# Patient Record
Sex: Female | Born: 1980 | Race: White | Hispanic: No | Marital: Married | Smoking: Former smoker
Health system: Southern US, Community
[De-identification: ages and names within clinical notes are randomized; demographics above are authoritative.]

## PROBLEM LIST (undated history)

## (undated) ENCOUNTER — Inpatient Hospital Stay (HOSPITAL_COMMUNITY): Payer: Self-pay

## (undated) DIAGNOSIS — K829 Disease of gallbladder, unspecified: Secondary | ICD-10-CM

## (undated) DIAGNOSIS — D649 Anemia, unspecified: Secondary | ICD-10-CM

## (undated) DIAGNOSIS — R7303 Prediabetes: Secondary | ICD-10-CM

## (undated) DIAGNOSIS — Z3490 Encounter for supervision of normal pregnancy, unspecified, unspecified trimester: Secondary | ICD-10-CM

## (undated) DIAGNOSIS — N949 Unspecified condition associated with female genital organs and menstrual cycle: Secondary | ICD-10-CM

## (undated) DIAGNOSIS — IMO0002 Reserved for concepts with insufficient information to code with codable children: Secondary | ICD-10-CM

## (undated) DIAGNOSIS — Z8719 Personal history of other diseases of the digestive system: Secondary | ICD-10-CM

## (undated) DIAGNOSIS — E079 Disorder of thyroid, unspecified: Secondary | ICD-10-CM

## (undated) DIAGNOSIS — N926 Irregular menstruation, unspecified: Secondary | ICD-10-CM

## (undated) DIAGNOSIS — R87629 Unspecified abnormal cytological findings in specimens from vagina: Secondary | ICD-10-CM

## (undated) DIAGNOSIS — R87619 Unspecified abnormal cytological findings in specimens from cervix uteri: Secondary | ICD-10-CM

## (undated) DIAGNOSIS — F419 Anxiety disorder, unspecified: Secondary | ICD-10-CM

## (undated) DIAGNOSIS — N898 Other specified noninflammatory disorders of vagina: Secondary | ICD-10-CM

## (undated) DIAGNOSIS — Z319 Encounter for procreative management, unspecified: Secondary | ICD-10-CM

## (undated) DIAGNOSIS — K219 Gastro-esophageal reflux disease without esophagitis: Secondary | ICD-10-CM

## (undated) HISTORY — DX: Unspecified condition associated with female genital organs and menstrual cycle: N94.9

## (undated) HISTORY — PX: WISDOM TOOTH EXTRACTION: SHX21

## (undated) HISTORY — DX: Disorder of thyroid, unspecified: E07.9

## (undated) HISTORY — DX: Disease of gallbladder, unspecified: K82.9

## (undated) HISTORY — DX: Unspecified abnormal cytological findings in specimens from cervix uteri: R87.619

## (undated) HISTORY — DX: Prediabetes: R73.03

## (undated) HISTORY — DX: Anemia, unspecified: D64.9

## (undated) HISTORY — DX: Encounter for procreative management, unspecified: Z31.9

## (undated) HISTORY — PX: UPPER GASTROINTESTINAL ENDOSCOPY: SHX188

## (undated) HISTORY — DX: Irregular menstruation, unspecified: N92.6

## (undated) HISTORY — DX: Other specified noninflammatory disorders of vagina: N89.8

## (undated) HISTORY — DX: Gastro-esophageal reflux disease without esophagitis: K21.9

## (undated) HISTORY — DX: Encounter for supervision of normal pregnancy, unspecified, unspecified trimester: Z34.90

## (undated) HISTORY — DX: Unspecified abnormal cytological findings in specimens from vagina: R87.629

## (undated) HISTORY — DX: Reserved for concepts with insufficient information to code with codable children: IMO0002

---

## 2007-03-07 ENCOUNTER — Other Ambulatory Visit: Admission: RE | Admit: 2007-03-07 | Discharge: 2007-03-07 | Payer: Self-pay | Admitting: Obstetrics and Gynecology

## 2007-12-03 ENCOUNTER — Emergency Department (HOSPITAL_COMMUNITY): Admission: EM | Admit: 2007-12-03 | Discharge: 2007-12-04 | Payer: Self-pay | Admitting: Emergency Medicine

## 2008-04-02 ENCOUNTER — Other Ambulatory Visit: Admission: RE | Admit: 2008-04-02 | Discharge: 2008-04-02 | Payer: Self-pay | Admitting: Obstetrics and Gynecology

## 2008-10-22 ENCOUNTER — Emergency Department (HOSPITAL_COMMUNITY): Admission: EM | Admit: 2008-10-22 | Discharge: 2008-10-22 | Payer: Self-pay | Admitting: Emergency Medicine

## 2009-06-18 ENCOUNTER — Other Ambulatory Visit: Admission: RE | Admit: 2009-06-18 | Discharge: 2009-06-18 | Payer: Self-pay | Admitting: Obstetrics and Gynecology

## 2010-10-08 LAB — URINALYSIS, ROUTINE W REFLEX MICROSCOPIC
Bilirubin Urine: NEGATIVE
Hgb urine dipstick: NEGATIVE
Nitrite: NEGATIVE
Specific Gravity, Urine: <1.005 — ABNORMAL LOW (ref 1.005–1.030)
Urobilinogen, UA: 0.2 mg/dL (ref 0.0–1.0)
pH: 6.5 (ref 5.0–8.0)

## 2010-11-12 ENCOUNTER — Other Ambulatory Visit (HOSPITAL_COMMUNITY)
Admission: RE | Admit: 2010-11-12 | Discharge: 2010-11-12 | Disposition: A | Discharge status: Home / Self Care | Payer: BC Managed Care – PPO | Source: Ambulatory Visit | Attending: Obstetrics and Gynecology | Admitting: Obstetrics and Gynecology

## 2010-11-12 DIAGNOSIS — Z01419 Encounter for gynecological examination (general) (routine) without abnormal findings: Secondary | ICD-10-CM | POA: Insufficient documentation

## 2011-03-26 LAB — DIFFERENTIAL
Eosinophils Relative: 3
Lymphocytes Relative: 31
Monocytes Absolute: 0.6
Monocytes Relative: 7
Neutro Abs: 5

## 2011-03-26 LAB — BASIC METABOLIC PANEL
CO2: 25
Calcium: 9.4
GFR calc Af Amer: >60
GFR calc non Af Amer: >60
Glucose, Bld: 109 — ABNORMAL HIGH
Potassium: 3.7
Sodium: 139

## 2011-03-26 LAB — CBC
HCT: 35.8 — ABNORMAL LOW
Hemoglobin: 12.8
RBC: 4.16

## 2011-12-30 ENCOUNTER — Other Ambulatory Visit (HOSPITAL_COMMUNITY)
Admission: RE | Admit: 2011-12-30 | Discharge: 2011-12-30 | Disposition: A | Discharge status: Home / Self Care | Payer: BC Managed Care – PPO | Source: Ambulatory Visit | Attending: Obstetrics and Gynecology | Admitting: Obstetrics and Gynecology

## 2011-12-30 DIAGNOSIS — Z1159 Encounter for screening for other viral diseases: Secondary | ICD-10-CM | POA: Insufficient documentation

## 2011-12-30 DIAGNOSIS — Z01419 Encounter for gynecological examination (general) (routine) without abnormal findings: Secondary | ICD-10-CM | POA: Insufficient documentation

## 2012-06-20 NOTE — H&P (Signed)
  NTS SOAP Note  Vital Signs:  Vitals as of: 06/20/2012: Systolic 136: Diastolic 88: Heart Rate 83: Temp 97.17F: Height 71ft 2in: Weight 114Lbs 0 Ounces: Pain Level 4: BMI 21  BMI : 20.85 kg/m2  Subjective: This 31 Years 65 Months old Female presents for of    ABDOMINAL PAIN : ,Has been having right upper quadrant abdominal pain radiating to the right flank for some time now.  Made worse with fatty food.  No fever, chills, jaundice.  U/S of gallbladder negative.  HIDA scan shows low gallbladder ejection fraction.  Review of Symptoms:  Constitutional:unremarkable   Head:unremarkable    Eyes:unremarkable   sinus problems Cardiovascular:  unremarkable   Respiratory:unremarkable   Gastrointestin    abdominal pain,nausea,vomiting,heartburn Genitourinary:unremarkable     Musculoskeletal:unremarkable   Skin:unremarkable Hematolgic/Lymphatic:unremarkable     Allergic/Immunologic:unremarkable     Past Medical History:    Reviewed   Past Medical History  Surgical History: none Medical Problems: reflux Allergies: pcn Medications: flonase, birth control, zantac, ativan   Social History:Reviewed  Social History  Age: 31 Years 6 Months Alcohol: socially Recreational drug(s):  No   Smoking Status: Never smoker reviewed on 06/16/2012 Functional Status reviewed on mm/dd/yyyy ------------------------------------------------ Bathing: Normal Cooking: Normal Dressing: Normal Driving: Normal Eating: Normal Managing Meds: Normal Oral Care: Normal Shopping: Normal Toileting: Normal Transferring: Normal Walking: Normal Cognitive Status reviewed on mm/dd/yyyy ------------------------------------------------ Attention: Normal Decision Making: Normal Language: Normal Memory: Normal Motor: Normal Perception: Normal Problem Solving: Normal Visual and Spatial: Normal   Family History:  Reviewed   Family History  Is there a family  history of:DM    Objective Information: General:  Well appearing, well nourished in no distress.   no scleral icterus Heart:  RRR, no murmur Lungs:    CTA bilaterally, no wheezes, rhonchi, rales.  Breathing unlabored. Abdomen:Soft, NT/ND, no HSM, no masses.  Assessment:Chronic cholecystitis  Diagnosis &amp; Procedure:    Plan:  Scheduled for laparoscopic cholecystectomy on 07/20/12.   Patient Education:Alternative treatments to surgery were discussed with patient (and family).  Risks and benefits  of procedure including bleeding, infection, hepatobiliary injury, and the possibility of an open procedure were fully explained to the patient (and family) who gave informed consent. Patient/family questions were addressed.  Follow-up:Pending Surgery

## 2012-07-05 ENCOUNTER — Encounter (HOSPITAL_COMMUNITY): Payer: Self-pay | Admitting: Pharmacy Technician

## 2012-07-14 ENCOUNTER — Encounter (HOSPITAL_COMMUNITY): Payer: Self-pay

## 2012-07-14 ENCOUNTER — Encounter (HOSPITAL_COMMUNITY)
Admission: RE | Admit: 2012-07-14 | Discharge: 2012-07-14 | Disposition: A | Discharge status: Home / Self Care | Payer: BC Managed Care – PPO | Source: Ambulatory Visit | Attending: General Surgery | Admitting: General Surgery

## 2012-07-14 DIAGNOSIS — Z01812 Encounter for preprocedural laboratory examination: Secondary | ICD-10-CM | POA: Insufficient documentation

## 2012-07-14 DIAGNOSIS — K811 Chronic cholecystitis: Secondary | ICD-10-CM | POA: Insufficient documentation

## 2012-07-14 DIAGNOSIS — Z5320 Procedure and treatment not carried out because of patient's decision for unspecified reasons: Secondary | ICD-10-CM | POA: Insufficient documentation

## 2012-07-14 HISTORY — DX: Anxiety disorder, unspecified: F41.9

## 2012-07-14 HISTORY — DX: Gastro-esophageal reflux disease without esophagitis: K21.9

## 2012-07-14 HISTORY — DX: Personal history of other diseases of the digestive system: Z87.19

## 2012-07-14 LAB — BASIC METABOLIC PANEL
BUN: 11 mg/dL (ref 6–23)
CO2: 26 mEq/L (ref 19–32)
Chloride: 100 mEq/L (ref 96–112)
Creatinine, Ser: 0.94 mg/dL (ref 0.50–1.10)
Glucose, Bld: 103 mg/dL — ABNORMAL HIGH (ref 70–99)

## 2012-07-14 LAB — CBC WITH DIFFERENTIAL/PLATELET
Basophils Absolute: 0 10*3/uL (ref 0.0–0.1)
Eosinophils Absolute: 0.4 10*3/uL (ref 0.0–0.7)
Eosinophils Relative: 5 % (ref 0–5)
HCT: 39 % (ref 36.0–46.0)
Lymphocytes Relative: 27 % (ref 12–46)
MCH: 29.4 pg (ref 26.0–34.0)
MCV: 86.3 fL (ref 78.0–100.0)
Monocytes Absolute: 0.7 10*3/uL (ref 0.1–1.0)
RDW: 12.8 % (ref 11.5–15.5)
WBC: 8.9 10*3/uL (ref 4.0–10.5)

## 2012-07-14 LAB — HEPATIC FUNCTION PANEL
ALT: 9 U/L (ref 0–35)
AST: 14 U/L (ref 0–37)
Albumin: 3.9 g/dL (ref 3.5–5.2)
Alkaline Phosphatase: 66 U/L (ref 39–117)
Total Protein: 7 g/dL (ref 6.0–8.3)

## 2012-07-14 LAB — HCG, SERUM, QUALITATIVE: Preg, Serum: NEGATIVE

## 2012-07-14 NOTE — Patient Instructions (Addendum)
GAYTHA RAYBOURN  07/14/2012   Your procedure is scheduled on:  07/20/2012  Report to [facility] at  700  AM.  Call this number if you have problems the morning of surgery: [phone]   Remember:   Do not eat food or drink liquids after midnight.   Take these medicines the morning of surgery with A SIP OF WATER: ativan,prilosec,zantac   Do not wear jewelry, make-up or nail polish.  Do not wear lotions, powders, or perfumes.   Do not shave 48 hours prior to surgery. Men may shave face and neck.  Do not bring valuables to the hospital.  Contacts, dentures or bridgework may not be worn into surgery.  Leave suitcase in the car. After surgery it may be brought to your room.  For patients admitted to the hospital, checkout time is 11:00 AM the day of discharge.   Patients discharged the day of surgery will not be allowed to drive  home.  Name and phone number of your driver: family  Special Instructions: Shower using CHG 2 nights before surgery and the night before surgery.  If you shower the day of surgery use CHG.  Use special wash - you have one bottle of CHG for all showers.  You should use approximately 1/3 of the bottle for each shower.   Please read over the following fact sheets that you were given: Pain Booklet, Coughing and Deep Breathing, MRSA Information, Surgical Site Infection Prevention, Anesthesia Post-op Instructions and Care and Recovery After Surgery Laparoscopic Cholecystectomy Laparoscopic cholecystectomy is surgery to remove the gallbladder. The gallbladder is located slightly to the right of center in the abdomen, behind the liver. It is a concentrating and storage sac for the bile produced in the liver. Bile aids in the digestion and absorption of fats. Gallbladder disease (cholecystitis) is an inflammation of your gallbladder. This condition is usually caused by a buildup of gallstones (cholelithiasis) in your gallbladder. Gallstones can block the flow of bile,  resulting in inflammation and pain. In severe cases, emergency surgery may be required. When emergency surgery is not required, you will have time to prepare for the procedure. Laparoscopic surgery is an alternative to open surgery. Laparoscopic surgery usually has a shorter recovery time. Your common bile duct may also need to be examined and explored. Your caregiver will discuss this with you if he or she feels this should be done. If stones are found in the common bile duct, they may be removed. LET YOUR CAREGIVER KNOW ABOUT:  Allergies to food or medicine.  Medicines taken, including vitamins, herbs, eyedrops, over-the-counter medicines, and creams.  Use of steroids (by mouth or creams).  Previous problems with anesthetics or numbing medicines.  History of bleeding problems or blood clots.  Previous surgery.  Other health problems, including diabetes and kidney problems.  Possibility of pregnancy, if this applies. RISKS AND COMPLICATIONS All surgery is associated with risks. Some problems that may occur following this procedure include:  Infection.  Damage to the common bile duct, nerves, arteries, veins, or other internal organs such as the stomach or intestines.  Bleeding.  A stone may remain in the common bile duct. BEFORE THE PROCEDURE  Do not take aspirin for 3 days prior to surgery or blood thinners for 1 week prior to surgery.  Do not eat or drink anything after midnight the night before surgery.  Let your caregiver know if you develop a cold or other infectious problem prior to surgery.  You should be present 60 minutes before the procedure or as directed. PROCEDURE  You will be given medicine that makes you sleep (general anesthetic). When you are asleep, your surgeon will make several small cuts (incisions) in your abdomen. One of these incisions is used to insert a small, lighted scope (laparoscope) into the abdomen. The laparoscope helps the surgeon see into  your abdomen. Carbon dioxide gas will be pumped into your abdomen. The gas allows more room for the surgeon to perform your surgery. Other operating instruments are inserted through the other incisions. Laparoscopic procedures may not be appropriate when:  There is major scarring from previous surgery.  The gallbladder is extremely inflamed.  There are bleeding disorders or unexpected cirrhosis of the liver.  A pregnancy is near term.  Other conditions make the laparoscopic procedure impossible. If your surgeon feels it is not safe to continue with a laparoscopic procedure, he or she will perform an open abdominal procedure. In this case, the surgeon will make an incision to open the abdomen. This gives the surgeon a larger view and field to work within. This may allow the surgeon to perform procedures that sometimes cannot be performed with a laparoscope alone. Open surgery has a longer recovery time. AFTER THE PROCEDURE  You will be taken to the recovery area where a nurse will watch and check your progress.  You may be allowed to go home the same day.  Do not resume physical activities until directed by your caregiver.  You may resume a normal diet and activities as directed. Document Released: 06/15/2005 Document Revised: 09/07/2011 Document Reviewed: 11/28/2010 [facility] Patient Information 2013 [place], [place]. PATIENT INSTRUCTIONS POST-ANESTHESIA  IMMEDIATELY FOLLOWING SURGERY:  Do not drive or operate machinery for the first twenty four hours after surgery.  Do not make any important decisions for twenty four hours after surgery or while taking narcotic pain medications or sedatives.  If you develop intractable nausea and vomiting or a severe headache please notify your doctor immediately.  FOLLOW-UP:  Please make an appointment with your surgeon as instructed. You do not need to follow up with anesthesia unless specifically instructed to do so.  WOUND CARE INSTRUCTIONS (if  applicable):  Keep a dry clean dressing on the anesthesia/puncture wound site if there is drainage.  Once the wound has quit draining you may leave it open to air.  Generally you should leave the bandage intact for twenty four hours unless there is drainage.  If the epidural site drains for more than 36-48 hours please call the anesthesia department.  QUESTIONS?:  Please feel free to call your physician or the hospital operator if you have any questions, and they will be happy to assist you.

## 2012-07-19 NOTE — OR Nursing (Signed)
Pt. Came for PAT workup. Then decided that she wants to wait until Summer to have procedure done.

## 2012-07-20 ENCOUNTER — Ambulatory Visit (HOSPITAL_COMMUNITY): Admission: RE | Admit: 2012-07-20 | Payer: BC Managed Care – PPO | Source: Ambulatory Visit | Admitting: General Surgery

## 2012-07-20 SURGERY — LAPAROSCOPIC CHOLECYSTECTOMY
Anesthesia: General

## 2013-01-02 ENCOUNTER — Other Ambulatory Visit: Payer: Self-pay | Admitting: Adult Health

## 2013-02-02 ENCOUNTER — Other Ambulatory Visit: Payer: Self-pay | Admitting: Adult Health

## 2013-03-16 ENCOUNTER — Other Ambulatory Visit: Payer: Self-pay | Admitting: Adult Health

## 2013-03-28 ENCOUNTER — Ambulatory Visit (INDEPENDENT_AMBULATORY_CARE_PROVIDER_SITE_OTHER): Payer: BC Managed Care – PPO | Admitting: Adult Health

## 2013-03-28 ENCOUNTER — Encounter: Payer: Self-pay | Admitting: Adult Health

## 2013-03-28 VITALS — BP 120/80 | HR 70 | Ht 62.0 in | Wt 114.0 lb

## 2013-03-28 DIAGNOSIS — Z01419 Encounter for gynecological examination (general) (routine) without abnormal findings: Secondary | ICD-10-CM

## 2013-03-28 NOTE — Patient Instructions (Addendum)
Physical in 1 year Take folic acid 800 mcg

## 2013-03-28 NOTE — Progress Notes (Signed)
Patient ID: Mindy Bright, female   DOB: 1980/09/16, 32 y.o.   MRN: [id number] History of Present Illness: Mindy Bright is a 32 year old white female marred in for physical. Had normal pap with negative HPV on 12/29/12.  Current Medications, Allergies, Past Medical History, Past Surgical History, Family History and Social History were reviewed in [employer] record.     Review of Systems: Patient denies any headaches, blurred vision, shortness of breath, chest pain, abdominal pain, problems with bowel movements, urination, or intercourse. No joint pain or mood changes,has been off OCs since June,ok if gets pregnant.    Physical Exam:BP 120/80  Pulse 70  Ht 5\' 2"  (1.575 m)  Wt 114 lb (51.71 kg)  BMI 20.85 kg/m2  LMP 03/14/2013 General:  Well developed, well nourished, no acute distress Skin:  Warm and dry Neck:  Midline trachea, normal thyroid Lungs; Clear to auscultation bilaterally Breast:  No dominant palpable mass, retraction, or nipple discharge Cardiovascular: Regular rate and rhythm Abdomen:  Soft, non tender, no hepatosplenomegaly Pelvic:  External genitalia is normal in appearance.  The vagina is normal in appearance. The cervix is nulliparous.  Uterus is felt to be normal size, shape, and contour.  No adnexal masses or tenderness noted. Extremities:  No swelling or varicosities noted Psych:  No mood changes, alert and cooperative seems happy   Impression: Yearly exam no pap     Plan: Physical in 1 year  Labs in near future fasting Take 800 mcg folic acid

## 2013-12-11 ENCOUNTER — Ambulatory Visit (INDEPENDENT_AMBULATORY_CARE_PROVIDER_SITE_OTHER): Payer: BC Managed Care – PPO | Admitting: Adult Health

## 2013-12-11 ENCOUNTER — Encounter: Payer: Self-pay | Admitting: Adult Health

## 2013-12-11 VITALS — BP 104/56 | Ht 62.0 in | Wt 115.0 lb

## 2013-12-11 DIAGNOSIS — N949 Unspecified condition associated with female genital organs and menstrual cycle: Secondary | ICD-10-CM

## 2013-12-11 DIAGNOSIS — Z319 Encounter for procreative management, unspecified: Secondary | ICD-10-CM

## 2013-12-11 DIAGNOSIS — N898 Other specified noninflammatory disorders of vagina: Secondary | ICD-10-CM

## 2013-12-11 HISTORY — DX: Encounter for procreative management, unspecified: Z31.9

## 2013-12-11 HISTORY — DX: Unspecified condition associated with female genital organs and menstrual cycle: N94.9

## 2013-12-11 HISTORY — DX: Other specified noninflammatory disorders of vagina: N89.8

## 2013-12-11 LAB — POCT WET PREP (WET MOUNT): WBC WET PREP: POSITIVE

## 2013-12-11 NOTE — Progress Notes (Signed)
Subjective:     Patient ID: Mindy Bright, female   DOB: Aug 03, 1980, 33 y.o.   MRN: [id number]  HPI Mindy KaufmannMelissa is a 33 year old white female, married in complaining of pressure/pain right ovary with orgasm,x 3-4 months,pain in ovary area with periods.Has discharge, no itching or odor,she wants to get pregnant in August.  Review of Systems See HPi Reviewed past medical,surgical, social and family history. Reviewed medications and allergies.     Objective:   Physical Exam BP 104/56  Ht 5\' 2"  (1.575 m)  Wt 115 lb (52.164 kg)  BMI 21.03 kg/m2  LMP 11/20/2013   Skin warm and dry.Pelvic: external genitalia is normal in appearance, vagina: white discharge without odor, cervix:smooth, uterus: normal size, shape and contour, non tender, no masses felt, adnexa: no masses or tenderness noted. Wet prep: + WBC Discussed that with orgasm can have spasms.   Assessment:     Pelvic pain  Vaginal discharge Desires pregnancy    Plan:     Return in 1 week for US and see me Take 800 mcg folic acid Discussed timing of sex and that could take 6-18 months of trying to get pregnant

## 2013-12-11 NOTE — Patient Instructions (Signed)
Pelvic Pain, Female Female pelvic pain can be caused by many different things and start from a variety of places. Pelvic pain refers to pain that is located in the lower half of the abdomen and between your hips. The pain may occur over a short period of time (acute) or may be reoccurring (chronic). The cause of pelvic pain may be related to disorders affecting the female reproductive organs (gynecologic), but it may also be related to the bladder, kidney stones, an intestinal complication, or muscle or skeletal problems. Getting help right away for pelvic pain is important, especially if there has been severe, sharp, or a sudden onset of unusual pain. It is also important to get help right away because some types of pelvic pain can be life threatening.  CAUSES  Below are only some of the causes of pelvic pain. The causes of pelvic pain can be in one of several categories.   Gynecologic.  Pelvic inflammatory disease.  Sexually transmitted infection.  Ovarian cyst or a twisted ovarian ligament (ovarian torsion).  Uterine lining that grows outside the uterus (endometriosis).  Fibroids, cysts, or tumors.  Ovulation.  Pregnancy.  Pregnancy that occurs outside the uterus (ectopic pregnancy).  Miscarriage.  Labor.  Abruption of the placenta or ruptured uterus.  Infection.  Uterine infection (endometritis).  Bladder infection.  Diverticulitis.  Miscarriage related to a uterine infection (septic abortion).  Bladder.  Inflammation of the bladder (cystitis).  Kidney stone(s).  Gastrointenstinal.  Constipation.  Diverticulitis.  Neurologic.  Trauma.  Feeling pelvic pain because of mental or emotional causes (psychosomatic).  Cancers of the bowel or pelvis. EVALUATION  Your caregiver will want to take a careful history of your concerns. This includes recent changes in your health, a careful gynecologic history of your periods (menses), and a sexual history. Obtaining  your family history and medical history is also important. Your caregiver may suggest a pelvic exam. A pelvic exam will help identify the location and severity of the pain. It also helps in the evaluation of which organ system may be involved. In order to identify the cause of the pelvic pain and be properly treated, your caregiver may order tests. These tests may include:   A pregnancy test.  Pelvic ultrasonography.  An X-ray exam of the abdomen.  A urinalysis or evaluation of vaginal discharge.  Blood tests. HOME CARE INSTRUCTIONS   Only take over-the-counter or prescription medicines for pain, discomfort, or fever as directed by your caregiver.   Rest as directed by your caregiver.   Eat a balanced diet.   Drink enough fluids to make your urine clear or pale yellow, or as directed.   Avoid sexual intercourse if it causes pain.   Apply warm or cold compresses to the lower abdomen depending on which one helps the pain.   Avoid stressful situations.   Keep a journal of your pelvic pain. Write down when it started, where the pain is located, and if there are things that seem to be associated with the pain, such as food or your menstrual cycle.  Follow up with your caregiver as directed.  SEEK MEDICAL CARE IF:  Your medicine does not help your pain.  You have abnormal vaginal discharge. SEEK IMMEDIATE MEDICAL CARE IF:   You have heavy bleeding from the vagina.   Your pelvic pain increases.   You feel lightheaded or faint.   You have chills.   You have pain with urination or blood in your urine.   You have uncontrolled  diarrhea or vomiting.   You have a fever or persistent symptoms for more than 3 days.  You have a fever and your symptoms suddenly get worse.   You are being physically or sexually abused.  MAKE SURE YOU:  Understand these instructions.  Will watch your condition.  Will get help if you are not doing well or get worse. Document  Released: 05/12/2004 Document Revised: 12/15/2011 Document Reviewed: 10/05/2011 [facility] Patient Information 2014 [place]. Return in 1 week for US and see me

## 2013-12-19 ENCOUNTER — Other Ambulatory Visit: Payer: Self-pay | Admitting: Adult Health

## 2013-12-19 DIAGNOSIS — IMO0002 Reserved for concepts with insufficient information to code with codable children: Secondary | ICD-10-CM

## 2013-12-19 DIAGNOSIS — N949 Unspecified condition associated with female genital organs and menstrual cycle: Secondary | ICD-10-CM

## 2013-12-20 ENCOUNTER — Other Ambulatory Visit: Payer: BC Managed Care – PPO

## 2013-12-20 ENCOUNTER — Ambulatory Visit: Payer: BC Managed Care – PPO | Admitting: Adult Health

## 2013-12-27 ENCOUNTER — Encounter: Payer: Self-pay | Admitting: Adult Health

## 2013-12-27 ENCOUNTER — Ambulatory Visit (INDEPENDENT_AMBULATORY_CARE_PROVIDER_SITE_OTHER): Payer: BC Managed Care – PPO | Admitting: Adult Health

## 2013-12-27 ENCOUNTER — Ambulatory Visit (INDEPENDENT_AMBULATORY_CARE_PROVIDER_SITE_OTHER): Payer: BC Managed Care – PPO

## 2013-12-27 VITALS — BP 100/76 | Ht 62.0 in | Wt 114.0 lb

## 2013-12-27 DIAGNOSIS — Z319 Encounter for procreative management, unspecified: Secondary | ICD-10-CM

## 2013-12-27 DIAGNOSIS — N949 Unspecified condition associated with female genital organs and menstrual cycle: Secondary | ICD-10-CM

## 2013-12-27 DIAGNOSIS — IMO0002 Reserved for concepts with insufficient information to code with codable children: Secondary | ICD-10-CM

## 2013-12-27 NOTE — Progress Notes (Signed)
Subjective:     Patient ID: Mindy DadaMelissa C Bright, female   DOB: 1981-03-08, 33 y.o.   MRN: [id number]  HPI Mindy Bright is a 33 year old white female in for US has had some pelvic pain and wants to get pregnant.  Review of Systems See HPI Reviewed past medical,surgical, social and family history. Reviewed medications and allergies.     Objective:   Physical Exam BP 100/76  Ht 5\' 2"  (1.575 m)  Wt 114 lb (51.71 kg)  BMI 20.85 kg/m2  LMP 06/24/2015reviewed US:   Uterus 7.0 x 5.0 x 3.7 cm, retroverted no myometrial masses noted  Endometrium 5.1 mm, symmetrical,  Right ovary 2.5 x 2.1 x 1.9 cm, +follicles noted  Left ovary 2.0 x 1.7 x 1.1 cm, +follicles noted  Small amount of free fluid noted within the posterior cul-de-sac  Technician Comments:  Retroverted uterus, Endom-5.351mm symmetrical, bilateral adnexa appears WNL, small amount of free fluid noted within the posterior cul-de-sac     Assessment:     Pelvic pain has follicles on ovaries Desires pregnancy    Plan:    Continue folic acid or prenatal vitamin  Discussed timing of sex and ovualtion Follow up prn

## 2013-12-27 NOTE — Patient Instructions (Signed)
Follow up prn

## 2014-04-23 ENCOUNTER — Telehealth: Payer: Self-pay | Admitting: Adult Health

## 2014-04-23 NOTE — Telephone Encounter (Signed)
Spoke with pt. Pt is trying to get pregnant. Her periods are coming earlier and earlier each month. Has been off birth control x 1 year. I advised it would be best for her to come in and talk to JAG. Pt voiced understanding. Pt to call back after checking her schedule. JSY

## 2014-04-30 ENCOUNTER — Encounter: Payer: Self-pay | Admitting: Adult Health

## 2014-04-30 ENCOUNTER — Ambulatory Visit (INDEPENDENT_AMBULATORY_CARE_PROVIDER_SITE_OTHER): Payer: BC Managed Care – PPO | Admitting: Adult Health

## 2014-04-30 VITALS — BP 110/70 | Ht 62.0 in | Wt 119.0 lb

## 2014-04-30 DIAGNOSIS — Z319 Encounter for procreative management, unspecified: Secondary | ICD-10-CM

## 2014-04-30 DIAGNOSIS — N926 Irregular menstruation, unspecified: Secondary | ICD-10-CM

## 2014-04-30 HISTORY — DX: Irregular menstruation, unspecified: N92.6

## 2014-04-30 LAB — POCT URINE PREGNANCY: PREG TEST UR: NEGATIVE

## 2014-04-30 NOTE — Progress Notes (Signed)
Subjective:     Patient ID: Mindy Bright, female   DOB: 07/16/1980, 33 y.o.   MRN: [id number]  HPI Mindy Bright is a 33 year old white female, in to discuss periods,they have been regular up until August and started then to try to get pregnant.Had period august 25 then Sept.21 then October 10 and October 25, and has had some discomfort with sex and occasional odor after sex.  Review of Systems See HPI Reviewed past medical,surgical, social and family history. Reviewed medications and allergies.     Objective:   Physical Exam BP 110/70 mmHg  Ht _0  (1.575 m)  Wt 119 lb (53.978 kg)  BMI 21.76 kg/m2  LMP 04/22/2014 (Exact Date)UPT negative,Skin warm and dry.Pelvic: external genitalia is normal in appearance, vagina: scant discharge without odor, cervix:smooth, uterus: normal size, shape and contour, non tender, no masses felt, adnexa: no masses or tenderness noted.Discussed ovulation and timing of sex and that odor can be change in pH.    Assessment:     Irregular periods Desires pregnancy    Plan:     Have sex every other day Call with next period or not, may encourage to get ovulation detector kit, if has period this month

## 2014-04-30 NOTE — Patient Instructions (Signed)
Have sex every other day, call with next period

## 2014-06-29 NOTE — L&D Delivery Note (Signed)
Edwin DadaMelissa C Mcwethy is a 34 y.o. female G1P0 with IUP at 312w0d presenting for active labor with PROM @ 6:45 PM. Patient's labor progressed without augmentation. While pushing, patient's contraction spaced out and descent was slow and therefore Pitocin was started. Pitocin stopped prior to delivery related to variable decelerations.  Dr Despina HiddenEure called to room related to FHR status, slow progress, and noted ROT position.  Dr Despina HiddenEure assisted with fetal rotation and delivery occurred quickly afterwards without complication.   Delivery Note At 6:12 AM a viable female was delivered via Vaginal, Spontaneous Delivery (Presentation: Left Occiput Anterior).  APGAR: 7, 9; weight pending  Placenta status: Intact, Spontaneous.  Cord: 3 vessels with the following complications: None.  Cord pH: Not collected   Anesthesia: Epidural  Episiotomy: None Lacerations: 2nd degree perineal Suture Repair: 3.0 Monocryl Est. Blood Loss (mL): 50  Mom to postpartum.  Baby to Couplet care / Skin to Skin.  Danella Maierssiyah Z Mikell 06/15/2015, 6:54 AM   I was present for this delivery and agree with the above resident's note.  LEFTWICH-KIRBY, Fredrick Dray Certified Nurse-Midwife

## 2014-10-22 ENCOUNTER — Telehealth: Payer: Self-pay | Admitting: Obstetrics & Gynecology

## 2014-10-22 NOTE — Telephone Encounter (Signed)
I spoke with Dr Despina HiddenEure before contacting the pt and he advised that the pt could continue taking the omeprazole but that she would need to reschedule the dentist appointment, if routine, until after she is 14 weeks. He stated that even though it would be fine for her to go to the dentist, if anything happened with the pregnancy she would associate the two even though the two were not related.   I spoke with the pt and advised her of above. The pt stated that she is supposed to go in for a root canal. I advised the pt that if she felt she really needed to go that she could but if not to reschedule. The pt stated that she would reschedule and wait until after her first trimester. Pt was also advised that she could continue taking her omeprazole. Pt verbalized understanding.

## 2014-10-26 ENCOUNTER — Ambulatory Visit (INDEPENDENT_AMBULATORY_CARE_PROVIDER_SITE_OTHER): Payer: BLUE CROSS/BLUE SHIELD | Admitting: Adult Health

## 2014-10-26 ENCOUNTER — Encounter: Payer: Self-pay | Admitting: Adult Health

## 2014-10-26 VITALS — BP 128/70 | HR 84 | Ht 62.0 in | Wt 118.5 lb

## 2014-10-26 DIAGNOSIS — N926 Irregular menstruation, unspecified: Secondary | ICD-10-CM

## 2014-10-26 DIAGNOSIS — O3680X0 Pregnancy with inconclusive fetal viability, not applicable or unspecified: Secondary | ICD-10-CM

## 2014-10-26 DIAGNOSIS — Z3201 Encounter for pregnancy test, result positive: Secondary | ICD-10-CM

## 2014-10-26 DIAGNOSIS — Z3490 Encounter for supervision of normal pregnancy, unspecified, unspecified trimester: Secondary | ICD-10-CM

## 2014-10-26 DIAGNOSIS — Z3400 Encounter for supervision of normal first pregnancy, unspecified trimester: Secondary | ICD-10-CM | POA: Insufficient documentation

## 2014-10-26 HISTORY — DX: Encounter for supervision of normal pregnancy, unspecified, unspecified trimester: Z34.90

## 2014-10-26 LAB — POCT URINE PREGNANCY: Preg Test, Ur: POSITIVE

## 2014-10-26 NOTE — Progress Notes (Signed)
Subjective:     Patient ID: Mindy Bright, female   DOB: [birth year], 34 y.o.   MRN: [id number]  HPI Mindy Bright is a 34 year old white female, married in for having missed a periods and had 3 +HPT,she wants UPT.Has has some low stomach and back ache,no bleeding or urinary symptoms, no nausea.  Review of Systems +missed period, low stomach and back ache, all other systems negative  Reviewed past medical,surgical, social and family history. Reviewed medications and allergies.     Objective:   Physical Exam BP 128/70 mmHg  Pulse 84  Ht 5\' 2"  (1.575 m)  Wt 118 lb 8 oz (53.751 kg)  BMI 21.67 kg/m2  LMP 03/18/2016UPT +, about [redacted] weeks pregnant with EDD 06/21/15 by LMP, Skin warm and dry. Neck: mid line trachea, normal thyroid, good ROM, no lymphadenopathy noted. Lungs: clear to ausculation bilaterally. Cardiovascular: regular rate and rhythm.abdomen soft non tender, no HSM, no CVAT   She is PE teacher, ok to run, no heavy lifting.  Assessment:     Pregnant +UPT    Plan:     Return in 2 weeks for dating US  Review handout on first trimester and new OB packet

## 2014-10-26 NOTE — Patient Instructions (Signed)
First Trimester of Pregnancy The first trimester of pregnancy is from week 1 until the end of week 12 (months 1 through 3). A week after a sperm fertilizes an egg, the egg will implant on the wall of the uterus. This embryo will begin to develop into a baby. Genes from you and your partner are forming the baby. The female genes determine whether the baby is a boy or a girl. At 6-8 weeks, the eyes and face are formed, and the heartbeat can be seen on ultrasound. At the end of 12 weeks, all the baby's organs are formed.  Now that you are pregnant, you will want to do everything you can to have a healthy baby. Two of the most important things are to get good prenatal care and to follow your health care provider's instructions. Prenatal care is all the medical care you receive before the baby's birth. This care will help prevent, find, and treat any problems during the pregnancy and childbirth. BODY CHANGES Your body goes through many changes during pregnancy. The changes vary from woman to woman.   You may gain or lose a couple of pounds at first.  You may feel sick to your stomach (nauseous) and throw up (vomit). If the vomiting is uncontrollable, call your health care provider.  You may tire easily.  You may develop headaches that can be relieved by medicines approved by your health care provider.  You may urinate more often. Painful urination may mean you have a bladder infection.  You may develop heartburn as a result of your pregnancy.  You may develop constipation because certain hormones are causing the muscles that push waste through your intestines to slow down.  You may develop hemorrhoids or swollen, bulging veins (varicose veins).  Your breasts may begin to grow larger and become tender. Your nipples may stick out more, and the tissue that surrounds them (areola) may become darker.  Your gums may bleed and may be sensitive to brushing and flossing.  Dark spots or blotches (chloasma,  mask of pregnancy) may develop on your face. This will likely fade after the baby is born.  Your menstrual periods will stop.  You may have a loss of appetite.  You may develop cravings for certain kinds of food.  You may have changes in your emotions from day to day, such as being excited to be pregnant or being concerned that something may go wrong with the pregnancy and baby.  You may have more vivid and strange dreams.  You may have changes in your hair. These can include thickening of your hair, rapid growth, and changes in texture. Some women also have hair loss during or after pregnancy, or hair that feels dry or thin. Your hair will most likely return to normal after your baby is born. WHAT TO EXPECT AT YOUR PRENATAL VISITS During a routine prenatal visit:  You will be weighed to make sure you and the baby are growing normally.  Your blood pressure will be taken.  Your abdomen will be measured to track your baby's growth.  The fetal heartbeat will be listened to starting around week 10 or 12 of your pregnancy.  Test results from any previous visits will be discussed. Your health care provider may ask you:  How you are feeling.  If you are feeling the baby move.  If you have had any abnormal symptoms, such as leaking fluid, bleeding, severe headaches, or abdominal cramping.  If you have any questions. Other tests   that may be performed during your first trimester include:  Blood tests to find your blood type and to check for the presence of any previous infections. They will also be used to check for low iron levels (anemia) and Rh antibodies. Later in the pregnancy, blood tests for diabetes will be done along with other tests if problems develop.  Urine tests to check for infections, diabetes, or protein in the urine.  An ultrasound to confirm the proper growth and development of the baby.  An amniocentesis to check for possible genetic problems.  Fetal screens for  spina bifida and Down syndrome.  You may need other tests to make sure you and the baby are doing well. HOME CARE INSTRUCTIONS  Medicines  Follow your health care provider's instructions regarding medicine use. Specific medicines may be either safe or unsafe to take during pregnancy.  Take your prenatal vitamins as directed.  If you develop constipation, try taking a stool softener if your health care provider approves. Diet  Eat regular, well-balanced meals. Choose a variety of foods, such as meat or vegetable-based protein, fish, milk and low-fat dairy products, vegetables, fruits, and whole grain breads and cereals. Your health care provider will help you determine the amount of weight gain that is right for you.  Avoid raw meat and uncooked cheese. These carry germs that can cause birth defects in the baby.  Eating four or five small meals rather than three large meals a day may help relieve nausea and vomiting. If you start to feel nauseous, eating a few soda crackers can be helpful. Drinking liquids between meals instead of during meals also seems to help nausea and vomiting.  If you develop constipation, eat more high-fiber foods, such as fresh vegetables or fruit and whole grains. Drink enough fluids to keep your urine clear or pale yellow. Activity and Exercise  Exercise only as directed by your health care provider. Exercising will help you:  Control your weight.  Stay in shape.  Be prepared for labor and delivery.  Experiencing pain or cramping in the lower abdomen or low back is a good sign that you should stop exercising. Check with your health care provider before continuing normal exercises.  Try to avoid standing for long periods of time. Move your legs often if you must stand in one place for a long time.  Avoid heavy lifting.  Wear low-heeled shoes, and practice good posture.  You may continue to have sex unless your health care provider directs you  otherwise. Relief of Pain or Discomfort  Wear a good support bra for breast tenderness.   Take warm sitz baths to soothe any pain or discomfort caused by hemorrhoids. Use hemorrhoid cream if your health care provider approves.   Rest with your legs elevated if you have leg cramps or low back pain.  If you develop varicose veins in your legs, wear support hose. Elevate your feet for 15 minutes, 3-4 times a day. Limit salt in your diet. Prenatal Care  Schedule your prenatal visits by the twelfth week of pregnancy. They are usually scheduled monthly at first, then more often in the last 2 months before delivery.  Write down your questions. Take them to your prenatal visits.  Keep all your prenatal visits as directed by your health care provider. Safety  Wear your seat belt at all times when driving.  Make a list of emergency phone numbers, including numbers for family, friends, the hospital, and police and fire departments. General Tips    Ask your health care provider for a referral to a local prenatal education class. Begin classes no later than at the beginning of month 6 of your pregnancy.  Ask for help if you have counseling or nutritional needs during pregnancy. Your health care provider can offer advice or refer you to specialists for help with various needs.  Do not use hot tubs, steam rooms, or saunas.  Do not douche or use tampons or scented sanitary pads.  Do not cross your legs for long periods of time.  Avoid cat litter boxes and soil used by cats. These carry germs that can cause birth defects in the baby and possibly loss of the fetus by miscarriage or stillbirth.  Avoid all smoking, herbs, alcohol, and medicines not prescribed by your health care provider. Chemicals in these affect the formation and growth of the baby.  Schedule a dentist appointment. At home, brush your teeth with a soft toothbrush and be gentle when you floss. SEEK MEDICAL CARE IF:   You have  dizziness.  You have mild pelvic cramps, pelvic pressure, or nagging pain in the abdominal area.  You have persistent nausea, vomiting, or diarrhea.  You have a bad smelling vaginal discharge.  You have pain with urination.  You notice increased swelling in your face, hands, legs, or ankles. SEEK IMMEDIATE MEDICAL CARE IF:   You have a fever.  You are leaking fluid from your vagina.  You have spotting or bleeding from your vagina.  You have severe abdominal cramping or pain.  You have rapid weight gain or loss.  You vomit blood or material that looks like coffee grounds.  You are exposed to German measles and have never had them.  You are exposed to fifth disease or chickenpox.  You develop a severe headache.  You have shortness of breath.  You have any kind of trauma, such as from a fall or a car accident. Document Released: 06/09/2001 Document Revised: 10/30/2013 Document Reviewed: 04/25/2013 ExitCare Patient Information 2015 [employer]. This information is not intended to replace advice given to you by your health care provider. Make sure you discuss any questions you have with your health care provider. Return in 2 weeks for dating US 

## 2014-11-08 ENCOUNTER — Telehealth: Payer: Self-pay | Admitting: Obstetrics & Gynecology

## 2014-11-08 NOTE — Telephone Encounter (Signed)
Pt states has an appt tomorrow for ultrasound, about [redacted] weeks pregnant. Pt states she has had gallbladder problems in the past and feels she is starting to have the same symptoms. Pt states isn't sure if pregnancy related or gallbladder. Pt given an appt with Cyril MourningJennifer Griffin, NP after her U/S. Pt verbalized understanding.

## 2014-11-09 ENCOUNTER — Encounter: Payer: Self-pay | Admitting: Adult Health

## 2014-11-09 ENCOUNTER — Ambulatory Visit (INDEPENDENT_AMBULATORY_CARE_PROVIDER_SITE_OTHER): Payer: BLUE CROSS/BLUE SHIELD

## 2014-11-09 ENCOUNTER — Ambulatory Visit (INDEPENDENT_AMBULATORY_CARE_PROVIDER_SITE_OTHER): Payer: BLUE CROSS/BLUE SHIELD | Admitting: Adult Health

## 2014-11-09 VITALS — BP 100/80 | HR 68 | Ht 62.0 in | Wt 118.0 lb

## 2014-11-09 DIAGNOSIS — O3680X0 Pregnancy with inconclusive fetal viability, not applicable or unspecified: Secondary | ICD-10-CM | POA: Diagnosis not present

## 2014-11-09 DIAGNOSIS — R1010 Upper abdominal pain, unspecified: Secondary | ICD-10-CM

## 2014-11-09 DIAGNOSIS — K219 Gastro-esophageal reflux disease without esophagitis: Secondary | ICD-10-CM | POA: Insufficient documentation

## 2014-11-09 DIAGNOSIS — IMO0001 Reserved for inherently not codable concepts without codable children: Secondary | ICD-10-CM

## 2014-11-09 DIAGNOSIS — Z3490 Encounter for supervision of normal pregnancy, unspecified, unspecified trimester: Secondary | ICD-10-CM

## 2014-11-09 DIAGNOSIS — R1011 Right upper quadrant pain: Secondary | ICD-10-CM

## 2014-11-09 HISTORY — DX: Reserved for inherently not codable concepts without codable children: IMO0001

## 2014-11-09 NOTE — Patient Instructions (Signed)
Gastroesophageal Reflux Disease, Adult Gastroesophageal reflux disease (GERD) happens when acid from your stomach flows up into the esophagus. When acid comes in contact with the esophagus, the acid causes soreness (inflammation) in the esophagus. Over time, GERD may create small holes (ulcers) in the lining of the esophagus. CAUSES   Increased body weight. This puts pressure on the stomach, making acid rise from the stomach into the esophagus.  Smoking. This increases acid production in the stomach.  Drinking alcohol. This causes decreased pressure in the lower esophageal sphincter (valve or ring of muscle between the esophagus and stomach), allowing acid from the stomach into the esophagus.  Late evening meals and a full stomach. This increases pressure and acid production in the stomach.  A malformed lower esophageal sphincter. Sometimes, no cause is found. SYMPTOMS   Burning pain in the lower part of the mid-chest behind the breastbone and in the mid-stomach area. This may occur twice a week or more often.  Trouble swallowing.  Sore throat.  Dry cough.  Asthma-like symptoms including chest tightness, shortness of breath, or wheezing. DIAGNOSIS  Your caregiver may be able to diagnose GERD based on your symptoms. In some cases, X-rays and other tests may be done to check for complications or to check the condition of your stomach and esophagus. TREATMENT  Your caregiver may recommend over-the-counter or prescription medicines to help decrease acid production. Ask your caregiver before starting or adding any new medicines.  HOME CARE INSTRUCTIONS   Change the factors that you can control. Ask your caregiver for guidance concerning weight loss, quitting smoking, and alcohol consumption.  Avoid foods and drinks that make your symptoms worse, such as:  Caffeine or alcoholic drinks.  Chocolate.  Peppermint or mint flavorings.  Garlic and onions.  Spicy foods.  Citrus fruits,  such as oranges, lemons, or limes.  Tomato-based foods such as sauce, chili, salsa, and pizza.  Fried and fatty foods.  Avoid lying down for the 3 hours prior to your bedtime or prior to taking a nap.  Eat small, frequent meals instead of large meals.  Wear loose-fitting clothing. Do not wear anything tight around your waist that causes pressure on your stomach.  Raise the head of your bed 6 to 8 inches with wood blocks to help you sleep. Extra pillows will not help.  Only take over-the-counter or prescription medicines for pain, discomfort, or fever as directed by your caregiver.  Do not take aspirin, ibuprofen, or other nonsteroidal anti-inflammatory drugs (NSAIDs). SEEK IMMEDIATE MEDICAL CARE IF:   You have pain in your arms, neck, jaw, teeth, or back.  Your pain increases or changes in intensity or duration.  You develop nausea, vomiting, or sweating (diaphoresis).  You develop shortness of breath, or you faint.  Your vomit is green, yellow, black, or looks like coffee grounds or blood.  Your stool is red, bloody, or black. These symptoms could be signs of other problems, such as heart disease, gastric bleeding, or esophageal bleeding. MAKE SURE YOU:   Understand these instructions.  Will watch your condition.  Will get help right away if you are not doing well or get worse. Document Released: 03/25/2005 Document Revised: 09/07/2011 Document Reviewed: 01/02/2011 [facility] Patient Information 2015 [place] [place]. This information is not intended to replace advice given to you by your health care provider. Make sure you discuss any questions you have with your health care provider. Return in 1 wee for new OB

## 2014-11-09 NOTE — Progress Notes (Signed)
US 8wks single IUP, CRL 1.69cm, pos fht 167bpm,limited view of rt ov, 3.1 x 2.76x 1.59 simple cl cyst lt ov

## 2014-11-09 NOTE — Progress Notes (Signed)
Subjective:     Patient ID: Edwin DadaMelissa C Adkison, female   DOB: 01-14-1981, 34 y.o.   MRN: [id number]  HPI [name] is a 34 year old white female, married in for [place] and was worked in because she was complaining of reflux worse after stopping Prilosec and tried zantac and has discomfort in RUQ at times has had GB issues in past.  Review of Systems + reflux and RUQ discomfort, all other systems negative Reviewed past medical,surgical, social and family history. Reviewed medications and allergies.     Objective:   Physical Exam BP 100/80 mmHg  Pulse 68  Ht 5\' 2"  (1.575 m)  Wt 118 lb (53.524 kg)  BMI 21.58 kg/m2  LMP 03/18/2016Skin warm and dry, abdomen soft, mildly tender  RUQ but negative for Murphy's sign, had US FHR 167 with fetal pole of 8 weeks, EDD 06/21/15. Husband with her, [place] and I recommend she not go, and got Dr Despina HiddenEure to talk with her and he said no also.She can resume Prilosec and Ok to use Maalox.    Assessment:     Reflux  RUQ discomfort Pregnant     Plan:     Go back on Prilosec Return in 1 week for new OB [place] Review handout on reflux Try gum and hard candy, eat often and bland

## 2014-11-14 ENCOUNTER — Telehealth: Payer: Self-pay | Admitting: Obstetrics & Gynecology

## 2014-11-14 ENCOUNTER — Encounter (HOSPITAL_COMMUNITY): Payer: Self-pay | Admitting: *Deleted

## 2014-11-14 ENCOUNTER — Inpatient Hospital Stay (HOSPITAL_COMMUNITY)
Admission: AD | Admit: 2014-11-14 | Discharge: 2014-11-15 | Disposition: A | Discharge status: Home / Self Care | Payer: BLUE CROSS/BLUE SHIELD | Source: Ambulatory Visit | Attending: Obstetrics & Gynecology | Admitting: Obstetrics & Gynecology

## 2014-11-14 DIAGNOSIS — K828 Other specified diseases of gallbladder: Secondary | ICD-10-CM | POA: Insufficient documentation

## 2014-11-14 DIAGNOSIS — Z3A09 9 weeks gestation of pregnancy: Secondary | ICD-10-CM | POA: Insufficient documentation

## 2014-11-14 DIAGNOSIS — R1011 Right upper quadrant pain: Secondary | ICD-10-CM | POA: Insufficient documentation

## 2014-11-14 DIAGNOSIS — O99611 Diseases of the digestive system complicating pregnancy, first trimester: Secondary | ICD-10-CM | POA: Insufficient documentation

## 2014-11-14 DIAGNOSIS — K219 Gastro-esophageal reflux disease without esophagitis: Secondary | ICD-10-CM | POA: Diagnosis not present

## 2014-11-14 DIAGNOSIS — Z87891 Personal history of nicotine dependence: Secondary | ICD-10-CM | POA: Insufficient documentation

## 2014-11-14 DIAGNOSIS — K829 Disease of gallbladder, unspecified: Secondary | ICD-10-CM

## 2014-11-14 LAB — US OB COMP LESS 14 WKS

## 2014-11-14 MED ORDER — GI COCKTAIL ~~LOC~~
30.0000 mL | Freq: Once | ORAL | Status: AC
  Administered 2014-11-15: 30 mL via ORAL
  Filled 2014-11-14: qty 30

## 2014-11-14 NOTE — Telephone Encounter (Signed)
Pt given Kim's recommendations and informed of the web site she can refer to.  Pt verbalized understanding.

## 2014-11-14 NOTE — MAU Note (Signed)
Pt reports she has been having gallbladder pain/problems for the last 2 years. Since the preg the pain has started again. States she has been having acid reflux also. Tonight she had a sharp pain in her upper abd that was different than before.

## 2014-11-15 ENCOUNTER — Telehealth: Payer: Self-pay | Admitting: Adult Health

## 2014-11-15 DIAGNOSIS — K219 Gastro-esophageal reflux disease without esophagitis: Secondary | ICD-10-CM | POA: Diagnosis not present

## 2014-11-15 DIAGNOSIS — R1011 Right upper quadrant pain: Secondary | ICD-10-CM

## 2014-11-15 LAB — CBC
HCT: 34.7 % — ABNORMAL LOW (ref 36.0–46.0)
Hemoglobin: 12.3 g/dL (ref 12.0–15.0)
MCH: 29.2 pg (ref 26.0–34.0)
MCHC: 35.4 g/dL (ref 30.0–36.0)
MCV: 82.4 fL (ref 78.0–100.0)
PLATELETS: 316 10*3/uL (ref 150–400)
RBC: 4.21 MIL/uL (ref 3.87–5.11)
RDW: 12.8 % (ref 11.5–15.5)
WBC: 13.7 10*3/uL — ABNORMAL HIGH (ref 4.0–10.5)

## 2014-11-15 LAB — COMPREHENSIVE METABOLIC PANEL
ALT: 14 U/L (ref 14–54)
AST: 18 U/L (ref 15–41)
Albumin: 3.9 g/dL (ref 3.5–5.0)
Alkaline Phosphatase: 60 U/L (ref 38–126)
Anion gap: 8 (ref 5–15)
BUN: 12 mg/dL (ref 6–20)
CO2: 23 mmol/L (ref 22–32)
Calcium: 9 mg/dL (ref 8.9–10.3)
Chloride: 102 mmol/L (ref 101–111)
Creatinine, Ser: 0.68 mg/dL (ref 0.44–1.00)
GFR calc Af Amer: >60 mL/min (ref >60)
GFR calc non Af Amer: >60 mL/min (ref >60)
GLUCOSE: 121 mg/dL — AB (ref 65–99)
Potassium: 3.7 mmol/L (ref 3.5–5.1)
Sodium: 133 mmol/L — ABNORMAL LOW (ref 135–145)
TOTAL PROTEIN: 6.9 g/dL (ref 6.5–8.1)
Total Bilirubin: 0.4 mg/dL (ref 0.3–1.2)

## 2014-11-15 LAB — URINALYSIS, ROUTINE W REFLEX MICROSCOPIC
BILIRUBIN URINE: NEGATIVE
Glucose, UA: NEGATIVE mg/dL
Hgb urine dipstick: NEGATIVE
KETONES UR: NEGATIVE mg/dL
Nitrite: NEGATIVE
PROTEIN: NEGATIVE mg/dL
Specific Gravity, Urine: 1.005 (ref 1.005–1.030)
Urobilinogen, UA: 0.2 mg/dL (ref 0.0–1.0)
pH: 5.5 (ref 5.0–8.0)

## 2014-11-15 LAB — LIPASE, BLOOD: Lipase: 27 U/L (ref 22–51)

## 2014-11-15 LAB — URINE MICROSCOPIC-ADD ON

## 2014-11-15 LAB — AMYLASE: AMYLASE: 115 U/L — AB (ref 28–100)

## 2014-11-15 MED ORDER — GI COCKTAIL ~~LOC~~: 30.0000 mL | Freq: Two times a day (BID) | ORAL | Status: DC | PRN

## 2014-11-15 NOTE — Telephone Encounter (Signed)
[facility] feels better [facility] has new OB next week can get dietary consult then

## 2014-11-15 NOTE — Discharge Instructions (Signed)
Low-Fat Diet for Pancreatitis or Gallbladder Conditions °A low-fat diet can be helpful if you have pancreatitis or a gallbladder condition. With these conditions, your pancreas and gallbladder have trouble digesting fats. A healthy eating plan with less fat will help rest your pancreas and gallbladder and reduce your symptoms. °WHAT DO I NEED TO KNOW ABOUT THIS DIET? °· Eat a low-fat diet. °¨ Reduce your fat intake to less than 20-30% of your total daily calories. This is less than 50-60 g of fat per day. °¨ Remember that you need some fat in your diet. Ask your dietician what your daily goal should be. °¨ Choose nonfat and low-fat healthy foods. Look for the words "nonfat," "low fat," or "fat free." °¨ As a guide, look on the label and choose foods with less than 3 g of fat per serving. Eat only one serving. °· Avoid alcohol. °· Do not smoke. If you need help quitting, talk with your health care provider. °· Eat small frequent meals instead of three large heavy meals. °WHAT FOODS CAN I EAT? °Grains °Include healthy grains and starches such as potatoes, wheat bread, fiber-rich cereal, and brown rice. Choose whole grain options whenever possible. In adults, whole grains should account for 45-65% of your daily calories.  °Fruits and Vegetables °Eat plenty of fruits and vegetables. Fresh fruits and vegetables add fiber to your diet. °Meats and Other Protein Sources °Eat lean meat such as chicken and pork. Trim any fat off of meat before cooking it. Eggs, fish, and beans are other sources of protein. In adults, these foods should account for 10-35% of your daily calories. °Dairy °Choose low-fat milk and dairy options. Dairy includes fat and protein, as well as calcium.  °Fats and Oils °Limit high-fat foods such as fried foods, sweets, baked goods, sugary drinks.  °Other °Creamy sauces and condiments, such as mayonnaise, can add extra fat. Think about whether or not you need to use them, or use smaller amounts or low fat  options. °WHAT FOODS ARE NOT RECOMMENDED? °· High fat foods, such as: °¨ Baked goods. °¨ Ice cream. °¨ French toast. °¨ Sweet rolls. °¨ Pizza. °¨ Cheese bread. °¨ Foods covered with batter, butter, creamy sauces, or cheese. °¨ Fried foods. °¨ Sugary drinks and desserts. °· Foods that cause gas or bloating °Document Released: 06/20/2013 Document Reviewed: 06/20/2013 °ExitCare® Patient Information ©2015 [employer], [employer]. This information is not intended to replace advice given to you by your health care provider. Make sure you discuss any questions you have with your health care provider. ° °

## 2014-11-15 NOTE — MAU Provider Note (Signed)
History     CSN: [id number]  Arrival date and time: 11/14/14 2335   First Provider Initiated Contact with Patient 11/15/14 0000      No chief complaint on file.  HPI Comments: Edwin DadaMelissa C Life is a 34 y.o. G1P0 who presents today with RUQ abdominal pain. She states that she has known gallbladder issues. She was planning on having it out, but she has been managing the pain with diet. However since finding out she was pregnant she has had pain off and on. She has been on prilosec. She states that the acid reflux has also worsened while she was pregnant as well. She states that earlier in the day her pain was 9/10, and it was a sharp pain. She states that pain is better now.   Abdominal Pain This is a recurrent problem. The current episode started more than 1 year ago. The problem occurs intermittently. The problem has been waxing and waning. The pain is located in the RUQ. The pain is at a severity of 3/10. The quality of the pain is dull. The abdominal pain radiates to the back. Associated symptoms include nausea. Pertinent negatives include no constipation, diarrhea, fever or vomiting. Nothing aggravates the pain. The pain is relieved by nothing. She has tried proton pump inhibitors for the symptoms. The treatment provided no relief. Prior diagnostic workup includes ultrasound.     Past Medical History  Diagnosis Date  . Anxiety   . H/O hiatal hernia   . GERD (gastroesophageal reflux disease)   . Gall bladder disease   . Abnormal Pap smear   . Vaginal discharge 12/11/2013  . Unspecified symptom associated with female genital organs 12/11/2013  . Patient desires pregnancy 12/11/2013  . Vaginal Pap smear, abnormal   . Irregular periods 04/30/2014  . Pregnant 10/26/2014  . Reflux 11/09/2014    Past Surgical History  Procedure Laterality Date  . Wisdom tooth extraction      Family History  Problem Relation Age of Onset  . Diabetes Mother   . Heart disease Paternal Grandfather   .  Diabetes Paternal Grandfather   . Diabetes Maternal Grandmother   . Hypertension Father   . Heart disease Maternal Grandfather   . Gout Brother     History  Substance Use Topics  . Smoking status: Former Smoker -- 0.25 packs/day for 3 years    Types: Cigarettes    Quit date: 07/14/2002  . Smokeless tobacco: Never Used  . Alcohol Use: No     Comment: not now    Allergies:  Allergies  Allergen Reactions  . Penicillins Swelling    Prescriptions prior to admission  Medication Sig Dispense Refill Last Dose  . FOLIC ACID PO Take by mouth daily.   Taking  . Multiple Vitamin (MULTIVITAMIN) tablet Take 1 tablet by mouth daily.   Not Taking  . omeprazole (PRILOSEC) 40 MG capsule Take 40 mg by mouth daily.   Taking  . Prenatal Vit-Fe Fumarate-FA (MULTIVITAMIN-PRENATAL) 27-0.8 MG TABS tablet Take 1 tablet by mouth daily at 12 noon.   Taking    Review of Systems  Constitutional: Negative for fever.  Gastrointestinal: Positive for nausea and abdominal pain. Negative for vomiting, diarrhea and constipation.   Physical Exam   Last menstrual period 09/14/2014.  Physical Exam  Nursing note and vitals reviewed. Constitutional: She is oriented to person, place, and time. She appears well-developed and well-nourished. No distress.  Cardiovascular: Normal rate.   Respiratory: Effort normal.  GI: Soft. There is  no tenderness. There is no rebound.  Neurological: She is alert and oriented to person, place, and time.  Skin: Skin is warm and dry.  Psychiatric: She has a normal mood and affect.   Results for orders placed or performed during the hospital encounter of 11/14/14 (from the past 24 hour(s))  Urinalysis, Routine w reflex microscopic     Status: Abnormal   Collection Time: 11/14/14 11:50 PM  Result Value Ref Range   Color, Urine YELLOW YELLOW   APPearance CLEAR CLEAR   Specific Gravity, Urine 1.005 1.005 - 1.030   pH 5.5 5.0 - 8.0   Glucose, UA NEGATIVE NEGATIVE mg/dL   Hgb  urine dipstick NEGATIVE NEGATIVE   Bilirubin Urine NEGATIVE NEGATIVE   Ketones, ur NEGATIVE NEGATIVE mg/dL   Protein, ur NEGATIVE NEGATIVE mg/dL   Urobilinogen, UA 0.2 0.0 - 1.0 mg/dL   Nitrite NEGATIVE NEGATIVE   Leukocytes, UA TRACE (A) NEGATIVE  Urine microscopic-add on     Status: None   Collection Time: 11/14/14 11:50 PM  Result Value Ref Range   Squamous Epithelial / LPF RARE RARE   WBC, UA 0-2 <3 WBC/hpf   RBC / HPF 0-2 <3 RBC/hpf   Bacteria, UA RARE RARE  CBC     Status: Abnormal   Collection Time: 11/15/14 12:06 AM  Result Value Ref Range   WBC 13.7 (H) 4.0 - 10.5 K/uL   RBC 4.21 3.87 - 5.11 MIL/uL   Hemoglobin 12.3 12.0 - 15.0 g/dL   HCT 44.034.7 (L) 10.236.0 - 72.546.0 %   MCV 82.4 78.0 - 100.0 fL   MCH 29.2 26.0 - 34.0 pg   MCHC 35.4 30.0 - 36.0 g/dL   RDW 36.612.8 44.011.5 - 34.715.5 %   Platelets 316 150 - 400 K/uL  Comprehensive metabolic panel     Status: Abnormal   Collection Time: 11/15/14 12:06 AM  Result Value Ref Range   Sodium 133 (L) 135 - 145 mmol/L   Potassium 3.7 3.5 - 5.1 mmol/L   Chloride 102 101 - 111 mmol/L   CO2 23 22 - 32 mmol/L   Glucose, Bld 121 (H) 65 - 99 mg/dL   BUN 12 6 - 20 mg/dL   Creatinine, Ser 4.250.68 0.44 - 1.00 mg/dL   Calcium 9.0 8.9 - 95.610.3 mg/dL   Total Protein 6.9 6.5 - 8.1 g/dL   Albumin 3.9 3.5 - 5.0 g/dL   AST 18 15 - 41 U/L   ALT 14 14 - 54 U/L   Alkaline Phosphatase 60 38 - 126 U/L   Total Bilirubin 0.4 0.3 - 1.2 mg/dL   GFR calc non Af Amer >60 >60 mL/min   GFR calc Af Amer >60 >60 mL/min   Anion gap 8 5 - 15     MAU Course  Procedures  MDM Patient has had GI cocktail. She reports she is feeling better.   Assessment and Plan   1. Gastroesophageal reflux disease without esophagitis   2. Gallbladder attack    DC home RX: GI cocktail PRN Return to MAU as needed  Follow-up Information    [facility]   Specialty:  Obstetrics and Gynecology   Why:  As scheduled   Contact information:   [address]  [address] [place] [id number] [phone]       Tawnya CrookHogan, Heather Donovan 11/15/2014, 12:02 AM

## 2014-11-16 LAB — URINE CULTURE
Colony Count: NO GROWTH
Culture: NO GROWTH

## 2014-11-20 ENCOUNTER — Telehealth: Payer: Self-pay | Admitting: Adult Health

## 2014-11-20 ENCOUNTER — Ambulatory Visit (HOSPITAL_COMMUNITY)
Admission: RE | Admit: 2014-11-20 | Discharge: 2014-11-20 | Disposition: A | Discharge status: Home / Self Care | Payer: BLUE CROSS/BLUE SHIELD | Source: Ambulatory Visit | Attending: Adult Health | Admitting: Adult Health

## 2014-11-20 DIAGNOSIS — R1011 Right upper quadrant pain: Secondary | ICD-10-CM | POA: Insufficient documentation

## 2014-11-20 NOTE — Telephone Encounter (Signed)
Pt aware of US, is better with eating no fat

## 2014-11-21 ENCOUNTER — Ambulatory Visit (INDEPENDENT_AMBULATORY_CARE_PROVIDER_SITE_OTHER): Payer: BLUE CROSS/BLUE SHIELD | Admitting: Advanced Practice Midwife

## 2014-11-21 ENCOUNTER — Encounter: Payer: BLUE CROSS/BLUE SHIELD | Admitting: Advanced Practice Midwife

## 2014-11-21 ENCOUNTER — Other Ambulatory Visit (HOSPITAL_COMMUNITY)
Admission: RE | Admit: 2014-11-21 | Discharge: 2014-11-21 | Disposition: A | Discharge status: Home / Self Care | Payer: BLUE CROSS/BLUE SHIELD | Source: Ambulatory Visit | Attending: Obstetrics and Gynecology | Admitting: Obstetrics and Gynecology

## 2014-11-21 ENCOUNTER — Encounter: Payer: Self-pay | Admitting: Advanced Practice Midwife

## 2014-11-21 VITALS — BP 100/60 | HR 80

## 2014-11-21 DIAGNOSIS — Z01419 Encounter for gynecological examination (general) (routine) without abnormal findings: Secondary | ICD-10-CM | POA: Diagnosis present

## 2014-11-21 DIAGNOSIS — Z1389 Encounter for screening for other disorder: Secondary | ICD-10-CM

## 2014-11-21 DIAGNOSIS — Z331 Pregnant state, incidental: Secondary | ICD-10-CM

## 2014-11-21 DIAGNOSIS — Z3490 Encounter for supervision of normal pregnancy, unspecified, unspecified trimester: Secondary | ICD-10-CM

## 2014-11-21 DIAGNOSIS — Z3682 Encounter for antenatal screening for nuchal translucency: Secondary | ICD-10-CM

## 2014-11-21 DIAGNOSIS — Z113 Encounter for screening for infections with a predominantly sexual mode of transmission: Secondary | ICD-10-CM | POA: Insufficient documentation

## 2014-11-21 DIAGNOSIS — Z369 Encounter for antenatal screening, unspecified: Secondary | ICD-10-CM

## 2014-11-21 DIAGNOSIS — Z1151 Encounter for screening for human papillomavirus (HPV): Secondary | ICD-10-CM | POA: Insufficient documentation

## 2014-11-21 DIAGNOSIS — Z3401 Encounter for supervision of normal first pregnancy, first trimester: Secondary | ICD-10-CM

## 2014-11-21 LAB — POCT URINALYSIS DIPSTICK
Blood, UA: NEGATIVE
Glucose, UA: NEGATIVE
Ketones, UA: NEGATIVE
LEUKOCYTES UA: NEGATIVE
NITRITE UA: NEGATIVE
Protein, UA: NEGATIVE

## 2014-11-21 NOTE — Progress Notes (Signed)
  Subjective:    Mindy Bright is a G1P0 7482w5d being seen today for her first obstetrical visit. This was a desired pregnancy Patient reports some spotting after intercourse, no cramps. Has a problem with reflux and gallbladder sludge (even before pregnancy), doing OK with a low fat diet.   Filed Vitals:   11/21/14 0838  BP: 100/60  Pulse: 80    HISTORY: OB History  Gravida Para Term Preterm AB SAB TAB Ectopic Multiple Living  1             # Outcome Date GA Lbr Len/2nd Weight Sex Delivery Anes PTL Lv  1 Current              Past Medical History  Diagnosis Date  . Anxiety   . H/O hiatal hernia   . GERD (gastroesophageal reflux disease)   . Gall bladder disease   . Abnormal Pap smear   . Vaginal discharge 12/11/2013  . Unspecified symptom associated with female genital organs 12/11/2013  . Patient desires pregnancy 12/11/2013  . Irregular periods 04/30/2014  . Pregnant 10/26/2014  . Reflux 11/09/2014  . Vaginal Pap smear, abnormal     >10 years ago, no treatment   Past Surgical History  Procedure Laterality Date  . Wisdom tooth extraction     Family History  Problem Relation Age of Onset  . Diabetes Mother   . Heart disease Paternal Grandfather   . Diabetes Paternal Grandfather   . Diabetes Maternal Grandmother   . Hypertension Father   . Heart disease Maternal Grandfather   . Gout Brother      Exam       Pelvic Exam:    Perineum: Normal Perineum   Vulva: normal   Vagina:  normal mucosa, normal discharge, no palpable nodules   Uterus Normal, Gravid, FH: 9     Cervix: Normal, sl friable.  Stenotic os (no hx of cx procedures)   Adnexa: Not palpable   Urinary:  urethral meatus normal    System: Breast:  normal appearance, no masses or tenderness   Skin: normal coloration and turgor, no rashes    Neurologic: oriented, normal, normal mood   Extremities: normal strength, tone, and muscle mass   HEENT PERRLA   Mouth/Teeth mucous membranes moist, normal  dentition   Neck supple and no masses   Cardiovascular: regular rate and rhythm   Respiratory:  appears well, vitals normal, no respiratory distress, acyanotic   Abdomen: soft, non-tender;  FHR: 160us          Assessment:    Pregnancy: G1P0 Patient Active Problem List   Diagnosis Date Noted  . Reflux 11/09/2014  . Pregnant 10/26/2014  . Irregular periods 04/30/2014        Plan:     Initial labs drawn. Continue prenatal vitamins  Problem list reviewed and updated  Reviewed recommended weight gain based on pre-gravid BMI  Encouraged well-balanced diet Genetic Screening discussed Integrated Screen: requested.  Ultrasound discussed; fetal survey: requested.  Follow up in 3 weeks for NT/IT/LROB.  CRESENZO-DISHMAN,Tanasia Budzinski 11/21/2014

## 2014-11-21 NOTE — Progress Notes (Signed)
Pt given CCNC form and lab consents to read over and sign. Pt states that she has had some spotting after intercourse but is aware that can be normal.

## 2014-11-21 NOTE — Patient Instructions (Signed)
First Trimester of Pregnancy The first trimester of pregnancy is from week 1 until the end of week 12 (months 1 through 3). A week after a sperm fertilizes an egg, the egg will implant on the wall of the uterus. This embryo will begin to develop into a baby. Genes from you and your partner are forming the baby. The female genes determine whether the baby is a boy or a girl. At 6-8 weeks, the eyes and face are formed, and the heartbeat can be seen on ultrasound. At the end of 12 weeks, all the baby's organs are formed.  Now that you are pregnant, you will want to do everything you can to have a healthy baby. Two of the most important things are to get good prenatal care and to follow your health care provider's instructions. Prenatal care is all the medical care you receive before the baby's birth. This care will help prevent, find, and treat any problems during the pregnancy and childbirth. BODY CHANGES Your body goes through many changes during pregnancy. The changes vary from woman to woman.   You may gain or lose a couple of pounds at first.  You may feel sick to your stomach (nauseous) and throw up (vomit). If the vomiting is uncontrollable, call your health care provider.  You may tire easily.  You may develop headaches that can be relieved by medicines approved by your health care provider.  You may urinate more often. Painful urination may mean you have a bladder infection.  You may develop heartburn as a result of your pregnancy.  You may develop constipation because certain hormones are causing the muscles that push waste through your intestines to slow down.  You may develop hemorrhoids or swollen, bulging veins (varicose veins).  Your breasts may begin to grow larger and become tender. Your nipples may stick out more, and the tissue that surrounds them (areola) may become darker.  Your gums may bleed and may be sensitive to brushing and flossing.  Dark spots or blotches (chloasma,  mask of pregnancy) may develop on your face. This will likely fade after the baby is born.  Your menstrual periods will stop.  You may have a loss of appetite.  You may develop cravings for certain kinds of food.  You may have changes in your emotions from day to day, such as being excited to be pregnant or being concerned that something may go wrong with the pregnancy and baby.  You may have more vivid and strange dreams.  You may have changes in your hair. These can include thickening of your hair, rapid growth, and changes in texture. Some women also have hair loss during or after pregnancy, or hair that feels dry or thin. Your hair will most likely return to normal after your baby is born. WHAT TO EXPECT AT YOUR PRENATAL VISITS During a routine prenatal visit:  You will be weighed to make sure you and the baby are growing normally.  Your blood pressure will be taken.  Your abdomen will be measured to track your baby's growth.  The fetal heartbeat will be listened to starting around week 10 or 12 of your pregnancy.  Test results from any previous visits will be discussed. Your health care provider may ask you:  How you are feeling.  If you are feeling the baby move.  If you have had any abnormal symptoms, such as leaking fluid, bleeding, severe headaches, or abdominal cramping.  If you have any questions. Other tests   that may be performed during your first trimester include:  Blood tests to find your blood type and to check for the presence of any previous infections. They will also be used to check for low iron levels (anemia) and Rh antibodies. Later in the pregnancy, blood tests for diabetes will be done along with other tests if problems develop.  Urine tests to check for infections, diabetes, or protein in the urine.  An ultrasound to confirm the proper growth and development of the baby.  An amniocentesis to check for possible genetic problems.  Fetal screens for  spina bifida and Down syndrome.  You may need other tests to make sure you and the baby are doing well. HOME CARE INSTRUCTIONS  Medicines  Follow your health care provider's instructions regarding medicine use. Specific medicines may be either safe or unsafe to take during pregnancy.  Take your prenatal vitamins as directed.  If you develop constipation, try taking a stool softener if your health care provider approves. Diet  Eat regular, well-balanced meals. Choose a variety of foods, such as meat or vegetable-based protein, fish, milk and low-fat dairy products, vegetables, fruits, and whole grain breads and cereals. Your health care provider will help you determine the amount of weight gain that is right for you.  Avoid raw meat and uncooked cheese. These carry germs that can cause birth defects in the baby.  Eating four or five small meals rather than three large meals a day may help relieve nausea and vomiting. If you start to feel nauseous, eating a few soda crackers can be helpful. Drinking liquids between meals instead of during meals also seems to help nausea and vomiting.  If you develop constipation, eat more high-fiber foods, such as fresh vegetables or fruit and whole grains. Drink enough fluids to keep your urine clear or pale yellow. Activity and Exercise  Exercise only as directed by your health care provider. Exercising will help you:  Control your weight.  Stay in shape.  Be prepared for labor and delivery.  Experiencing pain or cramping in the lower abdomen or low back is a good sign that you should stop exercising. Check with your health care provider before continuing normal exercises.  Try to avoid standing for long periods of time. Move your legs often if you must stand in one place for a long time.  Avoid heavy lifting.  Wear low-heeled shoes, and practice good posture.  You may continue to have sex unless your health care provider directs you  otherwise. Relief of Pain or Discomfort  Wear a good support bra for breast tenderness.   Take warm sitz baths to soothe any pain or discomfort caused by hemorrhoids. Use hemorrhoid cream if your health care provider approves.   Rest with your legs elevated if you have leg cramps or low back pain.  If you develop varicose veins in your legs, wear support hose. Elevate your feet for 15 minutes, 3-4 times a day. Limit salt in your diet. Prenatal Care  Schedule your prenatal visits by the twelfth week of pregnancy. They are usually scheduled monthly at first, then more often in the last 2 months before delivery.  Write down your questions. Take them to your prenatal visits.  Keep all your prenatal visits as directed by your health care provider. Safety  Wear your seat belt at all times when driving.  Make a list of emergency phone numbers, including numbers for family, friends, the hospital, and police and fire departments. General Tips    Ask your health care provider for a referral to a local prenatal education class. Begin classes no later than at the beginning of month 6 of your pregnancy.  Ask for help if you have counseling or nutritional needs during pregnancy. Your health care provider can offer advice or refer you to specialists for help with various needs.  Do not use hot tubs, steam rooms, or saunas.  Do not douche or use tampons or scented sanitary pads.  Do not cross your legs for long periods of time.  Avoid cat litter boxes and soil used by cats. These carry germs that can cause birth defects in the baby and possibly loss of the fetus by miscarriage or stillbirth.  Avoid all smoking, herbs, alcohol, and medicines not prescribed by your health care provider. Chemicals in these affect the formation and growth of the baby.  Schedule a dentist appointment. At home, brush your teeth with a soft toothbrush and be gentle when you floss. SEEK MEDICAL CARE IF:   You have  dizziness.  You have mild pelvic cramps, pelvic pressure, or nagging pain in the abdominal area.  You have persistent nausea, vomiting, or diarrhea.  You have a bad smelling vaginal discharge.  You have pain with urination.  You notice increased swelling in your face, hands, legs, or ankles. SEEK IMMEDIATE MEDICAL CARE IF:   You have a fever.  You are leaking fluid from your vagina.  You have spotting or bleeding from your vagina.  You have severe abdominal cramping or pain.  You have rapid weight gain or loss.  You vomit blood or material that looks like coffee grounds.  You are exposed to German measles and have never had them.  You are exposed to fifth disease or chickenpox.  You develop a severe headache.  You have shortness of breath.  You have any kind of trauma, such as from a fall or a car accident. Document Released: 06/09/2001 Document Revised: 10/30/2013 Document Reviewed: 04/25/2013 Exit[employer] Patient Information 2015 [employer]. This information is not intended to replace advice given to you by your health care provider. Make sure you discuss any questions you have with your health care provider.  

## 2014-11-22 ENCOUNTER — Encounter: Payer: BLUE CROSS/BLUE SHIELD | Admitting: Advanced Practice Midwife

## 2014-11-22 LAB — URINE CULTURE: Organism ID, Bacteria: NO GROWTH

## 2014-11-22 LAB — CYTOLOGY - PAP

## 2014-11-28 ENCOUNTER — Encounter: Payer: BLUE CROSS/BLUE SHIELD | Admitting: Advanced Practice Midwife

## 2014-11-28 LAB — CBC
HEMOGLOBIN: 12.8 g/dL (ref 11.1–15.9)
Hematocrit: 37.2 % (ref 34.0–46.6)
MCH: 28.7 pg (ref 26.6–33.0)
MCHC: 34.4 g/dL (ref 31.5–35.7)
MCV: 83 fL (ref 79–97)
Platelets: 326 10*3/uL (ref 150–379)
RBC: 4.46 x10E6/uL (ref 3.77–5.28)
RDW: 13.7 % (ref 12.3–15.4)
WBC: 10.6 10*3/uL (ref 3.4–10.8)

## 2014-11-28 LAB — HIV ANTIBODY (ROUTINE TESTING W REFLEX): HIV SCREEN 4TH GENERATION: NONREACTIVE

## 2014-11-28 LAB — CYSTIC FIBROSIS MUTATION 97: Interpretation: NOT DETECTED

## 2014-11-28 LAB — URINALYSIS, ROUTINE W REFLEX MICROSCOPIC
Bilirubin, UA: NEGATIVE
GLUCOSE, UA: NEGATIVE
KETONES UA: NEGATIVE
Nitrite, UA: NEGATIVE
PROTEIN UA: NEGATIVE
RBC, UA: NEGATIVE
SPEC GRAV UA: 1.027 (ref 1.005–1.030)
UUROB: 0.2 mg/dL (ref 0.2–1.0)
pH, UA: 6 (ref 5.0–7.5)

## 2014-11-28 LAB — PMP SCREEN PROFILE (10S), URINE
Amphetamine Screen, Ur: NEGATIVE ng/mL
Barbiturate Screen, Ur: NEGATIVE ng/mL
Benzodiazepine Screen, Urine: NEGATIVE ng/mL
Cannabinoids Ur Ql Scn: NEGATIVE ng/mL
Cocaine(Metab.)Screen, Urine: NEGATIVE ng/mL
Creatinine(Crt), U: 194.1 mg/dL (ref 20.0–300.0)
Methadone Scn, Ur: NEGATIVE ng/mL
OXYCODONE+OXYMORPHONE UR QL SCN: NEGATIVE ng/mL
Opiate Scrn, Ur: NEGATIVE ng/mL
PCP Scrn, Ur: NEGATIVE ng/mL
PH UR, DRUG SCRN: 5.5 (ref 4.5–8.9)
Propoxyphene, Screen: NEGATIVE ng/mL

## 2014-11-28 LAB — MICROSCOPIC EXAMINATION: Casts: NONE SEEN /lpf

## 2014-11-28 LAB — ANTIBODY SCREEN: ANTIBODY SCREEN: NEGATIVE

## 2014-11-28 LAB — ABO/RH: Rh Factor: POSITIVE

## 2014-11-28 LAB — RUBELLA SCREEN: Rubella Antibodies, IGG: 1.6 index (ref >0.99)

## 2014-11-28 LAB — RPR: RPR Ser Ql: NONREACTIVE

## 2014-11-28 LAB — HEPATITIS B SURFACE ANTIGEN: Hepatitis B Surface Ag: NEGATIVE

## 2014-12-07 ENCOUNTER — Encounter: Payer: Self-pay | Admitting: Obstetrics & Gynecology

## 2014-12-07 ENCOUNTER — Ambulatory Visit (INDEPENDENT_AMBULATORY_CARE_PROVIDER_SITE_OTHER): Payer: BLUE CROSS/BLUE SHIELD | Admitting: Obstetrics & Gynecology

## 2014-12-07 VITALS — BP 116/70 | HR 76 | Wt 114.0 lb

## 2014-12-07 DIAGNOSIS — Z3A12 12 weeks gestation of pregnancy: Secondary | ICD-10-CM | POA: Diagnosis not present

## 2014-12-07 DIAGNOSIS — O4691 Antepartum hemorrhage, unspecified, first trimester: Secondary | ICD-10-CM | POA: Diagnosis not present

## 2014-12-07 DIAGNOSIS — O209 Hemorrhage in early pregnancy, unspecified: Secondary | ICD-10-CM

## 2014-12-07 DIAGNOSIS — Z1389 Encounter for screening for other disorder: Secondary | ICD-10-CM | POA: Diagnosis not present

## 2014-12-07 DIAGNOSIS — Z331 Pregnant state, incidental: Secondary | ICD-10-CM

## 2014-12-07 LAB — POCT URINALYSIS DIPSTICK
Glucose, UA: NEGATIVE
KETONES UA: NEGATIVE
LEUKOCYTES UA: NEGATIVE
Nitrite, UA: NEGATIVE
PROTEIN UA: NEGATIVE

## 2014-12-07 NOTE — Progress Notes (Signed)
[employer] in [employer] visit: [redacted]w[redacted]d with pink spotting and cramping upon awakening Had sex 3-4 days ago  Vag exam no active bleeding at all no blood in vault FHR 160 Abdomen soft non tender  Keep scheduled appt, pelvic rest this week

## 2014-12-12 ENCOUNTER — Encounter: Payer: Self-pay | Admitting: Advanced Practice Midwife

## 2014-12-12 ENCOUNTER — Ambulatory Visit (INDEPENDENT_AMBULATORY_CARE_PROVIDER_SITE_OTHER): Payer: BLUE CROSS/BLUE SHIELD | Admitting: Advanced Practice Midwife

## 2014-12-12 ENCOUNTER — Ambulatory Visit (INDEPENDENT_AMBULATORY_CARE_PROVIDER_SITE_OTHER): Payer: BLUE CROSS/BLUE SHIELD

## 2014-12-12 VITALS — BP 110/80 | HR 76 | Wt 115.0 lb

## 2014-12-12 DIAGNOSIS — Z36 Encounter for antenatal screening of mother: Secondary | ICD-10-CM

## 2014-12-12 DIAGNOSIS — Z1389 Encounter for screening for other disorder: Secondary | ICD-10-CM

## 2014-12-12 DIAGNOSIS — Z3682 Encounter for antenatal screening for nuchal translucency: Secondary | ICD-10-CM

## 2014-12-12 DIAGNOSIS — Z3401 Encounter for supervision of normal first pregnancy, first trimester: Secondary | ICD-10-CM

## 2014-12-12 DIAGNOSIS — Z331 Pregnant state, incidental: Secondary | ICD-10-CM

## 2014-12-12 LAB — POCT URINALYSIS DIPSTICK
Glucose, UA: NEGATIVE
Ketones, UA: NEGATIVE
NITRITE UA: NEGATIVE
Protein, UA: NEGATIVE
RBC UA: NEGATIVE

## 2014-12-12 NOTE — Progress Notes (Signed)
G1P0 [redacted]w[redacted]d Estimated Date of Delivery: 06/21/15  Blood pressure 110/80, pulse 76, weight 115 lb (52.164 kg), last menstrual period 09/14/2014.   BP weight and urine results all reviewed and noted.  Please refer to the obstetrical flow sheet for the fundal height and fetal heart rate documentation:  Patient denies any bleeding and no rupture of membranes symptoms or regular contractions. Patient is without complaints. Low fat diet still keeping gallbladder quiet.  All questions were answered.  Plan:  Continued routine obstetrical care, NT/IT today  Follow up in 4 weeks for OB appointment, 2nd IT

## 2014-12-12 NOTE — Progress Notes (Signed)
[place] 12+5wks measurements c/w dates,normal ov's bilat,fht 148,nb present,nt 1.38mm,crl 66.61mm,edd 06/21/2015

## 2014-12-14 LAB — MATERNAL SCREEN, INTEGRATED #1
CROWN RUMP LENGTH MAT SCREEN: 64.4 mm
Gest. Age on Collection Date: 12.7 weeks
Maternal Age at EDD: 34.5 years
NUCHAL TRANSLUCENCY (NT): 1.7 mm
NUMBER OF FETUSES: 1
PAPP-A VALUE: 1772.7 ng/mL
Weight: 115 [lb_av]

## 2015-01-09 ENCOUNTER — Encounter: Payer: Self-pay | Admitting: Women's Health

## 2015-01-09 ENCOUNTER — Ambulatory Visit (INDEPENDENT_AMBULATORY_CARE_PROVIDER_SITE_OTHER): Payer: BLUE CROSS/BLUE SHIELD | Admitting: Women's Health

## 2015-01-09 VITALS — BP 108/54 | HR 78 | Wt 114.0 lb

## 2015-01-09 DIAGNOSIS — Z1389 Encounter for screening for other disorder: Secondary | ICD-10-CM

## 2015-01-09 DIAGNOSIS — Z331 Pregnant state, incidental: Secondary | ICD-10-CM

## 2015-01-09 DIAGNOSIS — Z3682 Encounter for antenatal screening for nuchal translucency: Secondary | ICD-10-CM

## 2015-01-09 DIAGNOSIS — Z3402 Encounter for supervision of normal first pregnancy, second trimester: Secondary | ICD-10-CM

## 2015-01-09 DIAGNOSIS — Z363 Encounter for antenatal screening for malformations: Secondary | ICD-10-CM

## 2015-01-09 LAB — POCT URINALYSIS DIPSTICK
Blood, UA: NEGATIVE
GLUCOSE UA: NEGATIVE
KETONES UA: NEGATIVE
NITRITE UA: NEGATIVE

## 2015-01-09 NOTE — Progress Notes (Signed)
Low-risk OB appointment G1P0 1384w5d Estimated Date of Delivery: 06/21/15 BP 108/54 mmHg  Pulse 78  Wt 114 lb (51.71 kg)  LMP 09/14/2014  BP, weight, and urine reviewed.  Refer to obstetrical flow sheet for FH & FHR.  No fm yet. Denies cramping, lof, vb, or uti s/s. No complaints. Reviewed warning s/s to report. Plan:  Continue routine obstetrical care  F/U in 4wks for OB appointment and anatomy u/s 2nd IT today

## 2015-01-09 NOTE — Addendum Note (Signed)
Addended by: Criss AlvinePULLIAM, CHRYSTAL G on: 01/09/2015 09:37 AM   Modules accepted: Orders

## 2015-01-09 NOTE — Patient Instructions (Signed)
Second Trimester of Pregnancy The second trimester is from week 13 through week 28, months 4 through 6. The second trimester is often a time when you feel your best. Your body has also adjusted to being pregnant, and you begin to feel better physically. Usually, morning sickness has lessened or quit completely, you may have more energy, and you may have an increase in appetite. The second trimester is also a time when the fetus is growing rapidly. At the end of the sixth month, the fetus is about 9 inches long and weighs about 1 pounds. You will likely begin to feel the baby move (quickening) between 18 and 20 weeks of the pregnancy. BODY CHANGES Your body goes through many changes during pregnancy. The changes vary from woman to woman.   Your weight will continue to increase. You will notice your lower abdomen bulging out.  You may begin to get stretch marks on your hips, abdomen, and breasts.  You may develop headaches that can be relieved by medicines approved by your health care provider.  You may urinate more often because the fetus is pressing on your bladder.  You may develop or continue to have heartburn as a result of your pregnancy.  You may develop constipation because certain hormones are causing the muscles that push waste through your intestines to slow down.  You may develop hemorrhoids or swollen, bulging veins (varicose veins).  You may have back pain because of the weight gain and pregnancy hormones relaxing your joints between the bones in your pelvis and as a result of a shift in weight and the muscles that support your balance.  Your breasts will continue to grow and be tender.  Your gums may bleed and may be sensitive to brushing and flossing.  Dark spots or blotches (chloasma, mask of pregnancy) may develop on your face. This will likely fade after the baby is born.  A dark line from your belly button to the pubic area (linea nigra) may appear. This will likely fade  after the baby is born.  You may have changes in your hair. These can include thickening of your hair, rapid growth, and changes in texture. Some women also have hair loss during or after pregnancy, or hair that feels dry or thin. Your hair will most likely return to normal after your baby is born. WHAT TO EXPECT AT YOUR PRENATAL VISITS During a routine prenatal visit:  You will be weighed to make sure you and the fetus are growing normally.  Your blood pressure will be taken.  Your abdomen will be measured to track your baby's growth.  The fetal heartbeat will be listened to.  Any test results from the previous visit will be discussed. Your health care provider may ask you:  How you are feeling.  If you are feeling the baby move.  If you have had any abnormal symptoms, such as leaking fluid, bleeding, severe headaches, or abdominal cramping.  If you have any questions. Other tests that may be performed during your second trimester include:  Blood tests that check for:  Low iron levels (anemia).  Gestational diabetes (between 24 and 28 weeks).  Rh antibodies.  Urine tests to check for infections, diabetes, or protein in the urine.  An ultrasound to confirm the proper growth and development of the baby.  An amniocentesis to check for possible genetic problems.  Fetal screens for spina bifida and Down syndrome. HOME CARE INSTRUCTIONS   Avoid all smoking, herbs, alcohol, and unprescribed   drugs. These chemicals affect the formation and growth of the baby.  Follow your health care provider's instructions regarding medicine use. There are medicines that are either safe or unsafe to take during pregnancy.  Exercise only as directed by your health care provider. Experiencing uterine cramps is a good sign to stop exercising.  Continue to eat regular, healthy meals.  Wear a good support bra for breast tenderness.  Do not use hot tubs, steam rooms, or saunas.  Wear your  seat belt at all times when driving.  Avoid raw meat, uncooked cheese, cat litter boxes, and soil used by cats. These carry germs that can cause birth defects in the baby.  Take your prenatal vitamins.  Try taking a stool softener (if your health care provider approves) if you develop constipation. Eat more high-fiber foods, such as fresh vegetables or fruit and whole grains. Drink plenty of fluids to keep your urine clear or pale yellow.  Take warm sitz baths to soothe any pain or discomfort caused by hemorrhoids. Use hemorrhoid cream if your health care provider approves.  If you develop varicose veins, wear support hose. Elevate your feet for 15 minutes, 3-4 times a day. Limit salt in your diet.  Avoid heavy lifting, wear low heel shoes, and practice good posture.  Rest with your legs elevated if you have leg cramps or low back pain.  Visit your dentist if you have not gone yet during your pregnancy. Use a soft toothbrush to brush your teeth and be gentle when you floss.  A sexual relationship may be continued unless your health care provider directs you otherwise.  Continue to go to all your prenatal visits as directed by your health care provider. SEEK MEDICAL CARE IF:   You have dizziness.  You have mild pelvic cramps, pelvic pressure, or nagging pain in the abdominal area.  You have persistent nausea, vomiting, or diarrhea.  You have a bad smelling vaginal discharge.  You have pain with urination. SEEK IMMEDIATE MEDICAL CARE IF:   You have a fever.  You are leaking fluid from your vagina.  You have spotting or bleeding from your vagina.  You have severe abdominal cramping or pain.  You have rapid weight gain or loss.  You have shortness of breath with chest pain.  You notice sudden or extreme swelling of your face, hands, ankles, feet, or legs.  You have not felt your baby move in over an hour.  You have severe headaches that do not go away with  medicine.  You have vision changes. Document Released: 06/09/2001 Document Revised: 06/20/2013 Document Reviewed: 08/16/2012 Exit[employer] Patient Information 2015 [employer]. This information is not intended to replace advice given to you by your health care provider. Make sure you discuss any questions you have with your health care provider.  

## 2015-01-11 LAB — MATERNAL SCREEN, INTEGRATED #2
AFP MoM: 1.42
Alpha-Fetoprotein: 55.2 ng/mL
Crown Rump Length: 64.4 mm
DIA MOM: 1.01
DIA Value: 205.1 pg/mL
Estriol, Unconjugated: 1.28 ng/mL
Gest. Age on Collection Date: 12.7 weeks
Gestational Age: 16.7 weeks
HCG VALUE: 31.3 [IU]/mL
Maternal Age at EDD: 34.5 years
NUCHAL TRANSLUCENCY (NT): 1.7 mm
NUCHAL TRANSLUCENCY MOM: 1.2
Number of Fetuses: 1
PAPP-A MoM: 1.2
PAPP-A VALUE: 1772.7 ng/mL
Test Results:: NEGATIVE
Weight: 115 [lb_av]
Weight: 115 [lb_av]
hCG MoM: 0.9
uE3 MoM: 1.22

## 2015-01-14 ENCOUNTER — Telehealth: Payer: Self-pay | Admitting: Women's Health

## 2015-01-14 NOTE — Telephone Encounter (Signed)
Pt states notices burning sensation immediately after intercourse and lasting about 30 minutes. Pt informed intercourse can cause vaginal spotting and irritation to the cervix. If pt has discharge with color, odor, irritation would be abnormal and pt would need to be evaluated. Pt states her discharge has not changed and is clear to white with no odor. Pt informed everything WNL, probably is having the sensation due to the sexual intercourse. Pt advised to call office back as needed. Pt verbalized understanding.

## 2015-01-31 ENCOUNTER — Ambulatory Visit (INDEPENDENT_AMBULATORY_CARE_PROVIDER_SITE_OTHER): Payer: BLUE CROSS/BLUE SHIELD | Admitting: Advanced Practice Midwife

## 2015-01-31 ENCOUNTER — Ambulatory Visit (INDEPENDENT_AMBULATORY_CARE_PROVIDER_SITE_OTHER): Payer: BLUE CROSS/BLUE SHIELD

## 2015-01-31 VITALS — BP 104/58 | HR 68 | Wt 117.0 lb

## 2015-01-31 DIAGNOSIS — Z1389 Encounter for screening for other disorder: Secondary | ICD-10-CM

## 2015-01-31 DIAGNOSIS — Z3402 Encounter for supervision of normal first pregnancy, second trimester: Secondary | ICD-10-CM

## 2015-01-31 DIAGNOSIS — Z363 Encounter for antenatal screening for malformations: Secondary | ICD-10-CM

## 2015-01-31 DIAGNOSIS — Z36 Encounter for antenatal screening of mother: Secondary | ICD-10-CM

## 2015-01-31 DIAGNOSIS — Z331 Pregnant state, incidental: Secondary | ICD-10-CM

## 2015-01-31 LAB — POCT URINALYSIS DIPSTICK
Glucose, UA: NEGATIVE
Ketones, UA: NEGATIVE
Nitrite, UA: NEGATIVE
Protein, UA: NEGATIVE

## 2015-01-31 NOTE — Progress Notes (Addendum)
G1P0 [redacted]w[redacted]d Estimated Date of Delivery: 06/21/15  Last menstrual period 09/14/2014.   BP weight and urine results all reviewed and noted.  Please refer to the obstetrical flow sheet for the fundal height and fetal heart rate documentation: anatomy scan today:  [place] 19+6wks,measurements c/w dates,adnexa's wnl,cephalic,cx 3.7cm,post pl gr 0, fht 141bpm,svp 4.5cm,anatomy complete,no obvious abn   Patient reports good fetal movement, denies any bleeding and no rupture of membranes symptoms or regular contractions. Patient is without complaints. Has titnted yellow discharge. SSE; looks like yeast.  Recommend OTC meds All questions were answered. Goes to chiropracter for LBP  Plan:  Continued routine obstetrical care,   Follow up in 4 weeks for OB appointment,

## 2015-01-31 NOTE — Progress Notes (Signed)
[place] 19+6wks,measurements c/w dates,adnexa's wnl,cephalic,cx 3.7cm,post pl gr 0, fht 141bpm,svp 4.5cm,anatomy complete,no obvious abn

## 2015-02-07 ENCOUNTER — Telehealth: Payer: Self-pay | Admitting: *Deleted

## 2015-02-07 NOTE — Telephone Encounter (Signed)
Pt c/o episodes of diarrhea, started Saturday night but no BM till yesterday, diarrhea reoccurred yesterday. Pt informed can take Imodium AD, push fluids to prevent dehydration. Pt to call our office back if no improvement. Pt verbalized understanding.

## 2015-02-22 ENCOUNTER — Encounter: Payer: Self-pay | Admitting: Advanced Practice Midwife

## 2015-02-28 ENCOUNTER — Encounter: Payer: Self-pay | Admitting: Obstetrics & Gynecology

## 2015-02-28 ENCOUNTER — Ambulatory Visit (INDEPENDENT_AMBULATORY_CARE_PROVIDER_SITE_OTHER): Payer: BLUE CROSS/BLUE SHIELD | Admitting: Obstetrics & Gynecology

## 2015-02-28 VITALS — BP 110/60 | HR 84 | Wt 120.0 lb

## 2015-02-28 DIAGNOSIS — Z1389 Encounter for screening for other disorder: Secondary | ICD-10-CM

## 2015-02-28 DIAGNOSIS — Z331 Pregnant state, incidental: Secondary | ICD-10-CM

## 2015-02-28 DIAGNOSIS — Z3402 Encounter for supervision of normal first pregnancy, second trimester: Secondary | ICD-10-CM

## 2015-02-28 LAB — POCT URINALYSIS DIPSTICK
Glucose, UA: NEGATIVE
KETONES UA: NEGATIVE
Leukocytes, UA: NEGATIVE
Nitrite, UA: NEGATIVE
PROTEIN UA: NEGATIVE
RBC UA: NEGATIVE

## 2015-02-28 NOTE — Progress Notes (Signed)
G1P0 [redacted]w[redacted]d Estimated Date of Delivery: 06/21/15  Blood pressure 110/60, pulse 84, weight 120 lb (54.432 kg), last menstrual period 09/14/2014.   BP weight and urine results all reviewed and noted.  Please refer to the obstetrical flow sheet for the fundal height and fetal heart rate documentation:  Patient reports good fetal movement, denies any bleeding and no rupture of membranes symptoms or regular contractions. Patient is without complaints. All questions were answered.  Orders Placed This Encounter  Procedures  . POCT urinalysis dipstick    Plan:  Continued routine obstetrical care,   Return in about 4 weeks (around 03/28/2015) for LROB, PN2.

## 2015-03-01 ENCOUNTER — Telehealth: Payer: Self-pay | Admitting: Obstetrics & Gynecology

## 2015-03-01 NOTE — Telephone Encounter (Signed)
Pt informed per Dr. Despina Hidden can take multivitamin to help with cramps during pregnancy. Pt verbalized understanding.

## 2015-03-28 ENCOUNTER — Encounter: Payer: Self-pay | Admitting: Obstetrics & Gynecology

## 2015-03-28 ENCOUNTER — Other Ambulatory Visit: Payer: BLUE CROSS/BLUE SHIELD

## 2015-03-28 ENCOUNTER — Ambulatory Visit (INDEPENDENT_AMBULATORY_CARE_PROVIDER_SITE_OTHER): Payer: BLUE CROSS/BLUE SHIELD | Admitting: Obstetrics & Gynecology

## 2015-03-28 VITALS — BP 100/60 | HR 76 | Wt 123.0 lb

## 2015-03-28 DIAGNOSIS — Z369 Encounter for antenatal screening, unspecified: Secondary | ICD-10-CM

## 2015-03-28 DIAGNOSIS — Z331 Pregnant state, incidental: Secondary | ICD-10-CM

## 2015-03-28 DIAGNOSIS — Z131 Encounter for screening for diabetes mellitus: Secondary | ICD-10-CM

## 2015-03-28 DIAGNOSIS — Z3402 Encounter for supervision of normal first pregnancy, second trimester: Secondary | ICD-10-CM

## 2015-03-28 DIAGNOSIS — Z1389 Encounter for screening for other disorder: Secondary | ICD-10-CM

## 2015-03-28 LAB — POCT URINALYSIS DIPSTICK
GLUCOSE UA: NEGATIVE
Leukocytes, UA: NEGATIVE
NITRITE UA: NEGATIVE
Protein, UA: NEGATIVE
RBC UA: NEGATIVE

## 2015-03-28 MED ORDER — OMEPRAZOLE 40 MG PO CPDR: 40.0000 mg | DELAYED_RELEASE_CAPSULE | Freq: Every day | ORAL | Status: DC

## 2015-03-28 NOTE — Progress Notes (Signed)
G1P0 [redacted]w[redacted]d Estimated Date of Delivery: 06/21/15  Blood pressure 100/60, pulse 76, weight 123 lb (55.792 kg), last menstrual period 09/14/2014.   BP weight and urine results all reviewed and noted.  Please refer to the obstetrical flow sheet for the fundal height and fetal heart rate documentation:  Patient reports good fetal movement, denies any bleeding and no rupture of membranes symptoms or regular contractions. Patient is without complaints. All questions were answered.  Orders Placed This Encounter  Procedures  . POCT urinalysis dipstick    Plan:  Continued routine obstetrical care,   Return in about 3 weeks (around 04/18/2015) for LROB.  Meds ordered this encounter  Medications  . omeprazole (PRILOSEC) 40 MG capsule    Sig: Take 1 capsule (40 mg total) by mouth daily.    Dispense:  30 capsule    Refill:  11

## 2015-03-29 LAB — CBC
Hematocrit: 34 % (ref 34.0–46.6)
Hemoglobin: 11.5 g/dL (ref 11.1–15.9)
MCH: 29.7 pg (ref 26.6–33.0)
MCHC: 33.8 g/dL (ref 31.5–35.7)
MCV: 88 fL (ref 79–97)
Platelets: 313 10*3/uL (ref 150–379)
RBC: 3.87 x10E6/uL (ref 3.77–5.28)
RDW: 13.2 % (ref 12.3–15.4)
WBC: 13.1 10*3/uL — AB (ref 3.4–10.8)

## 2015-03-29 LAB — RPR: RPR: NONREACTIVE

## 2015-03-29 LAB — GLUCOSE TOLERANCE, 2 HOURS W/ 1HR
GLUCOSE, 1 HOUR: 154 mg/dL (ref 65–179)
GLUCOSE, FASTING: 74 mg/dL (ref 65–91)
Glucose, 2 hour: 138 mg/dL (ref 65–152)

## 2015-03-29 LAB — HIV ANTIBODY (ROUTINE TESTING W REFLEX): HIV Screen 4th Generation wRfx: NONREACTIVE

## 2015-03-29 LAB — ANTIBODY SCREEN: Antibody Screen: NEGATIVE

## 2015-04-10 ENCOUNTER — Telehealth: Payer: Self-pay | Admitting: Women's Health

## 2015-04-10 DIAGNOSIS — Z3402 Encounter for supervision of normal first pregnancy, second trimester: Secondary | ICD-10-CM

## 2015-04-10 NOTE — Telephone Encounter (Signed)
Pt probably having gallbladder pain. No N/V  Pain is right side under ribs, also in back.  Discussed s/s of pyelo (doubtful). Stones (maybe) but sounds more like GB  Pt wants to watch and wait.

## 2015-04-10 NOTE — Telephone Encounter (Signed)
Pt states that she is having some pain in her right. Pt is unsure if it is growing pains or if it is her gall bladder.

## 2015-04-17 ENCOUNTER — Ambulatory Visit (INDEPENDENT_AMBULATORY_CARE_PROVIDER_SITE_OTHER): Payer: BLUE CROSS/BLUE SHIELD | Admitting: Advanced Practice Midwife

## 2015-04-17 VITALS — BP 100/58 | HR 71 | Wt 126.0 lb

## 2015-04-17 DIAGNOSIS — Z23 Encounter for immunization: Secondary | ICD-10-CM | POA: Diagnosis not present

## 2015-04-17 DIAGNOSIS — Z1389 Encounter for screening for other disorder: Secondary | ICD-10-CM

## 2015-04-17 DIAGNOSIS — Z3403 Encounter for supervision of normal first pregnancy, third trimester: Secondary | ICD-10-CM

## 2015-04-17 DIAGNOSIS — Z331 Pregnant state, incidental: Secondary | ICD-10-CM

## 2015-04-17 LAB — POCT URINALYSIS DIPSTICK
Blood, UA: NEGATIVE
GLUCOSE UA: NEGATIVE
KETONES UA: NEGATIVE
Leukocytes, UA: NEGATIVE
Nitrite, UA: NEGATIVE
Protein, UA: NEGATIVE

## 2015-04-17 NOTE — Progress Notes (Signed)
G1P0 6518w5d Estimated Date of Delivery: 06/21/15  Blood pressure 100/58, pulse 71, weight 126 lb (57.153 kg), last menstrual period 09/14/2014.   BP weight and urine results all reviewed and noted.  Please refer to the obstetrical flow sheet for the fundal height and fetal heart rate documentation:  Patient reports good fetal movement, denies any bleeding and no rupture of membranes symptoms or regular contractions. Patient still having RUQ pain that radiates to back, c/w pain she has felt from her gallbladder. Had been doing well with controlling fats until a few weeks ago. Now pain comes and goes. Also has had terrible leg cramps at night. Tips given All questions were answered.  Orders Placed This Encounter  Procedures  . POCT urinalysis dipstick    Plan:  Continued routine obstetrical care, If pain becomes unmanageable and/or nausea/vomiting, will get US. Pt still wants to wait and watch.    Return in about 2 weeks (around 05/01/2015) for LROB.

## 2015-04-17 NOTE — Patient Instructions (Addendum)
      Why am I having leg cramps during pregnancy?  No one really knows why pregnant women get more leg cramps. It's possible that your leg muscles are tired from carrying around all of your extra weight. Or they may be aggravated by the pressure your expanding uterus puts on the blood vessels that return blood from your legs to your heart and the nerves that lead from your trunk to your legs.  Leg cramps may start to plague you during your second trimester and may get worse as your pregnancy progresses and your belly gets bigger. While these cramps can occur during the day, you'll probably notice them most at night, when they can interfere with your ability to get a good night's sleep.  How can I prevent leg cramps?  Try these tips for keeping leg cramps at bay:  Avoid standing or sitting with your legs crossed for long periods of time. Stretch your calf muscles regularly during the day and several times before you go to bed. Rotate your ankles and wiggle your toes when you sit, eat dinner, or watch TV. Take a walk every day, unless your midwife or doctor has advised you not to exercise. Avoid getting too tired. Lie down on your left side to improve circulation to and from your legs. Stay hydrated during the day by drinking water regularly. Try a warm bath before bed to relax your muscles. Some research suggests that taking a magnesium supplement in addition to a prenatal vitamin may help some women avoid leg cramps. However, other research showed that magnesium supplements had no significant effect on the frequency or intensity of leg cramps during pregnancy.You may have heard that having leg cramps is a sign that you need more calcium, and that calcium supplements will relieve the problem. Though it's certainly important to get enough calcium, there's no good evidence that taking extra calcium will help prevent leg cramps during pregnancy. In fact, in one well-designed study, pregnant women  taking calcium got no more relief from leg cramps than those taking a placebo.  You may try Chelated Magnesium at a dosage of 240-300mg /day.  What's the best way to relieve a cramp when I get one?  If you do get a cramp, immediately stretch your calf muscles: Straighten your leg, heel first, and gently flex your toes back toward your shins. It might hurt at first, but it will ease the spasm and the pain will gradually go away.  You can try to relax the cramp by massaging the muscle or warming it with a hot water bottle. Walking around for a few minutes may help too.  What if the pain persists?  Call your practitioner if your muscle pain is constant and not just an occasional cramp or if you notice swelling, redness, or tenderness in your leg, or the area feels warm to your touch. These may be signs of a blood clot, which requires immediate medical attention. Blood clots are relatively rare, but they're more common during pregnancy.      Tips to Help Leg Cramps  Increase dietary sources of calcium (milk, yogurt, cheese, leafy greens, seafood, legumes, and fruit) and magnesium (dark leafy greens, nuts, seeds, fish, beans, whole grains, avocados, yogurt, bananas, dried fruit, dark chocolate)  Spoonful of regular yellow mustard every night  Pickle juice  Dorsiflexion of foot: pointing your toes back towards your knee during the cramp

## 2015-04-18 ENCOUNTER — Encounter: Payer: BLUE CROSS/BLUE SHIELD | Admitting: Advanced Practice Midwife

## 2015-04-23 ENCOUNTER — Other Ambulatory Visit: Payer: Self-pay | Admitting: Women's Health

## 2015-04-23 ENCOUNTER — Encounter: Payer: Self-pay | Admitting: Advanced Practice Midwife

## 2015-04-23 MED ORDER — PANTOPRAZOLE SODIUM 20 MG PO TBEC: 20.0000 mg | DELAYED_RELEASE_TABLET | Freq: Every day | ORAL | Status: DC

## 2015-05-01 ENCOUNTER — Encounter: Payer: Self-pay | Admitting: Women's Health

## 2015-05-01 ENCOUNTER — Ambulatory Visit (INDEPENDENT_AMBULATORY_CARE_PROVIDER_SITE_OTHER): Payer: BLUE CROSS/BLUE SHIELD | Admitting: Women's Health

## 2015-05-01 VITALS — BP 112/60 | HR 64 | Wt 127.0 lb

## 2015-05-01 DIAGNOSIS — Z331 Pregnant state, incidental: Secondary | ICD-10-CM

## 2015-05-01 DIAGNOSIS — Z1389 Encounter for screening for other disorder: Secondary | ICD-10-CM

## 2015-05-01 DIAGNOSIS — Z3403 Encounter for supervision of normal first pregnancy, third trimester: Secondary | ICD-10-CM

## 2015-05-01 LAB — POCT URINALYSIS DIPSTICK
Blood, UA: NEGATIVE
Glucose, UA: NEGATIVE
Ketones, UA: NEGATIVE
Leukocytes, UA: NEGATIVE
Nitrite, UA: NEGATIVE
Protein, UA: NEGATIVE

## 2015-05-01 NOTE — Patient Instructions (Signed)
Call the office [phone]) or go to [facility] if:  You begin to have strong, frequent contractions  Your water breaks.  Sometimes it is a big gush of fluid, sometimes it is just a trickle that keeps getting your panties wet or running down your legs  You have vaginal bleeding.  It is normal to have a small amount of spotting if your cervix was checked.   You don't feel your baby moving like normal.  If you don't, get you something to eat and drink and lay down and focus on feeling your baby move.  You should feel at least 10 movements in 2 hours.  If you don't, you should call the office or [facility] to [facility].    Tips to Help Leg Cramps  Increase dietary sources of calcium (milk, yogurt, cheese, leafy greens, seafood, legumes, and fruit) and magnesium (dark leafy greens, nuts, seeds, fish, beans, whole grains, avocados, yogurt, bananas, dried fruit, dark chocolate)  Spoonful of regular yellow mustard every night  Pickle juice  Magnesium supplement: 5mmol in the morning, 10mmol at night (can find in the vitamin aisle)  Dorsiflexion of foot: pointing your toes back towards your knee during the cramp      Preterm Labor Information Preterm labor is when labor starts at less than 37 weeks of pregnancy. The normal length of a pregnancy is 39 to 41 weeks. CAUSES Often, there is no identifiable underlying cause as to why a woman goes into preterm labor. One of the most common known causes of preterm labor is infection. Infections of the uterus, cervix, vagina, amniotic sac, bladder, kidney, or even the lungs (pneumonia) can cause labor to start. Other suspected causes of preterm labor include:   Urogenital infections, such as yeast infections and bacterial vaginosis.   Uterine abnormalities (uterine shape, uterine septum, fibroids, or bleeding from the placenta).   A cervix that has been operated on (it may fail to stay closed).   Malformations in the fetus.   Multiple  gestations (twins, triplets, and so on).   Breakage of the amniotic sac.  RISK FACTORS  Having a previous history of preterm labor.   Having premature rupture of membranes (PROM).   Having a placenta that covers the opening of the cervix (placenta previa).   Having a placenta that separates from the uterus (placental abruption).   Having a cervix that is too weak to hold the fetus in the uterus (incompetent cervix).   Having too much fluid in the amniotic sac (polyhydramnios).   Taking illegal drugs or smoking while pregnant.   Not gaining enough weight while pregnant.   Being younger than 7418 and older than 34 years old.   Having a low socioeconomic status.   Being African American. SYMPTOMS Signs and symptoms of preterm labor include:   Menstrual-like cramps, abdominal pain, or back pain.  Uterine contractions that are regular, as frequent as six in an hour, regardless of their intensity (may be mild or painful).  Contractions that start on the top of the uterus and spread down to the lower abdomen and back.   A sense of increased pelvic pressure.   A watery or bloody mucus discharge that comes from the vagina.  TREATMENT Depending on the length of the pregnancy and other circumstances, your health care provider may suggest bed rest. If necessary, there are medicines that can be given to stop contractions and to mature the fetal lungs. If labor happens before 34 weeks of pregnancy, a prolonged  hospital stay may be recommended. Treatment depends on the condition of both you and the fetus.  WHAT SHOULD YOU DO IF YOU THINK YOU ARE IN PRETERM LABOR? Call your health care provider right away. You will need to go to the hospital to get checked immediately. HOW CAN YOU PREVENT PRETERM LABOR IN FUTURE PREGNANCIES? You should:   Stop smoking if you smoke.  Maintain healthy weight gain and avoid chemicals and drugs that are not necessary.  Be watchful for any  type of infection.  Inform your health care provider if you have a known history of preterm labor.   This information is not intended to replace advice given to you by your health care provider. Make sure you discuss any questions you have with your health care provider.   Document Released: 09/05/2003 Document Revised: 02/15/2013 Document Reviewed: 07/18/2012 Elsevier Interactive Patient Education [employer]

## 2015-05-01 NOTE — Progress Notes (Signed)
Low-risk OB appointment G1P0 6055w5d Estimated Date of Delivery: 06/21/15 BP 112/60 mmHg  Pulse 64  Wt 127 lb (57.607 kg)  LMP 09/14/2014  BP, weight, and urine reviewed.  Refer to obstetrical flow sheet for FH & FHR.  Reports good fm.  Denies regular uc's, lof, vb, or uti s/s. Legs cramps- discussed and gave printed info. Reviewed ptl s/s, fkc, normal pn2 results. Plan:  Continue routine obstetrical care  F/U in 2wks for OB appointment

## 2015-05-02 ENCOUNTER — Telehealth: Payer: Self-pay | Admitting: Women's Health

## 2015-05-02 NOTE — Telephone Encounter (Signed)
rx written and put up front

## 2015-05-14 ENCOUNTER — Ambulatory Visit (INDEPENDENT_AMBULATORY_CARE_PROVIDER_SITE_OTHER): Payer: BLUE CROSS/BLUE SHIELD | Admitting: Advanced Practice Midwife

## 2015-05-14 VITALS — BP 120/70 | HR 70 | Wt 131.0 lb

## 2015-05-14 DIAGNOSIS — Z331 Pregnant state, incidental: Secondary | ICD-10-CM

## 2015-05-14 DIAGNOSIS — Z1389 Encounter for screening for other disorder: Secondary | ICD-10-CM

## 2015-05-14 DIAGNOSIS — Z3403 Encounter for supervision of normal first pregnancy, third trimester: Secondary | ICD-10-CM

## 2015-05-14 LAB — POCT URINALYSIS DIPSTICK
Glucose, UA: NEGATIVE
KETONES UA: NEGATIVE
Leukocytes, UA: NEGATIVE
NITRITE UA: NEGATIVE
Protein, UA: NEGATIVE
RBC UA: NEGATIVE

## 2015-05-14 NOTE — Patient Instructions (Signed)

## 2015-05-14 NOTE — Progress Notes (Signed)
G1P0 5840w4d Estimated Date of Delivery: 06/21/15  Blood pressure 120/70, pulse 70, weight 131 lb (59.421 kg), last menstrual period 09/14/2014.   BP weight and urine results all reviewed and noted.  Please refer to the obstetrical flow sheet for the fundal height and fetal heart rate documentation:  Patient reports good fetal movement, denies any bleeding and no rupture of membranes symptoms or regular contractions. Patient is without complaints. All questions were answered.  Orders Placed This Encounter  Procedures  . POCT urinalysis dipstick    Plan:  Continued routine obstetrical care,   Return in about 2 weeks (around 05/28/2015) for LROB.

## 2015-05-15 ENCOUNTER — Encounter: Payer: BLUE CROSS/BLUE SHIELD | Admitting: Obstetrics and Gynecology

## 2015-05-28 ENCOUNTER — Encounter: Payer: Self-pay | Admitting: Obstetrics and Gynecology

## 2015-05-28 ENCOUNTER — Ambulatory Visit (INDEPENDENT_AMBULATORY_CARE_PROVIDER_SITE_OTHER): Payer: BLUE CROSS/BLUE SHIELD | Admitting: Obstetrics and Gynecology

## 2015-05-28 ENCOUNTER — Other Ambulatory Visit: Payer: Self-pay | Admitting: Obstetrics and Gynecology

## 2015-05-28 VITALS — BP 120/80 | HR 81 | Wt 130.5 lb

## 2015-05-28 DIAGNOSIS — Z3493 Encounter for supervision of normal pregnancy, unspecified, third trimester: Secondary | ICD-10-CM

## 2015-05-28 DIAGNOSIS — Z331 Pregnant state, incidental: Secondary | ICD-10-CM

## 2015-05-28 DIAGNOSIS — Z1389 Encounter for screening for other disorder: Secondary | ICD-10-CM

## 2015-05-28 DIAGNOSIS — Z369 Encounter for antenatal screening, unspecified: Secondary | ICD-10-CM

## 2015-05-28 LAB — POCT URINALYSIS DIPSTICK
Glucose, UA: NEGATIVE
KETONES UA: NEGATIVE
LEUKOCYTES UA: NEGATIVE
NITRITE UA: NEGATIVE
PROTEIN UA: NEGATIVE
RBC UA: NEGATIVE

## 2015-05-28 LAB — OB RESULTS CONSOLE GBS: GBS: NEGATIVE

## 2015-05-28 NOTE — Progress Notes (Signed)
Pt denies any problems or concerns at this time.  

## 2015-05-29 LAB — GC/CHLAMYDIA PROBE AMP
CHLAMYDIA, DNA PROBE: NEGATIVE
NEISSERIA GONORRHOEAE BY PCR: NEGATIVE

## 2015-06-01 LAB — STREP GP B CULTURE+RFLX: Strep Gp B Culture+Rflx: NEGATIVE

## 2015-06-04 ENCOUNTER — Ambulatory Visit (INDEPENDENT_AMBULATORY_CARE_PROVIDER_SITE_OTHER): Payer: BLUE CROSS/BLUE SHIELD | Admitting: Obstetrics & Gynecology

## 2015-06-04 VITALS — BP 98/70 | HR 64 | Wt 132.0 lb

## 2015-06-04 DIAGNOSIS — Z1389 Encounter for screening for other disorder: Secondary | ICD-10-CM

## 2015-06-04 DIAGNOSIS — Z3A37 37 weeks gestation of pregnancy: Secondary | ICD-10-CM | POA: Diagnosis not present

## 2015-06-04 DIAGNOSIS — Z3403 Encounter for supervision of normal first pregnancy, third trimester: Secondary | ICD-10-CM

## 2015-06-04 DIAGNOSIS — Z331 Pregnant state, incidental: Secondary | ICD-10-CM

## 2015-06-04 LAB — POCT URINALYSIS DIPSTICK
GLUCOSE UA: NEGATIVE
Ketones, UA: NEGATIVE
Leukocytes, UA: NEGATIVE
Nitrite, UA: NEGATIVE
Protein, UA: NEGATIVE
RBC UA: NEGATIVE

## 2015-06-04 NOTE — Progress Notes (Signed)
G1P0 6924w4d Estimated Date of Delivery: 06/21/15  Blood pressure 98/70, pulse 64, weight 132 lb (59.875 kg), last menstrual period 09/14/2014.   BP weight and urine results all reviewed and noted.  Please refer to the obstetrical flow sheet for the fundal height and fetal heart rate documentation:  Patient reports good fetal movement, denies any bleeding and no rupture of membranes symptoms or regular contractions. Patient is without complaints. All questions were answered.  Orders Placed This Encounter  Procedures  . POCT urinalysis dipstick    Plan:  Continued routine obstetrical care,   Return in about 1 week (around 06/11/2015).

## 2015-06-06 DIAGNOSIS — Z029 Encounter for administrative examinations, unspecified: Secondary | ICD-10-CM

## 2015-06-14 ENCOUNTER — Encounter: Payer: BLUE CROSS/BLUE SHIELD | Admitting: Obstetrics & Gynecology

## 2015-06-14 ENCOUNTER — Inpatient Hospital Stay (HOSPITAL_COMMUNITY)
Admission: AD | Admit: 2015-06-14 | Discharge: 2015-06-14 | Disposition: A | Discharge status: Home / Self Care | Payer: BLUE CROSS/BLUE SHIELD | Source: Ambulatory Visit | Attending: Obstetrics & Gynecology | Admitting: Obstetrics & Gynecology

## 2015-06-14 ENCOUNTER — Inpatient Hospital Stay (HOSPITAL_COMMUNITY)
Admission: AD | Admit: 2015-06-14 | Discharge: 2015-06-17 | DRG: 775 | Disposition: A | Discharge status: Home / Self Care | Payer: BLUE CROSS/BLUE SHIELD | Source: Ambulatory Visit | Attending: Obstetrics & Gynecology | Admitting: Obstetrics & Gynecology

## 2015-06-14 ENCOUNTER — Inpatient Hospital Stay (HOSPITAL_COMMUNITY): Payer: BLUE CROSS/BLUE SHIELD | Admitting: Anesthesiology

## 2015-06-14 ENCOUNTER — Telehealth: Payer: Self-pay | Admitting: *Deleted

## 2015-06-14 ENCOUNTER — Encounter (HOSPITAL_COMMUNITY): Payer: Self-pay | Admitting: *Deleted

## 2015-06-14 ENCOUNTER — Encounter (HOSPITAL_COMMUNITY): Payer: Self-pay

## 2015-06-14 DIAGNOSIS — O99613 Diseases of the digestive system complicating pregnancy, third trimester: Secondary | ICD-10-CM | POA: Diagnosis present

## 2015-06-14 DIAGNOSIS — O4292 Full-term premature rupture of membranes, unspecified as to length of time between rupture and onset of labor: Secondary | ICD-10-CM | POA: Diagnosis present

## 2015-06-14 DIAGNOSIS — O42119 Preterm premature rupture of membranes, onset of labor more than 24 hours following rupture, unspecified trimester: Secondary | ICD-10-CM

## 2015-06-14 DIAGNOSIS — Z87891 Personal history of nicotine dependence: Secondary | ICD-10-CM

## 2015-06-14 DIAGNOSIS — Z3A39 39 weeks gestation of pregnancy: Secondary | ICD-10-CM | POA: Diagnosis not present

## 2015-06-14 DIAGNOSIS — O4202 Full-term premature rupture of membranes, onset of labor within 24 hours of rupture: Secondary | ICD-10-CM

## 2015-06-14 DIAGNOSIS — K219 Gastro-esophageal reflux disease without esophagitis: Secondary | ICD-10-CM | POA: Diagnosis present

## 2015-06-14 DIAGNOSIS — Z3403 Encounter for supervision of normal first pregnancy, third trimester: Secondary | ICD-10-CM

## 2015-06-14 DIAGNOSIS — O4212 Full-term premature rupture of membranes, onset of labor more than 24 hours following rupture: Secondary | ICD-10-CM | POA: Diagnosis present

## 2015-06-14 LAB — TYPE AND SCREEN
ABO/RH(D): O POS
ANTIBODY SCREEN: NEGATIVE

## 2015-06-14 LAB — CBC
HEMATOCRIT: 31 % — AB (ref 36.0–46.0)
Hemoglobin: 10.6 g/dL — ABNORMAL LOW (ref 12.0–15.0)
MCH: 29.2 pg (ref 26.0–34.0)
MCHC: 34.2 g/dL (ref 30.0–36.0)
MCV: 85.4 fL (ref 78.0–100.0)
Platelets: 276 10*3/uL (ref 150–400)
RBC: 3.63 MIL/uL — ABNORMAL LOW (ref 3.87–5.11)
RDW: 13.2 % (ref 11.5–15.5)
WBC: 14.5 10*3/uL — ABNORMAL HIGH (ref 4.0–10.5)

## 2015-06-14 MED ORDER — EPHEDRINE 5 MG/ML INJ
10.0000 mg | INTRAVENOUS | Status: DC | PRN
  Filled 2015-06-14: qty 2

## 2015-06-14 MED ORDER — LIDOCAINE HCL (PF) 1 % IJ SOLN
30.0000 mL | INTRAMUSCULAR | Status: DC | PRN
  Filled 2015-06-14 (×2): qty 30

## 2015-06-14 MED ORDER — ACETAMINOPHEN 325 MG PO TABS: 650.0000 mg | ORAL_TABLET | ORAL | Status: DC | PRN

## 2015-06-14 MED ORDER — CITRIC ACID-SODIUM CITRATE 334-500 MG/5ML PO SOLN: 30.0000 mL | ORAL | Status: DC | PRN

## 2015-06-14 MED ORDER — LACTATED RINGERS IV SOLN
500.0000 mL | INTRAVENOUS | Status: DC | PRN
  Administered 2015-06-14 – 2015-06-15 (×2): 500 mL via INTRAVENOUS

## 2015-06-14 MED ORDER — LIDOCAINE HCL (PF) 1 % IJ SOLN
INTRAMUSCULAR | Status: DC | PRN
  Administered 2015-06-14 (×2): 5 mL via EPIDURAL

## 2015-06-14 MED ORDER — FENTANYL 2.5 MCG/ML BUPIVACAINE 1/10 % EPIDURAL INFUSION (WH - ANES)
14.0000 mL/h | INTRAMUSCULAR | Status: DC | PRN
  Administered 2015-06-14 (×2): 14 mL/h via EPIDURAL
  Filled 2015-06-14: qty 125

## 2015-06-14 MED ORDER — OXYTOCIN BOLUS FROM INFUSION
500.0000 mL | INTRAVENOUS | Status: DC
  Administered 2015-06-15: 500 mL via INTRAVENOUS

## 2015-06-14 MED ORDER — DIPHENHYDRAMINE HCL 50 MG/ML IJ SOLN: 12.5000 mg | INTRAMUSCULAR | Status: DC | PRN

## 2015-06-14 MED ORDER — OXYCODONE-ACETAMINOPHEN 5-325 MG PO TABS: 2.0000 | ORAL_TABLET | ORAL | Status: DC | PRN

## 2015-06-14 MED ORDER — PHENYLEPHRINE 40 MCG/ML (10ML) SYRINGE FOR IV PUSH (FOR BLOOD PRESSURE SUPPORT)
80.0000 ug | PREFILLED_SYRINGE | INTRAVENOUS | Status: DC | PRN
  Filled 2015-06-14: qty 2
  Filled 2015-06-14: qty 20

## 2015-06-14 MED ORDER — OXYCODONE-ACETAMINOPHEN 5-325 MG PO TABS
1.0000 | ORAL_TABLET | ORAL | Status: DC | PRN
Start: 2015-06-14 — End: 2015-06-15

## 2015-06-14 MED ORDER — ONDANSETRON HCL 4 MG/2ML IJ SOLN
4.0000 mg | Freq: Four times a day (QID) | INTRAMUSCULAR | Status: DC | PRN
Start: 2015-06-14 — End: 2015-06-15

## 2015-06-14 MED ORDER — LACTATED RINGERS IV SOLN
INTRAVENOUS | Status: DC
Start: 2015-06-14 — End: 2015-06-15
  Administered 2015-06-14 – 2015-06-15 (×2): via INTRAVENOUS

## 2015-06-14 MED ORDER — OXYTOCIN 40 UNITS IN LACTATED RINGERS INFUSION - SIMPLE MED
62.5000 mL/h | INTRAVENOUS | Status: DC
  Administered 2015-06-15: 62.5 mL/h via INTRAVENOUS
  Filled 2015-06-14 (×2): qty 1000

## 2015-06-14 NOTE — Discharge Instructions (Signed)
Keep your scheduled appointment for prenatal care. Call the office or provider on call with further concerns or return to [facility] as needed. °

## 2015-06-14 NOTE — H&P (Signed)
LABOR ADMISSION HISTORY AND PHYSICAL  Mindy Bright is a [age] y.o. female G1P0 with IUP at 8463w0d presenting for active labor with PROM @ 6:45 PM. She reports +FM, + contractions, No LOF, no VB, no blurry vision, headaches or peripheral edema, and RUQ pain.  She plans on breast feeding. She request OCPs for birth control.  Dating: By LMP/U/S --->  Estimated Date of Delivery: 06/21/15  Sono:    @[redacted]w[redacted]d , CWD, normal anatomy, cephalic presentation, 321 g,   Prenatal History/Complications:  [facility]  Initiated Care at  6 weeks  FOB Mindy Bright  Dating By LMP/1st trimester US  Pap 11/21/2014 norma;l  GC/CT Initial:-/-                36+wks:  Genetic Screen NT/IT: neg  CF screen negative  Anatomic US Normal female 'Mindy Bright'  Flu vaccine 04/17/2015   Tdap Recommended ~ 28wks  Glucose Screen  2 hr  74/154/138  GBS   Feed Preference breast  Contraception POPs  Circumcision n/a  Childbirth Classes Went to baby care classes  Pediatrician [facility]     Past Medical History: Past Medical History  Diagnosis Date  . Anxiety   . H/O hiatal hernia   . GERD (gastroesophageal reflux disease)   . Gall bladder disease   . Abnormal Pap smear   . Vaginal discharge 12/11/2013  . Unspecified symptom associated with female genital organs 12/11/2013  . Patient desires pregnancy 12/11/2013  . Irregular periods 04/30/2014  . Pregnant 10/26/2014  . Reflux 11/09/2014  . Vaginal Pap smear, abnormal     >10 years ago, no treatment    Past Surgical History: Past Surgical History  Procedure Laterality Date  . Wisdom tooth extraction      Obstetrical History: OB History    Gravida Para Term Preterm AB TAB SAB Ectopic Multiple Living   1               Social History: Social History   Social History  . Marital Status: Married    Spouse Name: N/A  . Number of Children: N/A  . Years of Education: N/A   Social History Main Topics  . Smoking status: Former Smoker -- 0.25  packs/day for 3 years    Types: Cigarettes    Quit date: 07/14/2002  . Smokeless tobacco: Never Used  . Alcohol Use: No     Comment: not now  . Drug Use: No  . Sexual Activity: Yes    Birth Control/ Protection: None   Other Topics Concern  . None   Social History Narrative    Family History: Family History  Problem Relation Age of Onset  . Diabetes Mother   . Heart disease Paternal Grandfather   . Diabetes Paternal Grandfather   . Diabetes Maternal Grandmother   . Hypertension Father   . Heart disease Maternal Grandfather   . Gout Brother     Allergies: Allergies  Allergen Reactions  . Penicillins Swelling    Has patient had a PCN reaction causing immediate rash, facial/tongue/throat swelling, SOB or lightheadedness with hypotension: Yes Has patient had a PCN reaction causing severe rash involving mucus membranes or skin necrosis: No Has patient had a PCN reaction that required hospitalization Yes Has patient had a PCN reaction occurring within the last 10 years: No If all of the above answers are "NO", then may proceed with Cephalosporin use.    Prescriptions prior to admission  Medication Sig Dispense Refill Last Dose  .  acetaminophen (TYLENOL) 500 MG tablet Take 500 mg by mouth every 6 (six) hours as needed for moderate pain.   06/14/2015 at Unknown time  . omeprazole (PRILOSEC) 40 MG capsule Take 1 capsule (40 mg total) by mouth daily. 30 capsule 11 06/14/2015 at Unknown time  . Prenatal Vit-Fe Fumarate-FA (MULTIVITAMIN-PRENATAL) 27-0.8 MG TABS tablet Take 1 tablet by mouth daily at 12 noon.   06/13/2015 at Unknown time     Review of Systems   All systems reviewed and negative except as stated in HPI  BP 133/90 mmHg  Pulse 71  Temp(Src) 97.5 F (36.4 C) (Oral)  Resp 18  Ht  (1.575 m)  Wt 132 lb (59.875 kg)  BMI 24.14 kg/m2  SpO2 98%  LMP 09/14/2014 General appearance: alert and cooperative Lungs: clear to auscultation bilaterally Heart: regular  rate and rhythm Abdomen: soft, non-tender; bowel sounds normal Extremities: Homans sign is negative, no sign of DVT, edema Presentation: cephalic Fetal monitoringBaseline: 120 bpm, Variability: Fair (1-6 bpm), Accelerations: Reactive and Decelerations: Absent Uterine activity: Contractions every 2-3 minutes  Dilation: 4.5 Effacement (%): 80 Station: 0 Exam by:: b. boyer rn   Prenatal labs: ABO, Rh: --/--/O POS (12/16 2010) Antibody: NEG (12/16 2010) Rubella: !Error! RPR: Non Reactive (09/29 0858)  HBsAg: Negative (05/25 6045)  HIV: Non Reactive (09/29 0858)  GBS: Negative (11/29 0000)  1 hr Glucola 154, 2 hour 138  Genetic screening  NT/IT negative  Anatomy US Normal   Prenatal Transfer Tool  Maternal Diabetes: No Genetic Screening: Normal Maternal Ultrasounds/Referrals: Normal Fetal Ultrasounds or other Referrals:  None Maternal Substance Abuse:  No Significant Maternal Medications:  None Significant Maternal Lab Results: None  Results for orders placed or performed during the hospital encounter of 06/14/15 (from the past 24 hour(s))  CBC   Collection Time: 06/14/15  8:10 PM  Result Value Ref Range   WBC 14.5 (H) 4.0 - 10.5 K/uL   RBC 3.63 (L) 3.87 - 5.11 MIL/uL   Hemoglobin 10.6 (L) 12.0 - 15.0 g/dL   HCT 40.9 (L) 81.1 - 91.4 %   MCV 85.4 78.0 - 100.0 fL   MCH 29.2 26.0 - 34.0 pg   MCHC 34.2 30.0 - 36.0 g/dL   RDW 78.2 95.6 - 21.3 %   Platelets 276 150 - 400 K/uL  Type and screen [facility]   Collection Time: 06/14/15  8:10 PM  Result Value Ref Range   ABO/RH(D) O POS    Antibody Screen NEG    Sample Expiration 06/17/2015     Patient Active Problem List   Diagnosis Date Noted  . Premature rupture of membranes (PROM) at term with onset of labor after 24 hours, antepartum 06/14/2015  . Reflux 11/09/2014  . Supervision of normal first pregnancy 10/26/2014  . Irregular periods 04/30/2014    Assessment: Mindy Bright is a 34 y.o. G1P0  at [redacted]w[redacted]d here for active labor, PROM :45 PM   #Labor: Expectant management  #Pain: Epidural  #FWB: Category 1  #ID: GBS  Negative  #MOF: Breast  #MOC:OCPs #Circ:  N/A  Noralee Chars, MD    I have seen this patient and agree with the above resident's note.  LEFTWICH-KIRBY, LISA Certified Nurse-Midwife

## 2015-06-14 NOTE — Anesthesia Procedure Notes (Signed)
Epidural Patient location during procedure: OB Start time: 06/14/2015 9:10 PM End time: 06/14/2015 9:14 PM  Staffing Anesthesiologist: Leilani AbleHATCHETT, Josey Forcier Performed by: anesthesiologist   Preanesthetic Checklist Completed: patient identified, surgical consent, pre-op evaluation, timeout performed, IV checked, risks and benefits discussed and monitors and equipment checked  Epidural Patient position: sitting Prep: site prepped and draped and DuraPrep Patient monitoring: continuous pulse ox and blood pressure Approach: midline Location: L3-L4 Injection technique: LOR air  Needle:  Needle type: Tuohy  Needle gauge: 17 G Needle length: 9 cm and 9 Needle insertion depth: 4 cm Catheter type: closed end flexible Catheter size: 19 Gauge Catheter at skin depth: 9 cm Test dose: negative and Other  Assessment Sensory level: T9 Events: blood not aspirated, injection not painful, no injection resistance, negative IV test and no paresthesia  Additional Notes Reason for block:procedure for pain

## 2015-06-14 NOTE — Telephone Encounter (Signed)
Pt c/o sharp back pain 5-7 min a part, no gush of fluids, not having any abdominal contractions. [facility] Pt verbalized understanding.

## 2015-06-14 NOTE — MAU Note (Signed)
Patient presents with PROM at 1800 and ctx every 2-3 mins

## 2015-06-14 NOTE — MAU Note (Signed)
States she feels intermittent lower back pain. States she feels no pain in her abdomen. Called office and was advised to come in for labor eval.

## 2015-06-14 NOTE — Anesthesia Preprocedure Evaluation (Signed)
Anesthesia Evaluation  Patient identified by MRN, date of birth, ID band Patient awake    Reviewed: Allergy & Precautions, H&P , NPO status , Patient's Chart, lab work & pertinent test results  Airway Mallampati: I  TM Distance: >3 FB Neck ROM: full    Dental no notable dental hx.    Pulmonary former smoker,    Pulmonary exam normal        Cardiovascular negative cardio ROS Normal cardiovascular exam     Neuro/Psych negative neurological ROS     GI/Hepatic Neg liver ROS,   Endo/Other  negative endocrine ROS  Renal/GU negative Renal ROS     Musculoskeletal   Abdominal Normal abdominal exam  (+)   Peds  Hematology negative hematology ROS (+)   Anesthesia Other Findings   Reproductive/Obstetrics (+) Pregnancy                             Anesthesia Physical Anesthesia Plan  ASA: II  Anesthesia Plan: Epidural   Post-op Pain Management:    Induction:   Airway Management Planned:   Additional Equipment:   Intra-op Plan:   Post-operative Plan:   Informed Consent: I have reviewed the patients History and Physical, chart, labs and discussed the procedure including the risks, benefits and alternatives for the proposed anesthesia with the patient or authorized representative who has indicated his/her understanding and acceptance.     Plan Discussed with:   Anesthesia Plan Comments:         Anesthesia Quick Evaluation

## 2015-06-14 NOTE — Progress Notes (Signed)
Mindy Bright is a 34 y.o. G1P0 at 508w0d by LMP admitted for active labor, rupture of membranes  Subjective: Pt comfortable with epidural.  S/O in room for support.  Objective: BP 120/67 mmHg  Pulse 90  Temp(Src) 97.5 F (36.4 C) (Oral)  Resp 18  Ht 5\' 2"  (1.575 m)  Wt 59.875 kg (132 lb)  BMI 24.14 kg/m2  SpO2 99%  LMP 09/14/2014      FHT:  FHR: 145 bpm, variability: moderate,  accelerations:  Present,  decelerations:  Present episodic variables UC:   irregular, every 2-4 minutes SVE:   Dilation: 8 Effacement (%): 100 Station: +1 Exam by:: Fintan Grater cnm Vertex  Labs: Lab Results  Component Value Date   WBC 14.5* 06/14/2015   HGB 10.6* 06/14/2015   HCT 31.0* 06/14/2015   MCV 85.4 06/14/2015   PLT 276 06/14/2015    Assessment / Plan: Spontaneous labor, progressing normally  Labor: Progressing normally Preeclampsia:  n/a Fetal Wellbeing:  Category II Pain Control:  Epidural I/D:  n/a Anticipated MOD:  NSVD  LEFTWICH-KIRBY, [name] 06/14/2015, 10:59 PM

## 2015-06-15 ENCOUNTER — Encounter (HOSPITAL_COMMUNITY): Payer: Self-pay

## 2015-06-15 DIAGNOSIS — Z87891 Personal history of nicotine dependence: Secondary | ICD-10-CM

## 2015-06-15 DIAGNOSIS — K219 Gastro-esophageal reflux disease without esophagitis: Secondary | ICD-10-CM

## 2015-06-15 DIAGNOSIS — O99613 Diseases of the digestive system complicating pregnancy, third trimester: Secondary | ICD-10-CM

## 2015-06-15 DIAGNOSIS — Z3A39 39 weeks gestation of pregnancy: Secondary | ICD-10-CM

## 2015-06-15 DIAGNOSIS — O4292 Full-term premature rupture of membranes, unspecified as to length of time between rupture and onset of labor: Secondary | ICD-10-CM

## 2015-06-15 LAB — ABO/RH: ABO/RH(D): O POS

## 2015-06-15 LAB — HIV ANTIBODY (ROUTINE TESTING W REFLEX): HIV Screen 4th Generation wRfx: NONREACTIVE

## 2015-06-15 LAB — RPR: RPR Ser Ql: NONREACTIVE

## 2015-06-15 MED ORDER — TETANUS-DIPHTH-ACELL PERTUSSIS 5-2.5-18.5 LF-MCG/0.5 IM SUSP
0.5000 mL | Freq: Once | INTRAMUSCULAR | Status: AC
  Administered 2015-06-17: 0.5 mL via INTRAMUSCULAR
  Filled 2015-06-15: qty 0.5

## 2015-06-15 MED ORDER — ZOLPIDEM TARTRATE 5 MG PO TABS: 5.0000 mg | ORAL_TABLET | Freq: Every evening | ORAL | Status: DC | PRN

## 2015-06-15 MED ORDER — SIMETHICONE 80 MG PO CHEW: 80.0000 mg | CHEWABLE_TABLET | ORAL | Status: DC | PRN

## 2015-06-15 MED ORDER — BENZOCAINE-MENTHOL 20-0.5 % EX AERO
1.0000 "application " | INHALATION_SPRAY | CUTANEOUS | Status: DC | PRN
  Administered 2015-06-15: 1 via TOPICAL
  Filled 2015-06-15: qty 56

## 2015-06-15 MED ORDER — TERBUTALINE SULFATE 1 MG/ML IJ SOLN
0.2500 mg | Freq: Once | INTRAMUSCULAR | Status: DC | PRN
  Filled 2015-06-15: qty 1

## 2015-06-15 MED ORDER — ACETAMINOPHEN 325 MG PO TABS
650.0000 mg | ORAL_TABLET | ORAL | Status: DC | PRN
Start: 2015-06-15 — End: 2015-06-17
  Administered 2015-06-16: 650 mg via ORAL
  Filled 2015-06-15: qty 2

## 2015-06-15 MED ORDER — IBUPROFEN 600 MG PO TABS
600.0000 mg | ORAL_TABLET | Freq: Four times a day (QID) | ORAL | Status: DC
  Administered 2015-06-15 – 2015-06-17 (×9): 600 mg via ORAL
  Filled 2015-06-15 (×9): qty 1

## 2015-06-15 MED ORDER — OXYCODONE-ACETAMINOPHEN 5-325 MG PO TABS: 1.0000 | ORAL_TABLET | ORAL | Status: DC | PRN

## 2015-06-15 MED ORDER — WITCH HAZEL-GLYCERIN EX PADS: 1.0000 "application " | MEDICATED_PAD | CUTANEOUS | Status: DC | PRN

## 2015-06-15 MED ORDER — OXYCODONE-ACETAMINOPHEN 5-325 MG PO TABS: 2.0000 | ORAL_TABLET | ORAL | Status: DC | PRN

## 2015-06-15 MED ORDER — LANOLIN HYDROUS EX OINT: TOPICAL_OINTMENT | CUTANEOUS | Status: DC | PRN

## 2015-06-15 MED ORDER — OXYTOCIN 40 UNITS IN LACTATED RINGERS INFUSION - SIMPLE MED
1.0000 m[IU]/min | INTRAVENOUS | Status: DC
  Administered 2015-06-15: 2 m[IU]/min via INTRAVENOUS

## 2015-06-15 MED ORDER — DIBUCAINE 1 % RE OINT: 1.0000 "application " | TOPICAL_OINTMENT | RECTAL | Status: DC | PRN

## 2015-06-15 MED ORDER — ONDANSETRON HCL 4 MG PO TABS: 4.0000 mg | ORAL_TABLET | ORAL | Status: DC | PRN

## 2015-06-15 MED ORDER — PRENATAL MULTIVITAMIN CH
1.0000 | ORAL_TABLET | Freq: Every day | ORAL | Status: DC
  Administered 2015-06-15 – 2015-06-17 (×3): 1 via ORAL
  Filled 2015-06-15 (×3): qty 1

## 2015-06-15 MED ORDER — DIPHENHYDRAMINE HCL 25 MG PO CAPS: 25.0000 mg | ORAL_CAPSULE | Freq: Four times a day (QID) | ORAL | Status: DC | PRN

## 2015-06-15 MED ORDER — ONDANSETRON HCL 4 MG/2ML IJ SOLN: 4.0000 mg | INTRAMUSCULAR | Status: DC | PRN

## 2015-06-15 MED ORDER — SENNOSIDES-DOCUSATE SODIUM 8.6-50 MG PO TABS
2.0000 | ORAL_TABLET | ORAL | Status: DC
  Administered 2015-06-16 (×2): 2 via ORAL
  Filled 2015-06-15 (×2): qty 2

## 2015-06-15 MED ORDER — KETOROLAC TROMETHAMINE 30 MG/ML IJ SOLN
30.0000 mg | Freq: Once | INTRAMUSCULAR | Status: DC
  Filled 2015-06-15: qty 1

## 2015-06-15 NOTE — Progress Notes (Signed)
Acknowledged order for social work consult to address concerns regarding mother hx of anxiety.  Met briefly with mother.  However, the conversation was disrupted and upon return mother was asleep.

## 2015-06-15 NOTE — Anesthesia Postprocedure Evaluation (Signed)
Anesthesia Post Note  Patient: Mindy DadaMelissa C Gunning  Procedure(s) Performed: * No procedures listed *  Patient location during evaluation: Mother Baby Anesthesia Type: Epidural Level of consciousness: awake and alert and oriented Pain management: satisfactory to patient Vital Signs Assessment: post-procedure vital signs reviewed and stable Respiratory status: spontaneous breathing and nonlabored ventilation Cardiovascular status: stable Postop Assessment: no headache, no backache, no signs of nausea or vomiting, adequate PO intake and patient able to bend at knees (patient up walking) Anesthetic complications: no    Last Vitals:  Filed Vitals:   06/15/15 0915 06/15/15 1300  BP: 118/65 124/59  Pulse: 70 63  Temp: 36.8 C 36.7 C  Resp: 16 16    Last Pain:  Filed Vitals:   06/15/15 1318  PainSc: 5                  Triston Lisanti

## 2015-06-15 NOTE — Lactation Note (Signed)
This note was copied from the chart of Mindy Bright. Lactation Consultation Note  Patient Name: Mindy Garen LahMelissa Frith ZOXWR'UToday's Date: 06/15/2015 Reason for consult: Initial assessment  With this first time mom and term baby, weighing 5 lbs 13 oz, with last blood glucose 30.  Baby was given glucose gel in mouth by RN. I was asked by Marcelino DusterMichelle, RN, to see if I could hand express colostru from mom to feed the baby. I was able to express about 0.7 ml's of colostrum, which was syringe fed/finger sucking to the baby.Mom has baby skin to skin. Repeat glucose to be drawn in an hour.    Maternal Data Formula Feeding for Exclusion: No Has patient been taught Hand Expression?: Yes Does the patient have breastfeeding experience prior to this delivery?: No  Feeding Feeding Type: Breast Fed Length of feed: 15 min  LATCH Score/Interventions Latch: Too sleepy or reluctant, no latch achieved, no sucking elicited. Intervention(s): Skin to skin Intervention(s): Assist with latch;Adjust position;Breast compression  Audible Swallowing: None Intervention(s): Skin to skin;Hand expression Intervention(s): Skin to skin;Hand expression  Type of Nipple: Everted at rest and after stimulation  Comfort (Breast/Nipple): Soft / non-tender     Hold (Positioning): Assistance needed to correctly position infant at breast and maintain latch. Intervention(s): Breastfeeding basics reviewed;Support Pillows;Skin to skin  LATCH Score: 5  Lactation Tools Discussed/Used Tools: Pump Breast pump type: Double-Electric Breast Pump Date initiated:: 06/15/15   Consult Status Consult Status: Follow-up Date: 06/16/15 Follow-up type: In-patient    Alfred LevinsLee, Delcenia Inman Anne 06/15/2015, 1:23 PM

## 2015-06-15 NOTE — Progress Notes (Addendum)
[facility] 0815 [facility].  Baby SGA (less than 6 lbs.) and [facility]. Breastfeeding was not attempted, per patient, prior to transfer though baby was kept skin to skin for 60 minutes.  This RN set up baby and mom to breastfeed upon getting report at 10820. CBG at 0820=33. Nursery aware and following up.

## 2015-06-15 NOTE — Progress Notes (Signed)
Was notified a this time  infant had not nursed. Infant had not nursed, but had remained skin to skin since delivery. Pt was up to bathroom at this time.. Transferred pt to room 122 and informed Evangeline GulaKaren RN that infant had not nursed. Nursery aware.

## 2015-06-16 ENCOUNTER — Encounter (HOSPITAL_COMMUNITY): Payer: Self-pay | Admitting: Advanced Practice Midwife

## 2015-06-16 NOTE — Progress Notes (Signed)
Post Partum Day 1 Subjective: no complaints  Objective: Blood pressure 119/57, pulse 60, temperature 97.4 F (36.3 C), temperature source Oral, resp. rate 18, height 5\' 2"  (1.575 m), weight 59.875 kg (132 lb), last menstrual period 09/14/2014, SpO2 99 %, unknown if currently breastfeeding.  Physical Exam:  General: alert, cooperative and no distress Lochia: appropriate Uterine Fundus: firm Incision: n/a  DVT Evaluation: No evidence of DVT seen on physical exam.   Recent Labs  06/14/15 2010  HGB 10.6*  HCT 31.0*    Assessment/Plan: Plan for discharge tomorrow, Breastfeeding and Contraception POP   LOS: 2 days   ARNOLD,JAMES 06/16/2015, 7:10 AM

## 2015-06-16 NOTE — Lactation Note (Signed)
This note was copied from the chart of Girl Garen LahMelissa Rushing. Lactation Consultation Note  Patient Name: Girl Garen LahMelissa Levandowski KZSWF'UToday's Date: 06/16/2015 Reason for consult: Follow-up assessment;Infant < 6lbs;Difficult latch  Baby 29 hours old. Parents report that baby has not been nursing well--not latching well, sleepy at breasts, and short feeds when she does latch. Assisted mom to latch baby to right breast in football position. Discussed positioning and enc hand expressing drops of colostrum into baby's mouth enc her to open her mouth wide. Stimulated baby to suckle by letting baby suckle this LC's gloved finger. Baby latched deeply, suckling rhythmically with a few swallows noted. Parents thrilled that baby nursing, and baby suckled with stimulation for 20 minutes. Enc mom not to hold baby's scalp, but instead to support baby by holding her neck--to achieve deeper latch and avoid bruised area of head. Mom has easily compressible breasts with EBM flowing well now. Enc mom to post-pump. Mom given LPI sheet with review because baby little and acting so sleepy. Enc limiting total feed time to 30 minutes and reducing additional stimulation of baby.  Plan is to nurse with cues, and at least every 3 hours. Enc supplementation after each BF with EBM/formula according to supplementation guideline, and then post pump for 15 minutes followed by hand expression. Enc parents to call for assistance with latching as needed. Parents are supplementing with syringe and finger at this point.   Discussed assessment and interventions with patient's bedside nurse Clydie BraunKaren, RN. Maternal Data    Feeding Feeding Type: Breast Fed Length of feed: 20 min  LATCH Score/Interventions Latch: Grasps breast easily, tongue down, lips flanged, rhythmical sucking. Intervention(s): Skin to skin;Waking techniques Intervention(s): Adjust position;Assist with latch;Breast compression  Audible Swallowing: A few with  stimulation Intervention(s): Skin to skin;Hand expression  Type of Nipple: Everted at rest and after stimulation  Comfort (Breast/Nipple): Soft / non-tender     Hold (Positioning): Assistance needed to correctly position infant at breast and maintain latch. Intervention(s): Breastfeeding basics reviewed;Support Pillows;Position options;Skin to skin  LATCH Score: 8  Lactation Tools Discussed/Used Breast pump type: Double-Electric Breast Pump   Consult Status Consult Status: Follow-up Date: 06/17/15 Follow-up type: In-patient    Geralynn OchsWILLIARD, Jesten Cappuccio 06/16/2015, 12:08 PM

## 2015-06-16 NOTE — Lactation Note (Signed)
This note was copied from the chart of Mindy Bright. Lactation Consultation Note  Patient Name: Mindy Garen LahMelissa Barcomb ZOXWR'UToday's Date: 06/16/2015 Reason for consult: Follow-up assessment RN requested help with latch. Baby is SGA, elevated bili, and low BG. Mom is post pumping and supplementing with formula per LPT infant guidelines. Mom was able to latch baby after multiple demonstrations to the R breast unassisted. Mom was not as comfortable latching baby tot he L breast but encouraged her to keep trying.    Maternal Data    Feeding Feeding Type: Breast Fed Length of feed: 20 min  LATCH Score/Interventions Latch: Repeated attempts needed to sustain latch, nipple held in mouth throughout feeding, stimulation needed to elicit sucking reflex. Intervention(s): Skin to skin Intervention(s): Assist with latch;Adjust position;Breast compression  Audible Swallowing: Spontaneous and intermittent Intervention(s): Hand expression Intervention(s): Alternate breast massage  Type of Nipple: Everted at rest and after stimulation  Comfort (Breast/Nipple): Soft / non-tender     Hold (Positioning): Assistance needed to correctly position infant at breast and maintain latch. Intervention(s): Support Pillows;Position options  LATCH Score: 8  Lactation Tools Discussed/Used     Consult Status Consult Status: Follow-up Date: 06/17/15 Follow-up type: In-patient    Rulon Eisenmengerlizabeth E Narek Kniss 06/16/2015, 6:54 PM

## 2015-06-17 ENCOUNTER — Ambulatory Visit: Payer: Self-pay

## 2015-06-17 MED ORDER — NORETHINDRONE 0.35 MG PO TABS: 1.0000 | ORAL_TABLET | Freq: Every day | ORAL | Status: DC

## 2015-06-17 MED ORDER — OXYCODONE-ACETAMINOPHEN 5-325 MG PO TABS: 1.0000 | ORAL_TABLET | Freq: Four times a day (QID) | ORAL | Status: DC | PRN

## 2015-06-17 MED ORDER — IBUPROFEN 600 MG PO TABS: 600.0000 mg | ORAL_TABLET | Freq: Four times a day (QID) | ORAL | Status: DC

## 2015-06-17 NOTE — Clinical Social Work Maternal (Signed)
  CLINICAL SOCIAL WORK MATERNAL/CHILD NOTE  Patient Details  Name: Mindy Bright MRN: [id number] Date of Birth: 02/20/1981  Date:  06/17/2015  Clinical Social Worker Initiating Note:  Aeva Posey MSW, LCSW Date/ Time Initiated:  06/17/15/0930     Child's Name:  MaKenna Grassia   Legal Guardian:  Golden and Josh Torrens  Need for Interpreter:  None   Date of Referral:  06/15/15     Reason for Referral:  History of anxiety  Referral Source:  Central Nursery   Address:  [address] [place], [place] [address]  Phone number:  [phone]   Household Members:  Spouse   Natural Supports (not living in the home):  Immediate Family   Professional Supports: None   Employment: Full-time   Type of Work: Teacher   Education:  College graduate   Financial Resources:  Private Insurance   Other Resources:    None identified   Cultural/Religious Considerations Which May Impact Care:  None reported  Strengths:  Ability to meet basic needs , Pediatrician chosen , Home prepared for child    Risk Factors/Current Problems:  None   Cognitive State:  Able to Concentrate , Alert , Goal Oriented , Insightful , Linear Thinking    Mood/Affect:  Bright , Happy , Comfortable , Calm    CSW Assessment:  CSW received request for consult due to MOB presenting with a history of anxiety.   MOB and FOB presented as easily engaged and receptive to the visit. MOB displayed a full range in affect and was in a pleasant mood despite reports of feeling tired and exhausted.  MOB and FOB expressed feelings of eagerness and happiness secondary to the infant's birth. MOB and FOB reported that the childbirth experience and breastfeeding has gone smoothly, and expectations had been met.  MOB and FOB reported initial difficulties associating with breastfeeding, but shared that they are approaching feedings with patience since they know that both MOB and infant are needing to learn how to do  all of these activities and behaviors.  MOB and FOB endorsed presence of strong support system, and stated that their mothers live within close driving distance of the home and will continue to provide support. MOB and FOB reported that they also have the support from their employers.    Per MOB, history of anxiety occurred while in college, but denied any complications or symptoms since then. MOB denied mental health complications during the pregnancy. CSW provided education on the Baby Blues and perinatal mood and anxiety disorders. CSW discussed commonality of symptoms, evidence based treatments, and encouraged family to discuss any mood concerns with their medical provider if they become concerned about onset of symptoms. MOB and FOB agreeable, and appeared attentive and engaged during the education.   MOB and FOB denied current questions, concerns, or needs. They expressed appreciation for the visit, and agreed to contact CSW if additional needs arise.  CSW Plan/Description:   1. Patient/Family Education- Perinatal mood and anxiety disorders  2. No Further Intervention Required/No Barriers to Discharge    Kati Riggenbach N, LCSW 06/17/2015, 1:04 PM  

## 2015-06-17 NOTE — Discharge Summary (Signed)
OB Discharge Summary  Patient Name: Mindy DadaMelissa C Roanhorse DOB: [birth year] MRN: [id number]  Date of admission: 06/14/2015 Delivering MD: Sharen CounterLEFTWICH-KIRBY, LISA A   Date of discharge: 06/17/2015  Admitting diagnosis: 39w water broke,ctx 2-3 min Intrauterine pregnancy: 7870w1d     Secondary diagnosis:Active Problems:   Premature rupture of membranes (PROM) at term with onset of labor after 24 hours, antepartum  Additional problems: None     Discharge diagnosis: Term Pregnancy Delivered                                                                     Post partum procedures:None  Augmentation: Pitocin  Complications: None  Hospital course:  Onset of Labor With Vaginal Delivery     34 y.o. yo G1P1001 at 5370w1d was admitted in Latent Laboron 06/14/2015. Patient had an uncomplicated labor course as follows:  Membrane Rupture Time/Date: 6:00 PM ,06/14/2015   Intrapartum Procedures: Episiotomy: None [1]                                         Lacerations:  2nd degree [3];Perineal [11]  Patient had a delivery of a Viable infant. 06/15/2015  Information for the patient's newborn:  Odis LusterBowers, Girl [name] [[id number]][[id number]]  Delivery Method: Vaginal, Spontaneous Delivery (Filed from Delivery Summary)     Pateint had an uncomplicated postpartum course.  She is ambulating, tolerating a regular diet, passing flatus, and urinating well. Patient is discharged home in stable condition on No discharge date for patient encounter.Marland Kitchen.    Physical exam  Filed Vitals:   06/15/15 1300 06/16/15 0606 06/16/15 1801 06/17/15 0621  BP: 124/59 119/57 113/65 115/78  Pulse: 63 60 64 56  Temp: 98 F (36.7 C) 97.4 F (36.3 C) 97.9 F (36.6 C) 97.8 F (36.6 C)  TempSrc: Oral Oral Oral Oral  Resp: 16 18 18 18   Height:      Weight:      SpO2:   100% 100%   General: alert, cooperative and no distress Lochia: appropriate Uterine Fundus: firm Incision: N/A DVT Evaluation: No evidence of DVT seen on physical  exam. No significant calf/ankle edema. Labs: Lab Results  Component Value Date   WBC 14.5* 06/14/2015   HGB 10.6* 06/14/2015   HCT 31.0* 06/14/2015   MCV 85.4 06/14/2015   PLT 276 06/14/2015   CMP Latest Ref Rng 11/15/2014  Glucose 65 - 99 mg/dL 295(M121(H)  BUN 6 - 20 mg/dL 12  Creatinine 8.410.44 - 3.241.00 mg/dL 4.010.68  Sodium 027135 - 253145 mmol/L 133(L)  Potassium 3.5 - 5.1 mmol/L 3.7  Chloride 101 - 111 mmol/L 102  CO2 22 - 32 mmol/L 23  Calcium 8.9 - 10.3 mg/dL 9.0  Total Protein 6.5 - 8.1 g/dL 6.9  Total Bilirubin 0.3 - 1.2 mg/dL 0.4  Alkaline Phos 38 - 126 U/L 60  AST 15 - 41 U/L 18  ALT 14 - 54 U/L 14    Discharge instruction: per After Visit Summary and "Baby and Me Booklet".  After Visit Meds:    Medication List    STOP taking these medications        acetaminophen  500 MG tablet  Commonly known as:  TYLENOL     multivitamin-prenatal 27-0.8 MG Tabs tablet      TAKE these medications        ibuprofen 600 MG tablet  Commonly known as:  ADVIL,MOTRIN  Take 1 tablet (600 mg total) by mouth every 6 (six) hours.     norethindrone 0.35 MG tablet  Commonly known as:  ORTHO MICRONOR  Take 1 tablet (0.35 mg total) by mouth daily.     omeprazole 40 MG capsule  Commonly known as:  PRILOSEC  Take 1 capsule (40 mg total) by mouth daily.     oxyCODONE-acetaminophen 5-325 MG tablet  Commonly known as:  PERCOCET/ROXICET  Take 1 tablet by mouth every 6 (six) hours as needed (for pain scale 4-7).        Diet: routine diet  Activity: Advance as tolerated. Pelvic rest for 6 weeks.   Outpatient follow up:6 weeks Follow up Appt: Future Appointments Date Time Provider Department Center  06/18/2015 8:45 AM Lazaro Arms, MD [facility]   Follow up visit: No Follow-up on file.  Postpartum contraception: Progesterone only pills  Newborn Data: Live born female  Birth Weight: 5 lb 13.3 oz (2645 g) APGAR: 7, 9  Baby Feeding: Breast Disposition:home with  mother   06/17/2015 Olivia Canter, MD

## 2015-06-17 NOTE — Lactation Note (Signed)
This note was copied from the chart of Mindy Garen LahMelissa Elgin. Lactation Consultation Note  Baby has been supplementing with a syringe.  Changed supplementation method to a foley cup as her volumes are increasing and it will also help to mobilize her tongue.  Also observed a BF and showed mom how to do breast compressions. Encouraged tongue exercises and brief tummy time a couple times a day to help with upper body ROM. Post-pumping, hand expression and feeding expressed BM back to baby recommended.  Follow-up as outpatient on 06/21/2015.  Patient Name: Mindy Bright ZOXWR'UToday's Date: 06/17/2015 Reason for consult: Follow-up assessment;Infant weight loss;Infant < 6lbs   Maternal Data    Feeding Feeding Type: Breast Milk  LATCH Score/Interventions                      Lactation Tools Discussed/Used Tools: Feeding cup   Consult Status      Soyla DryerJoseph, Kaye Mitro 06/17/2015, 1:24 PM

## 2015-06-18 ENCOUNTER — Encounter: Payer: BLUE CROSS/BLUE SHIELD | Admitting: Obstetrics & Gynecology

## 2015-06-21 ENCOUNTER — Ambulatory Visit (HOSPITAL_COMMUNITY)
Admit: 2015-06-21 | Discharge: 2015-06-21 | Disposition: A | Discharge status: Home / Self Care | Payer: BLUE CROSS/BLUE SHIELD | Attending: Obstetrics & Gynecology | Admitting: Obstetrics & Gynecology

## 2015-06-21 NOTE — Lactation Note (Signed)
Lactation Consult for [facility] Selmer DominionMakenna Reese Chandra (DOB: [birth year])  Mother's reason for visit: f/u from inpatient stay Consult:  Initial Lactation Consultant:  Remigio Eisenmengerichey, Kodah Maret Hamilton  ________________________________________________________________________ BW: [id number] (5# 13.3oz) 12-20: 5# 5.5oz 12-21: 5# 6oz 12-23: 5# 8.9oz (4.8% below BW) ________________________________________________________________________  Mother's Name: Edwin DadaMelissa C Mohammad Type of delivery:  NSVD Breastfeeding Experience: primip Maternal Medical Conditions:  Anxiety Maternal Medications: Prilosec  ________________________________________________________________________  Breastfeeding History (Post Discharge)  Frequency of breastfeeding: q3h Duration of feeding: 15-20 min  Pumping  Type of pump:  Medela pump in style Frequency:  q3h Volume: 30-60 ml  Supplementing:  15-6830mL w/finger-feeding (via curved-tip syringe)  Infant Intake and Output Assessment  Voids: 5-7 in 24 hrs.  Color:  Clear yellow Stools: 5-7 in 24 hrs.  Color:  Green and Yellow  ________________________________________________________________________  Maternal Breast Assessment  Breast:  Full Nipple:  Erect  _______________________________________________________________________ Feeding Assessment/Evaluation  Initial feeding assessment:  Infant's oral assessment:  WNL  Attached assessment:  Deep  Lips flanged:  Yes.    Suck assessment:  Nutritive  Pre-feed weight: 2518 g   Post-feed weight: 2540 g  Amount transferred: 22 ml R breast 1450, cross-cradle, 16 minutes  Pre-feed weight: 2540  Post-feed weight: 2566 g  Amount transferred: 26 ml 1510, L breast, cross-cradle, 15 min  Total amount transferred: 48 ml  Shanon RosserMakenna is 286 days old and is 4.8% below BW (she weighed 5# 13.3 oz at birth). Compared to the scale at the peds office, she has gained almost 3 oz over the last 2 days.   The parents have done  a great job of caring for W. R. BerkleyMakenna. Since d/c from the hospital, Makenna's ability to nurse has improved. During the consult, she latched w/ease and fed from both breasts until satiated.   At home, parents have been supplementing EBM tid when she does not nurse well. Parents have been supplementing by finger-feeding and using a curved-tip syringe, but are interested in another mode of supplementing. Based on Mom's anatomy & milk supply, I think parents could introduce paced-bottle feeding, which was discussed.  A hand-out was provided that showed different flow rates of typical brands on the market.   Mom reports a superficial crack on her R nipple, which was difficult to visualize. She reports that it bled yesterday. Comfort Gels provided w/instructions for use. Specifics of an asymmetric latch also shown via [employer] website animation.   I think Shanon RosserMakenna and the parents are doing very well. Shanon RosserMakenna has a f/u peds appt in 1 week (06-28-15).   Glenetta HewKim Dinesha Twiggs, RN, IBCLC

## 2015-07-09 ENCOUNTER — Telehealth: Payer: Self-pay | Admitting: *Deleted

## 2015-07-09 NOTE — Telephone Encounter (Signed)
i called pt and answered questions  Ok to extend leave as requested, she is going to find out if a letter is ok or if needs more paperwork and get back to you

## 2015-07-09 NOTE — Telephone Encounter (Signed)
Pt states delivered vaginal on 06/15/2015 did have Epidural, now having pain at the "end of her tailbone." Is this normal?

## 2015-07-30 ENCOUNTER — Ambulatory Visit: Payer: BLUE CROSS/BLUE SHIELD | Admitting: Obstetrics and Gynecology

## 2015-08-02 ENCOUNTER — Ambulatory Visit (INDEPENDENT_AMBULATORY_CARE_PROVIDER_SITE_OTHER): Payer: BLUE CROSS/BLUE SHIELD | Admitting: Obstetrics and Gynecology

## 2015-08-02 ENCOUNTER — Encounter: Payer: Self-pay | Admitting: Obstetrics and Gynecology

## 2015-08-02 NOTE — Progress Notes (Signed)
Patient ID: Mindy Bright, female   DOB: 1980/12/10, 35 y.o.   MRN: [id number]  Subjective:  Mindy Bright is a 35 y.o. female who presents for a 7 weeks postpartum visit  Patient concerns: none. Pt states she is breast feeding. She denies any pain or discomfort. She does note one day of lower abdominal pain and very light vaginal bleeding 2 weeks ago that resolved on its own. She denies any bleeding since. Pt reports she is not sure which birth control method she wants to proceed with. She denies UI or SUI. She notes occasional dyspareunia.   Prenatal and intrapartum course notable for normal vaginal delivery    Patient is not sexually active.   The following portions of the patient's history were reviewed and updated as appropriate: allergies, current medications, past family history, past medical history, past surgical history and problem list.  Review of Systems    See Subjective, otherwise negative ROS.  Objective:  BP 100/60 mmHg  Ht  (1.575 m)  Wt 113 lb 8 oz (51.483 kg)  BMI 20.75 kg/m2  Breastfeeding? Yes  General:  alert, cooperative and no distress     Lungs: clear to auscultation bilaterally  Heart:  regular rate and rhythm, S1, S2 normal, no murmur  Abdomen: soft, non-tender; bowel sounds normal; no masses,  no organomegaly   Vulva:  normal  Vagina: normal vagina, well-healed laceration, tissues are thinned out and appropriate for postpartum state. Small amount of cervical secretions present.   Cervix:  normal  Corpus: normal size, contour, position, consistency, mobility, non-tender  Adnexa:  normal adnexa  Uterus: Retroverted, non-tender        Assessment:  1.  postpartum exam.  2. Contraception: unsure, condoms  3.  Excellent postpartum state  4. Discussed oral contraceptive options   Plan:  Advised pt to self examine prior to sexual activity Follow up prn      By signing my name below, I, Doreatha Martin, attest that this documentation has been  prepared under the direction and in the presence of Tilda Burrow, MD. Electronically Signed: Doreatha Martin, ED Scribe. 08/02/2015. 11:39 AM.  I personally performed the services described in this documentation, which was SCRIBED in my presence. The recorded information has been reviewed and considered accurate. It has been edited as necessary during review. Tilda Burrow, MD

## 2015-08-05 ENCOUNTER — Telehealth: Payer: Self-pay | Admitting: *Deleted

## 2015-08-05 NOTE — Telephone Encounter (Signed)
Pt c/o sore throat, drainage. Pt states thinks it is allergies. What is safe OTC medication for allergies while breastfeeding?

## 2015-08-05 NOTE — Telephone Encounter (Signed)
Pt informed per [name], CNM, Zyrtec or Claritin safe for allergies while breastfeeding.

## 2015-12-25 ENCOUNTER — Encounter: Payer: Self-pay | Admitting: Adult Health

## 2015-12-25 ENCOUNTER — Ambulatory Visit (INDEPENDENT_AMBULATORY_CARE_PROVIDER_SITE_OTHER): Payer: BLUE CROSS/BLUE SHIELD | Admitting: Adult Health

## 2015-12-25 VITALS — BP 100/50 | HR 68 | Ht 62.0 in | Wt 112.0 lb

## 2015-12-25 DIAGNOSIS — Z01419 Encounter for gynecological examination (general) (routine) without abnormal findings: Secondary | ICD-10-CM | POA: Diagnosis not present

## 2015-12-25 NOTE — Patient Instructions (Signed)
Push fluids Follow up in 1 year

## 2015-12-25 NOTE — Progress Notes (Signed)
Patient ID: Mindy Bright, female   DOB: 04-15-1981, 35 y.o.   MRN: [id number] History of Present Illness: Mindy Bright is a 35 year old white female, married in for a well woman gyn exam,she had a normal pap with negative HPV 11/21/14.She had vaginal delivery 06/15/15, baby girl [place].She is still breast feeding and has not had a period, and is using condoms.And she has no sex drive.   Current Medications, Allergies, Past Medical History, Past Surgical History, Family History and Social History were reviewed in [facility] electronic [facility].     Review of Systems: Patient denies any headaches, hearing loss, fatigue, blurred vision, shortness of breath, chest pain, abdominal pain, problems with bowel movements, urination, or intercourse. No joint pain or mood swings.See HPI for positives.    Physical Exam:BP 100/50 mmHg  Pulse 68  Ht 5\' 2"  (1.575 m)  Wt 112 lb (50.803 kg)  BMI 20.48 kg/m2  Breastfeeding? Yes General:  Well developed, well nourished, no acute distress Skin:  Warm and dry,tan Neck:  Midline trachea, normal thyroid, good ROM, no lymphadenopathy Lungs; Clear to auscultation bilaterally Breast:  No dominant palpable mass, retraction, or nipple discharge Cardiovascular: Regular rate and rhythm Abdomen:  Soft, non tender, no hepatosplenomegaly Pelvic:  External genitalia is normal in appearance, no lesions.  The vagina is normal in appearance. Urethra has no lesions or masses. The cervix is bulbous.  Uterus is felt to be normal size, shape, and contour.  No adnexal masses or tenderness noted.Bladder is non tender, no masses felt. Extremities/musculoskeletal:  No swelling or varicosities noted, no clubbing or cyanosis Psych:  No mood changes, alert and cooperative,seems happy Discussed not unusual not to have period when breastfeeding and also decreased libido, call when stops breast feeding if wants to start low dose OC.   Impression: Well woman gyn exam no  pap    Plan: Physical in 1 year, pap in 2019 Use condoms Push fluids Call if wants OCs when stopped breastfeeding

## 2016-02-06 ENCOUNTER — Emergency Department (HOSPITAL_COMMUNITY)
Admission: EM | Admit: 2016-02-06 | Discharge: 2016-02-06 | Disposition: A | Discharge status: Home / Self Care | Payer: BLUE CROSS/BLUE SHIELD | Attending: Emergency Medicine | Admitting: Emergency Medicine

## 2016-02-06 ENCOUNTER — Encounter (HOSPITAL_COMMUNITY): Payer: Self-pay | Admitting: Emergency Medicine

## 2016-02-06 DIAGNOSIS — Z79899 Other long term (current) drug therapy: Secondary | ICD-10-CM | POA: Diagnosis not present

## 2016-02-06 DIAGNOSIS — R1011 Right upper quadrant pain: Secondary | ICD-10-CM

## 2016-02-06 DIAGNOSIS — R110 Nausea: Secondary | ICD-10-CM | POA: Diagnosis not present

## 2016-02-06 DIAGNOSIS — R197 Diarrhea, unspecified: Secondary | ICD-10-CM | POA: Insufficient documentation

## 2016-02-06 DIAGNOSIS — Z87891 Personal history of nicotine dependence: Secondary | ICD-10-CM | POA: Diagnosis not present

## 2016-02-06 LAB — CBC WITH DIFFERENTIAL/PLATELET
Basophils Absolute: 0 10*3/uL (ref 0.0–0.1)
Basophils Relative: 0 %
EOS ABS: 0.3 10*3/uL (ref 0.0–0.7)
Eosinophils Relative: 3 %
HEMATOCRIT: 37.2 % (ref 36.0–46.0)
HEMOGLOBIN: 12.4 g/dL (ref 12.0–15.0)
LYMPHS ABS: 2.3 10*3/uL (ref 0.7–4.0)
LYMPHS PCT: 26 %
MCH: 28.5 pg (ref 26.0–34.0)
MCHC: 33.3 g/dL (ref 30.0–36.0)
MCV: 85.5 fL (ref 78.0–100.0)
MONOS PCT: 7 %
Monocytes Absolute: 0.6 10*3/uL (ref 0.1–1.0)
NEUTROS PCT: 64 %
Neutro Abs: 5.9 10*3/uL (ref 1.7–7.7)
Platelets: 348 10*3/uL (ref 150–400)
RBC: 4.35 MIL/uL (ref 3.87–5.11)
RDW: 13.1 % (ref 11.5–15.5)
WBC: 9.1 10*3/uL (ref 4.0–10.5)

## 2016-02-06 LAB — COMPREHENSIVE METABOLIC PANEL
ALBUMIN: 4.2 g/dL (ref 3.5–5.0)
ALK PHOS: 113 U/L (ref 38–126)
ALT: 19 U/L (ref 14–54)
ANION GAP: 8 (ref 5–15)
AST: 20 U/L (ref 15–41)
BUN: 15 mg/dL (ref 6–20)
CALCIUM: 8.6 mg/dL — AB (ref 8.9–10.3)
CHLORIDE: 104 mmol/L (ref 101–111)
CO2: 22 mmol/L (ref 22–32)
Creatinine, Ser: 0.92 mg/dL (ref 0.44–1.00)
GFR calc Af Amer: >60 mL/min (ref >60)
GFR calc non Af Amer: >60 mL/min (ref >60)
GLUCOSE: 91 mg/dL (ref 65–99)
Potassium: 3.7 mmol/L (ref 3.5–5.1)
SODIUM: 134 mmol/L — AB (ref 135–145)
Total Bilirubin: 1.3 mg/dL — ABNORMAL HIGH (ref 0.3–1.2)
Total Protein: 7.2 g/dL (ref 6.5–8.1)

## 2016-02-06 LAB — URINALYSIS, ROUTINE W REFLEX MICROSCOPIC
BILIRUBIN URINE: NEGATIVE
GLUCOSE, UA: NEGATIVE mg/dL
HGB URINE DIPSTICK: NEGATIVE
Ketones, ur: NEGATIVE mg/dL
Leukocytes, UA: NEGATIVE
Nitrite: NEGATIVE
PH: 5.5 (ref 5.0–8.0)
Protein, ur: NEGATIVE mg/dL

## 2016-02-06 LAB — LIPASE, BLOOD: Lipase: 30 U/L (ref 11–51)

## 2016-02-06 LAB — PREGNANCY, URINE: Preg Test, Ur: NEGATIVE

## 2016-02-06 NOTE — ED Triage Notes (Signed)
history GB problem, controlled with diet.  Last few month has been eating regular diet and pain has increased.

## 2016-02-06 NOTE — ED Provider Notes (Signed)
[facility]-EMERGENCY DEPT Provider Note   CSN: [id number] Arrival date & time: 02/06/16  1617  First Provider Contact:  First MD Initiated Contact with Patient 02/06/16 1638        History   Chief Complaint Chief Complaint  Patient presents with  . Abdominal Pain    HPI Mindy Bright is a 35 y.o. female.  HPI Patient presents with one week of diarrhea and right upper quadrant pain. Patient states the diarrhea has since resolved with the last few days but continues to have constant right upper quadrant discomfort. She denies any blood in her stool. She had nausea initially but currently does not. She's had no fever or chills. No sick contacts. Patient has a history of gallbladder disease. No urinary symptoms. Past Medical History:  Diagnosis Date  . Abnormal Pap smear   . Anxiety   . Gall bladder disease   . GERD (gastroesophageal reflux disease)   . H/O hiatal hernia   . Irregular periods 04/30/2014  . Patient desires pregnancy 12/11/2013  . Pregnant 10/26/2014  . Reflux 11/09/2014  . Unspecified symptom associated with female genital organs 12/11/2013  . Vaginal discharge 12/11/2013  . Vaginal Pap smear, abnormal    >10 years ago, no treatment    Patient Active Problem List   Diagnosis Date Noted  . Postpartum care and examination 08/02/2015  . Premature rupture of membranes (PROM) at term with onset of labor after 24 hours, antepartum 06/14/2015  . Reflux 11/09/2014  . Supervision of normal first pregnancy 10/26/2014  . Irregular periods 04/30/2014    Past Surgical History:  Procedure Laterality Date  . WISDOM TOOTH EXTRACTION      OB History    Gravida Para Term Preterm AB Living   SAB TAB Ectopic Multiple Live Births         0 1       Home Medications    Prior to Admission medications   Medication Sig Start Date End Date Taking? Authorizing Provider  ibuprofen (ADVIL,MOTRIN) 200 MG tablet Take 200 mg by mouth every 6 (six) hours as needed for  moderate pain.   Yes Historical Provider, MD  Omega-3 Fatty Acids (FISH OIL) 1000 MG CAPS Take 1 capsule by mouth daily.   Yes Historical Provider, MD  omeprazole (PRILOSEC) 40 MG capsule Take 1 capsule (40 mg total) by mouth daily. 03/28/15  Yes Lazaro Arms, MD  Prenatal Vit-Fe Fumarate-FA (PRENATAL VITAMIN PO) Take by mouth daily.   Yes Historical Provider, MD    Family History Family History  Problem Relation Age of Onset  . Diabetes Mother   . Heart disease Paternal Grandfather   . Diabetes Paternal Grandfather   . Diabetes Maternal Grandmother   . Hypertension Father   . Heart disease Maternal Grandfather   . Gout Brother     Social History Social History  Substance Use Topics  . Smoking status: Former Smoker    Packs/day: 0.25    Years: 3.00    Types: Cigarettes    Quit date: 07/14/2002  . Smokeless tobacco: Never Used  . Alcohol use No     Comment: not now     Allergies   Penicillins   Review of Systems Review of Systems  Constitutional: Negative for chills and fever.  Respiratory: Negative for shortness of breath.   Cardiovascular: Negative for chest pain.  Gastrointestinal: Positive for abdominal pain, diarrhea and nausea. Negative for blood in  stool, constipation and vomiting.  Genitourinary: Negative for dysuria, enuresis, flank pain, frequency, hematuria and pelvic pain.  Musculoskeletal: Negative for back pain, joint swelling, myalgias, neck pain and neck stiffness.  Skin: Negative for rash and wound.  Neurological: Negative for dizziness, weakness, light-headedness, numbness and headaches.  All other systems reviewed and are negative.    Physical Exam Updated Vital Signs BP 134/82 (BP Location: Left Arm)   Pulse 62   Temp 98.2 F (36.8 C) (Oral)   Resp 16   Ht 5\' 2"  (1.575 m)   Wt 110 lb (49.9 kg)   LMP 01/24/2016 (Exact Date)   SpO2 99%   BMI 20.12 kg/m   Physical Exam  Constitutional: She is oriented to person, place, and time. She  appears well-developed and well-nourished.  HENT:  Head: Normocephalic and atraumatic.  Mouth/Throat: Oropharynx is clear and moist.  Eyes: EOM are normal. Pupils are equal, round, and reactive to light.  Neck: Normal range of motion. Neck supple.  Cardiovascular: Normal rate and regular rhythm.   Pulmonary/Chest: Effort normal and breath sounds normal. No respiratory distress. She has no wheezes. She has no rales. She exhibits no tenderness.  Abdominal: Soft. Bowel sounds are normal. She exhibits no distension and no mass. There is no tenderness. There is no rebound and no guarding. No hernia.  Musculoskeletal: Normal range of motion. She exhibits no edema or tenderness.  No CVA tenderness bilaterally.  Neurological: She is alert and oriented to person, place, and time.  Skin: Skin is warm and dry. No rash noted. No erythema.  Psychiatric: She has a normal mood and affect. Her behavior is normal.  Nursing note and vitals reviewed.    ED Treatments / Results  Labs (all labs ordered are listed, but only abnormal results are displayed) Labs Reviewed  COMPREHENSIVE METABOLIC PANEL - Abnormal; Notable for the following:       Result Value   Sodium 134 (*)    Calcium 8.6 (*)    Total Bilirubin 1.3 (*)    All other components within normal limits  URINALYSIS, ROUTINE W REFLEX MICROSCOPIC (NOT AT [facility]) - Abnormal; Notable for the following:    Specific Gravity, Urine <1.005 (*)    All other components within normal limits  LIPASE, BLOOD  CBC WITH DIFFERENTIAL/PLATELET  PREGNANCY, URINE    EKG  EKG Interpretation None       Radiology No results found.  Procedures Procedures (including critical care time)  Medications Ordered in ED Medications - No data to display   Initial Impression / Assessment and Plan / ED Course  I have reviewed the triage vital signs and the nursing notes.  Pertinent labs & imaging results that were available during my care of the patient were  reviewed by me and considered in my medical decision making (see chart for details).  Clinical Course   Patient is very well-appearing. She has a benign abdominal exam. She is afebrile with a normal white blood cell count. Do not believe that further imaging is necessary. She is advised to follow-up with her primary physician should she continue to have diarrhea. May need a stool sample at that point. Return precautions have been given.   Final Clinical Impressions(s) / ED Diagnoses   Final diagnoses:  Right upper quadrant pain  Diarrhea, unspecified type    New Prescriptions Discharge Medication List as of 02/06/2016  7:03 PM       Loren Raceravid Madoc Holquin, MD 02/07/16 1547

## 2016-03-11 ENCOUNTER — Encounter: Payer: Self-pay | Admitting: Internal Medicine

## 2016-03-30 ENCOUNTER — Ambulatory Visit: Payer: BLUE CROSS/BLUE SHIELD | Admitting: Internal Medicine

## 2016-06-01 ENCOUNTER — Other Ambulatory Visit (INDEPENDENT_AMBULATORY_CARE_PROVIDER_SITE_OTHER): Payer: BLUE CROSS/BLUE SHIELD

## 2016-06-01 ENCOUNTER — Encounter: Payer: Self-pay | Admitting: Internal Medicine

## 2016-06-01 ENCOUNTER — Ambulatory Visit (INDEPENDENT_AMBULATORY_CARE_PROVIDER_SITE_OTHER): Payer: BLUE CROSS/BLUE SHIELD | Admitting: Internal Medicine

## 2016-06-01 VITALS — BP 112/78 | HR 84 | Ht 62.0 in | Wt 103.0 lb

## 2016-06-01 DIAGNOSIS — R1011 Right upper quadrant pain: Secondary | ICD-10-CM

## 2016-06-01 DIAGNOSIS — R748 Abnormal levels of other serum enzymes: Secondary | ICD-10-CM

## 2016-06-01 DIAGNOSIS — K219 Gastro-esophageal reflux disease without esophagitis: Secondary | ICD-10-CM

## 2016-06-01 LAB — GAMMA GT: GGT: 8 U/L (ref 7–51)

## 2016-06-01 NOTE — Progress Notes (Signed)
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 Patient reports a 10 year history of intermittent problems with right upper quadrant pain. She describes "gallbladder attacks" as sharp epigastric pain with radiation into the right side and back. Clear occurs at night and lasts for several hours. Helped with baking soda. Abdominal ultrasounds have failed to demonstrate gallstones. However, she tells me that a HIDA scan revealed low ejection fraction of 28%. Outside records have been requested. Previous care in [place] patient was [facility] room in August for abdominal pain and diarrhea. She states this is different than gallbladder attacks. Workup was unremarkable. Felt to have a gastroenteritis. Unremarkable ultrasound 2016. CT scan 2010 unremarkable for right upper quadrant pain. She tells me that she has had prior upper endoscopy. Apparently revealing no significant abnormalities. She does have GERD for which she takes PPI with control of symptoms. She does report some weight loss. She is 1 year postpartum. Continues to breast-feed. Blood work reveals mildly elevated alkaline phosphatase. 156 with the upper limit of normal 117.Marland Kitchen. Otherwise negative. Episodes of pain can occur a few times per year. No episodes of pain in several months. It was suggested that she see a surgeon and undergo cholecystectomy. She has been apprehensive and request another opinion. She also has intermittent dull aching discomfort in the right upper quadrant which can last a day or 2 then resolved. Bowel habits are regular.  REVIEW OF SYSTEMS:  All non-GI ROS negative except for sinus and allergy trouble  Past Medical History:  Diagnosis Date  . Abnormal Pap smear   . Anemia   . Anxiety    . Gall bladder disease   . GERD (gastroesophageal reflux disease)   . H/O hiatal hernia   . Irregular periods 04/30/2014  . Patient desires pregnancy 12/11/2013  . Pregnant 10/26/2014  . Reflux 11/09/2014  . Unspecified symptom associated with female genital organs 12/11/2013  . Vaginal discharge 12/11/2013  . Vaginal Pap smear, abnormal    >10 years ago, no treatment    Past Surgical History:  Procedure Laterality Date  . WISDOM TOOTH EXTRACTION      Social History Mindy Bright  reports that she quit smoking about 13 years ago. Her smoking use included Cigarettes. She has a 0.75 pack-year smoking history. She has never used smokeless tobacco. She reports that she does not drink alcohol or use drugs.  family history includes Diabetes in her maternal grandmother, mother, and paternal grandfather; Gout in her brother; Heart disease in her maternal grandfather and paternal grandfather; Hypertension in her father.  Allergies  Allergen Reactions  . Penicillins Swelling    Has patient had a PCN reaction causing immediate rash, facial/tongue/throat swelling, SOB or lightheadedness with hypotension: Yes Has patient had a PCN reaction causing severe rash involving mucus membranes or skin necrosis: No Has patient had a PCN reaction that required hospitalization Yes Has patient had a PCN reaction occurring within the last 10 years: No If all of the above answers are "NO", then may proceed with Cephalosporin use.       PHYSICAL EXAMINATION: Vital signs: BP 112/78   Pulse 84   Ht 5\' 2"  (1.575 m)   Wt 103 lb (46.7 kg)   BMI 18.84 kg/m   Constitutional: generally well-appearing, no  acute distress Psychiatric: alert and oriented x3, cooperative Eyes: extraocular movements intact, anicteric, conjunctiva pink Mouth: oral pharynx moist, no lesions Neck: supple no lymphadenopathy Cardiovascular: heart regular rate and rhythm, no murmur Lungs: clear to auscultation bilaterally Abdomen:  soft, nontender, nondistended, no obvious ascites, no peritoneal signs, normal bowel sounds, no organomegaly Rectal: Omitted Extremities: no clubbing cyanosis or lower extremity edema bilaterally Skin: no lesions on visible extremities Neuro: No focal deficits. No asterixis. Cranial nerves intact  ASSESSMENT:  #1. Intermittent right upper quadrant pain. Question gallbladder dysfunction. No recent problems #2. GERD. Controlled with PPI #3. Elevated alkaline phosphatase #4. Weight loss  PLAN:  #1. Outside records requested for review #2. Reflux precautions #3. Lowest dose of PPI to control reflux symptoms #4. GGT and AMA to evaluate alkaline phosphatase elevation #5. Routine office follow-up in a few months. Contact the office in the interim for any interval problems  A copy of this consultation note has been sent to Dr[facility] Luciana [facility]in at [facility]

## 2016-06-01 NOTE — Patient Instructions (Signed)
Your physician has requested that you go to the basement for the following lab work before leaving today: TTG, AMA  I will request GI records from Dr. Aleene DavidsonSpainhour.    Please follow up after the new year

## 2016-06-02 ENCOUNTER — Telehealth: Payer: Self-pay | Admitting: Internal Medicine

## 2016-06-02 NOTE — Telephone Encounter (Signed)
No

## 2016-06-02 NOTE — Telephone Encounter (Signed)
Pt has alk phos drawn and per Dr. Henrene Pastor the elevation is not liver related. Pt wants to know if it could be related to her gallbladder. Please advise.

## 2016-06-02 NOTE — Telephone Encounter (Signed)
Pt aware.

## 2016-06-03 LAB — MITOCHONDRIAL ANTIBODIES

## 2016-08-03 ENCOUNTER — Ambulatory Visit: Payer: BLUE CROSS/BLUE SHIELD | Admitting: Internal Medicine

## 2016-08-13 ENCOUNTER — Other Ambulatory Visit: Payer: Self-pay | Admitting: Physician Assistant

## 2016-08-31 ENCOUNTER — Ambulatory Visit: Payer: BLUE CROSS/BLUE SHIELD | Admitting: Internal Medicine

## 2016-09-04 ENCOUNTER — Other Ambulatory Visit (INDEPENDENT_AMBULATORY_CARE_PROVIDER_SITE_OTHER): Payer: BLUE CROSS/BLUE SHIELD

## 2016-09-04 ENCOUNTER — Ambulatory Visit (INDEPENDENT_AMBULATORY_CARE_PROVIDER_SITE_OTHER): Payer: BLUE CROSS/BLUE SHIELD | Admitting: Internal Medicine

## 2016-09-04 ENCOUNTER — Encounter: Payer: Self-pay | Admitting: Internal Medicine

## 2016-09-04 VITALS — BP 128/80 | HR 77 | Temp 98.5°F | Resp 16 | Ht 62.0 in | Wt 106.0 lb

## 2016-09-04 DIAGNOSIS — E010 Iodine-deficiency related diffuse (endemic) goiter: Secondary | ICD-10-CM | POA: Diagnosis not present

## 2016-09-04 DIAGNOSIS — K219 Gastro-esophageal reflux disease without esophagitis: Secondary | ICD-10-CM | POA: Diagnosis not present

## 2016-09-04 DIAGNOSIS — R7989 Other specified abnormal findings of blood chemistry: Secondary | ICD-10-CM

## 2016-09-04 DIAGNOSIS — R946 Abnormal results of thyroid function studies: Secondary | ICD-10-CM

## 2016-09-04 LAB — T4, FREE: FREE T4: 0.97 ng/dL (ref 0.60–1.60)

## 2016-09-04 LAB — T3, FREE: T3, Free: 3.5 pg/mL (ref 2.3–4.2)

## 2016-09-04 LAB — TSH: TSH: 5.77 u[IU]/mL — ABNORMAL HIGH (ref 0.35–4.50)

## 2016-09-04 NOTE — Patient Instructions (Signed)
Blood work and a thyroid ultrasound was ordered.  Test(s) ordered today. Your results will be released to MyChart (or called to you) after review, usually within 72hours after test completion. If any changes need to be made, you will be notified at that same time.   Medications reviewed and updated.  No changes recommended at this time.    Please followup as needed

## 2016-09-04 NOTE — Assessment & Plan Note (Signed)
?   Mild thyroid enlargement on right Will check [place]

## 2016-09-04 NOTE — Assessment & Plan Note (Addendum)
tsh last year abnormal (done by prior pcp) - it was check a few months ago giving birth to her first child Currently asymptomatic Check tft's and thyroid Ab Check US She is thinking about getting pregnant this year

## 2016-09-04 NOTE — Progress Notes (Signed)
Subjective:    Patient ID: Mindy Bright, female    DOB: 11/27/1980, 36 y.o.   MRN: [id number]  HPI She is here to establish with a new pcp.    She had an elevated alk phos and tsh by her last pcp. She was put on medication, but never started it.  She wondered if she really needs it.  She denies any concerning symptoms.   She is a Psychologist, counselling and coaches softball.    Medications and allergies reviewed with patient and updated if appropriate.  Patient Active Problem List   Diagnosis Date Noted  . Abnormal TSH 09/04/2016  . Thyromegaly 09/04/2016  . GERD (gastroesophageal reflux disease) 11/09/2014    Current Outpatient Prescriptions on File Prior to Visit  Medication Sig Dispense Refill  . omeprazole (PRILOSEC) 40 MG capsule Take 1 capsule (40 mg total) by mouth daily. 30 capsule 11   No current facility-administered medications on file prior to visit.     Past Medical History:  Diagnosis Date  . Abnormal Pap smear   . Anemia   . Anxiety   . Gall bladder disease   . GERD (gastroesophageal reflux disease)   . H/O hiatal hernia   . Irregular periods 04/30/2014  . Patient desires pregnancy 12/11/2013  . Pregnant 10/26/2014  . Reflux 11/09/2014  . Unspecified symptom associated with female genital organs 12/11/2013  . Vaginal discharge 12/11/2013  . Vaginal Pap smear, abnormal    >10 years ago, no treatment    Past Surgical History:  Procedure Laterality Date  . WISDOM TOOTH EXTRACTION      Social History   Social History  . Marital status: Married    Spouse name: N/A  . Number of children: N/A  . Years of education: N/A   Social History Main Topics  . Smoking status: Former Smoker    Packs/day: 0.25    Years: 3.00    Types: Cigarettes    Quit date: 07/14/2002  . Smokeless tobacco: Never Used  . Alcohol use No     Comment: not now  . Drug use: No  . Sexual activity: Yes    Birth control/ protection: None, Condom   Other Topics Concern  . None     Social History Narrative  . None    Family History  Problem Relation Age of Onset  . Diabetes Mother   . Heart disease Paternal Grandfather   . Diabetes Paternal Grandfather   . Diabetes Maternal Grandmother   . Hypertension Father   . Heart disease Maternal Grandfather   . Gout Brother   . Colon cancer Neg Hx   . Stomach cancer Neg Hx   . Pancreatic cancer Neg Hx   . Esophageal cancer Neg Hx   . Liver disease Neg Hx     Review of Systems  Constitutional: Negative for appetite change, chills, fatigue, fever and unexpected weight change.  Eyes: Negative for visual disturbance.  Respiratory: Negative for cough, shortness of breath and wheezing.   Cardiovascular: Negative for chest pain, palpitations and leg swelling.  Gastrointestinal: Negative for abdominal pain, constipation and diarrhea.       No GERD - controlled  Genitourinary: Negative for dysuria and hematuria.       Menses regular  Musculoskeletal: Negative for arthralgias and back pain.  Skin:       No dry skin or hair loss  Neurological: Negative for dizziness, light-headedness and headaches.  Psychiatric/Behavioral: Negative for dysphoric mood. The patient  is not nervous/anxious.        Objective:   Vitals:   09/04/16 1508  BP: 128/80  Pulse: 77  Resp: 16  Temp: 98.5 F (36.9 C)   Filed Weights   09/04/16 1508  Weight: 106 lb (48.1 kg)   Body mass index is 19.39 kg/m.  Wt Readings from Last 3 Encounters:  09/04/16 106 lb (48.1 kg)  06/01/16 103 lb (46.7 kg)  02/06/16 110 lb (49.9 kg)     Physical Exam Constitutional: She appears well-developed and well-nourished. No distress.  HENT:  Head: Normocephalic and atraumatic.  Right Ear: External ear normal. Normal ear canal and TM Left Ear: External ear normal.  Normal ear canal and TM Mouth/Throat: Oropharynx is clear and moist.  Eyes: Conjunctivae and EOM are normal.  Neck: Neck supple. No tracheal deviation present. ? Right sided  thyromegaly present.  No carotid bruit  Cardiovascular: Normal rate, regular rhythm and normal heart sounds.   No murmur heard.  No edema. Pulmonary/Chest: Effort normal and breath sounds normal. No respiratory distress. She has no wheezes. She has no rales.  Abdominal: Soft. She exhibits no distension. There is no tenderness.  Lymphadenopathy: She has no cervical adenopathy.  Skin: Skin is warm and dry. She is not diaphoretic.  Psychiatric: She has a normal mood and affect. Her behavior is normal.         Assessment & Plan:   See Problem List for Assessment and Plan of chronic medical problems.

## 2016-09-04 NOTE — Assessment & Plan Note (Signed)
GERD controlled Continue daily medication  

## 2016-09-04 NOTE — Progress Notes (Signed)
Pre visit review using our clinic review tool, if applicable. No additional management support is needed unless otherwise documented below in the visit note. 

## 2016-09-06 ENCOUNTER — Other Ambulatory Visit: Payer: Self-pay | Admitting: Internal Medicine

## 2016-09-06 DIAGNOSIS — E039 Hypothyroidism, unspecified: Secondary | ICD-10-CM | POA: Insufficient documentation

## 2016-09-07 ENCOUNTER — Other Ambulatory Visit: Payer: Self-pay | Admitting: Emergency Medicine

## 2016-09-07 MED ORDER — LEVOTHYROXINE SODIUM 25 MCG PO TABS: 25.0000 ug | ORAL_TABLET | Freq: Every day | ORAL | 1 refills | Status: DC

## 2016-09-07 NOTE — Progress Notes (Unsigned)
RX sent per MDs request.

## 2016-09-25 ENCOUNTER — Ambulatory Visit
Admission: RE | Admit: 2016-09-25 | Discharge: 2016-09-25 | Disposition: A | Discharge status: Home / Self Care | Payer: BLUE CROSS/BLUE SHIELD | Source: Ambulatory Visit | Attending: Internal Medicine | Admitting: Internal Medicine

## 2016-09-25 DIAGNOSIS — E010 Iodine-deficiency related diffuse (endemic) goiter: Secondary | ICD-10-CM

## 2016-09-28 ENCOUNTER — Other Ambulatory Visit: Payer: BLUE CROSS/BLUE SHIELD

## 2016-12-22 ENCOUNTER — Encounter: Payer: BLUE CROSS/BLUE SHIELD | Admitting: Internal Medicine

## 2016-12-25 ENCOUNTER — Ambulatory Visit (INDEPENDENT_AMBULATORY_CARE_PROVIDER_SITE_OTHER): Payer: BLUE CROSS/BLUE SHIELD | Admitting: Adult Health

## 2016-12-25 ENCOUNTER — Encounter: Payer: Self-pay | Admitting: Adult Health

## 2016-12-25 ENCOUNTER — Other Ambulatory Visit (HOSPITAL_COMMUNITY)
Admission: RE | Admit: 2016-12-25 | Discharge: 2016-12-25 | Disposition: A | Discharge status: Home / Self Care | Payer: BLUE CROSS/BLUE SHIELD | Source: Ambulatory Visit | Attending: Adult Health | Admitting: Adult Health

## 2016-12-25 VITALS — BP 80/50 | HR 76 | Ht 62.0 in | Wt 111.0 lb

## 2016-12-25 DIAGNOSIS — Z01419 Encounter for gynecological examination (general) (routine) without abnormal findings: Secondary | ICD-10-CM | POA: Diagnosis not present

## 2016-12-25 DIAGNOSIS — Z8742 Personal history of other diseases of the female genital tract: Secondary | ICD-10-CM | POA: Insufficient documentation

## 2016-12-25 NOTE — Progress Notes (Signed)
Patient ID: Mindy Bright, female   DOB: August 07, 1980, 36 y.o.   MRN: [id number] History of Present Illness: Mindy KaufmannMelissa is a 36 year old white female, married,G1P1 in for a well woman gyn exam and pap. PCP is Dr [facility]ance [facility]Burns at [facility].    Current Medications, Allergies, Past Medical History, Past Surgical History, Family History and Social History were reviewed in [facility] electronic medical record.     Review of Systems:  Patient denies any headaches, hearing loss, fatigue, blurred vision, shortness of breath, chest pain, abdominal pain, problems with bowel movements, urination, or intercourse. No joint pain or mood swings.She is thinking about getting pregnant in future, but just just diagnosed with hypothyroidism and is wanting to get meds right first.    Physical Exam:BP (!) 80/50 (BP Location: Left Arm, Patient Position: Sitting, Cuff Size: Small)   Pulse 76   Ht 5\' 2"  (1.575 m)   Wt 111 lb (50.3 kg)   LMP 12/06/2016   BMI 20.30 kg/m  General:  Well developed, well nourished, no acute distress Skin:  Warm and dry Neck:  Midline trachea, normal thyroid, good ROM, no lymphadenopathy Lungs; Clear to auscultation bilaterally Breast:  No dominant palpable mass, retraction, or nipple discharge Cardiovascular: Regular rate and rhythm Abdomen:  Soft, non tender, no hepatosplenomegaly Pelvic:  External genitalia is normal in appearance, no lesions.  The vagina is normal in appearance. Urethra has no lesions or masses. The cervix is bulbous.Pap with HPV performed.  Uterus is felt to be normal size, shape, and contour.  No adnexal masses or tenderness noted.Bladder is non tender, no masses felt. Extremities/musculoskeletal:  No swelling or varicosities noted, no clubbing or cyanosis Psych:  No mood changes, alert and cooperative,seems happy PHQ 2 score 0.   Impression: 1. Encounter for gynecological examination with Papanicolaou smear of cervix   2. History of abnormal cervical Pap smear        Plan: Physical in 1 year Pap in 3 if normal Mammogram at 40 Take PNV with folic acid

## 2016-12-25 NOTE — Addendum Note (Signed)
Addended by: Federico FlakeNES, Gregorio Worley A on: 12/25/2016 10:35 AM   Modules accepted: Orders

## 2016-12-29 ENCOUNTER — Ambulatory Visit (INDEPENDENT_AMBULATORY_CARE_PROVIDER_SITE_OTHER): Payer: BLUE CROSS/BLUE SHIELD | Admitting: Internal Medicine

## 2016-12-29 ENCOUNTER — Encounter: Payer: Self-pay | Admitting: Internal Medicine

## 2016-12-29 VITALS — BP 98/72 | HR 80 | Temp 98.6°F | Resp 16 | Ht 62.0 in | Wt 110.0 lb

## 2016-12-29 DIAGNOSIS — Z0001 Encounter for general adult medical examination with abnormal findings: Secondary | ICD-10-CM | POA: Diagnosis not present

## 2016-12-29 DIAGNOSIS — R202 Paresthesia of skin: Secondary | ICD-10-CM

## 2016-12-29 DIAGNOSIS — K219 Gastro-esophageal reflux disease without esophagitis: Secondary | ICD-10-CM

## 2016-12-29 DIAGNOSIS — E039 Hypothyroidism, unspecified: Secondary | ICD-10-CM | POA: Diagnosis not present

## 2016-12-29 DIAGNOSIS — K8020 Calculus of gallbladder without cholecystitis without obstruction: Secondary | ICD-10-CM | POA: Insufficient documentation

## 2016-12-29 DIAGNOSIS — Z0000 Encounter for general adult medical examination without abnormal findings: Secondary | ICD-10-CM

## 2016-12-29 LAB — CYTOLOGY - PAP
DIAGNOSIS: NEGATIVE
HPV: NOT DETECTED

## 2016-12-29 NOTE — Progress Notes (Addendum)
Subjective:    Patient ID: Mindy Bright, female    DOB: [birth year], 36 y.o.   MRN: [id number]  HPI She is here for a physical exam.   She has occasional random tingling or pinching throughout her body. It feels like a bug bite, but nothing is there. It started about three weeks ago.    She just started taking her thyroid medication two weeks ago.  She does have some faituge but typically it is only in the afternoon.    Medications and allergies reviewed with patient and updated if appropriate.  Patient Active Problem List   Diagnosis Date Noted  . History of abnormal cervical Pap smear 12/25/2016  . Hypothyroidism 09/06/2016  . GERD (gastroesophageal reflux disease) 11/09/2014    Current Outpatient Prescriptions on File Prior to Visit  Medication Sig Dispense Refill  . levothyroxine (SYNTHROID, LEVOTHROID) 25 MCG tablet Take 1 tablet (25 mcg total) by mouth daily before breakfast. 30 tablet 1  . omeprazole (PRILOSEC) 40 MG capsule Take 1 capsule (40 mg total) by mouth daily. 30 capsule 11   No current facility-administered medications on file prior to visit.     Past Medical History:  Diagnosis Date  . Abnormal Pap smear   . Anemia   . Anxiety   . Gall bladder disease   . GERD (gastroesophageal reflux disease)   . H/O hiatal hernia   . Irregular periods 04/30/2014  . Patient desires pregnancy 12/11/2013  . Pregnant 10/26/2014  . Reflux 11/09/2014  . Thyroid disease   . Unspecified symptom associated with female genital organs 12/11/2013  . Vaginal discharge 12/11/2013  . Vaginal Pap smear, abnormal    >10 years ago, no treatment    Past Surgical History:  Procedure Laterality Date  . WISDOM TOOTH EXTRACTION      Social History   Social History  . Marital status: Married    Spouse name: N/A  . Number of children: N/A  . Years of education: N/A   Social History Main Topics  . Smoking status: Former Smoker    Packs/day: 0.25    Years: 3.00    Types:  Cigarettes    Quit date: 07/14/2002  . Smokeless tobacco: Never Used  . Alcohol use No     Comment: not now  . Drug use: No  . Sexual activity: Yes    Birth control/ protection: None, Condom   Other Topics Concern  . None   Social History Narrative  . None    Family History  Problem Relation Age of Onset  . Diabetes Mother   . Heart disease Paternal Grandfather   . Diabetes Paternal Grandfather   . Diabetes Maternal Grandmother   . Hypertension Father   . Heart disease Maternal Grandfather   . Gout Brother   . Colon cancer Neg Hx   . Stomach cancer Neg Hx   . Pancreatic cancer Neg Hx   . Esophageal cancer Neg Hx   . Liver disease Neg Hx     Review of Systems  Constitutional: Positive for fatigue (in afternoon). Negative for chills, fever and unexpected weight change.  Eyes: Negative for visual disturbance.  Respiratory: Negative for cough, shortness of breath and wheezing.   Cardiovascular: Negative for chest pain (had chest pain two weeks ago - evaluated at urgent care), palpitations and leg swelling.  Gastrointestinal: Negative for abdominal pain, blood in stool, constipation, diarrhea and nausea.  Genitourinary: Negative for dysuria and hematuria.  Musculoskeletal: Negative for arthralgias  and back pain.  Skin: Negative for color change and rash.       Skin on dry side  Neurological: Negative for light-headedness and headaches.  Psychiatric/Behavioral: Negative for dysphoric mood and sleep disturbance. The patient is not nervous/anxious.        Objective:   Vitals:   12/29/16 1514  BP: 98/72  Pulse: 80  Resp: 16  Temp: 98.6 F (37 C)   Filed Weights   12/29/16 1514  Weight: 110 lb (49.9 kg)   Body mass index is 20.12 kg/m.  Wt Readings from Last 3 Encounters:  12/29/16 110 lb (49.9 kg)  12/25/16 111 lb (50.3 kg)  09/04/16 106 lb (48.1 kg)     Physical Exam Constitutional: She appears well-developed and well-nourished. No distress.  HENT:    Head: Normocephalic and atraumatic.  Right Ear: External ear normal. Normal ear canal and TM Left Ear: External ear normal.  Normal ear canal and TM Mouth/Throat: Oropharynx is clear and moist.  Eyes: Conjunctivae and EOM are normal.  Neck: Neck supple. No tracheal deviation present. No thyromegaly present.  No carotid bruit  Cardiovascular: Normal rate, regular rhythm and normal heart sounds.   No murmur heard.  No edema. Pulmonary/Chest: Effort normal and breath sounds normal. No respiratory distress. She has no wheezes. She has no rales.  Breast: deferred to Gyn Abdominal: Soft. She exhibits no distension. There is no tenderness.  Lymphadenopathy: She has no cervical adenopathy.  Skin: Skin is warm and dry. She is not diaphoretic.  Psychiatric: She has a normal mood and affect. Her behavior is normal.         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  FU annually

## 2016-12-29 NOTE — Assessment & Plan Note (Signed)
Intermittent, transient Will check B12

## 2016-12-29 NOTE — Patient Instructions (Addendum)
Test(s) ordered today. Your results will be released to [place] (or called to you) after review, usually within 72hours after test completion. If any changes need to be made, you will be notified at that same time.  All other Health Maintenance issues reviewed.   All recommended immunizations and age-appropriate screenings are up-to-date or discussed.  No immunizations administered today.   Medications reviewed and updated. No changes recommended at this time.  Please followup in one year for physical    Health Maintenance, Female Adopting a healthy lifestyle and getting preventive care can go a long way to promote health and wellness. Talk with your health care provider about what schedule of regular examinations is right for you. This is a good chance for you to check in with your provider about disease prevention and staying healthy. In between checkups, there are plenty of things you can do on your own. Experts have done a lot of research about which lifestyle changes and preventive measures are most likely to keep you healthy. Ask your health care provider for more information. Weight and diet Eat a healthy diet  Be sure to include plenty of vegetables, fruits, low-fat dairy products, and lean protein.  Do not eat a lot of foods high in solid fats, added sugars, or salt.  Get regular exercise. This is one of the most important things you can do for your health. ? Most adults should exercise for at least 150 minutes each week. The exercise should increase your heart rate and make you sweat (moderate-intensity exercise). ? Most adults should also do strengthening exercises at least twice a week. This is in addition to the moderate-intensity exercise.  Maintain a healthy weight  Body mass index (BMI) is a measurement that can be used to identify possible weight problems. It estimates body fat based on height and weight. Your health care provider can help determine your BMI and help you  achieve or maintain a healthy weight.  For females 18 years of age and older: ? A BMI below 18.5 is considered underweight. ? A BMI of 18.5 to 24.9 is normal. ? A BMI of 25 to 29.9 is considered overweight. ? A BMI of 30 and above is considered obese.  Watch levels of cholesterol and blood lipids  You should start having your blood tested for lipids and cholesterol at 36 years of age, then have this test every 5 years.  You may need to have your cholesterol levels checked more often if: ? Your lipid or cholesterol levels are high. ? You are older than 36 years of age. ? You are at high risk for heart disease.  Cancer screening Lung Cancer  Lung cancer screening is recommended for adults 23-88 years old who are at high risk for lung cancer because of a history of smoking.  A yearly low-dose CT scan of the lungs is recommended for people who: ? Currently smoke. ? Have quit within the past 15 years. ? Have at least a 30-pack-year history of smoking. A pack year is smoking an average of one pack of cigarettes a day for 1 year.  Yearly screening should continue until it has been 15 years since you quit.  Yearly screening should stop if you develop a health problem that would prevent you from having lung cancer treatment.  Breast Cancer  Practice breast self-awareness. This means understanding how your breasts normally appear and feel.  It also means doing regular breast self-exams. Let your health care provider know about any  no matter how small.  If you are in your 20s or 30s, you should have a clinical breast exam (CBE) by a health care provider every 1-3 years as part of a regular health exam.  If you are 40 or older, have a CBE every year. Also consider having a breast X-ray (mammogram) every year.  If you have a family history of breast cancer, talk to your health care provider about genetic screening.  If you are at high risk for breast cancer, talk to your health  care provider about having an MRI and a mammogram every year.  Breast cancer gene (BRCA) assessment is recommended for women who have family members with BRCA-related cancers. BRCA-related cancers include: ? Breast. ? Ovarian. ? Tubal. ? Peritoneal cancers.  Results of the assessment will determine the need for genetic counseling and BRCA1 and BRCA2 testing.  Cervical Cancer Your health care provider may recommend that you be screened regularly for cancer of the pelvic organs (ovaries, uterus, and vagina). This screening involves a pelvic examination, including checking for microscopic changes to the surface of your cervix (Pap test). You may be encouraged to have this screening done every 3 years, beginning at age 21.  For women ages 30-65, health care providers may recommend pelvic exams and Pap testing every 3 years, or they may recommend the Pap and pelvic exam, combined with testing for human papilloma virus (HPV), every 5 years. Some types of HPV increase your risk of cervical cancer. Testing for HPV may also be done on women of any age with unclear Pap test results.  Other health care providers may not recommend any screening for nonpregnant women who are considered low risk for pelvic cancer and who do not have symptoms. Ask your health care provider if a screening pelvic exam is right for you.  If you have had past treatment for cervical cancer or a condition that could lead to cancer, you need Pap tests and screening for cancer for at least 20 years after your treatment. If Pap tests have been discontinued, your risk factors (such as having a new sexual partner) need to be reassessed to determine if screening should resume. Some women have medical problems that increase the chance of getting cervical cancer. In these cases, your health care provider may recommend more frequent screening and Pap tests.  Colorectal Cancer  This type of cancer can be detected and often  prevented.  Routine colorectal cancer screening usually begins at 36 years of age and continues through 36 years of age.  Your health care provider may recommend screening at an earlier age if you have risk factors for colon cancer.  Your health care provider may also recommend using home test kits to check for hidden blood in the stool.  A small camera at the end of a tube can be used to examine your colon directly (sigmoidoscopy or colonoscopy). This is done to check for the earliest forms of colorectal cancer.  Routine screening usually begins at age 50.  Direct examination of the colon should be repeated every 5-10 years through 36 years of age. However, you may need to be screened more often if early forms of precancerous polyps or small growths are found.  Skin Cancer  Check your skin from head to toe regularly.  Tell your health care provider about any new moles or changes in moles, especially if there is a change in a mole's shape or color.  Also tell your health care provider if you   have a mole that is larger than the size of a pencil eraser.  Always use sunscreen. Apply sunscreen liberally and repeatedly throughout the day.  Protect yourself by wearing long sleeves, pants, a wide-brimmed hat, and sunglasses whenever you are outside.  Heart disease, diabetes, and high blood pressure  High blood pressure causes heart disease and increases the risk of stroke. High blood pressure is more likely to develop in: ? People who have blood pressure in the high end of the normal range (130-139/85-89 mm Hg). ? People who are overweight or obese. ? People who are African American.  If you are 18-39 years of age, have your blood pressure checked every 3-5 years. If you are 40 years of age or older, have your blood pressure checked every year. You should have your blood pressure measured twice-once when you are at a hospital or clinic, and once when you are not at a hospital or clinic.  Record the average of the two measurements. To check your blood pressure when you are not at a hospital or clinic, you can use: ? An automated blood pressure machine at a pharmacy. ? A home blood pressure monitor.  If you are between 55 years and 79 years old, ask your health care provider if you should take aspirin to prevent strokes.  Have regular diabetes screenings. This involves taking a blood sample to check your fasting blood sugar level. ? If you are at a normal weight and have a low risk for diabetes, have this test once every three years after 36 years of age. ? If you are overweight and have a high risk for diabetes, consider being tested at a younger age or more often. Preventing infection Hepatitis B  If you have a higher risk for hepatitis B, you should be screened for this virus. You are considered at high risk for hepatitis B if: ? You were born in a country where hepatitis B is common. Ask your health care provider which countries are considered high risk. ? Your parents were born in a high-risk country, and you have not been immunized against hepatitis B (hepatitis B vaccine). ? You have HIV or AIDS. ? You use needles to inject street drugs. ? You live with someone who has hepatitis B. ? You have had sex with someone who has hepatitis B. ? You get hemodialysis treatment. ? You take certain medicines for conditions, including cancer, organ transplantation, and autoimmune conditions.  Hepatitis C  Blood testing is recommended for: ? Everyone born from 1945 through 1965. ? Anyone with known risk factors for hepatitis C.  Sexually transmitted infections (STIs)  You should be screened for sexually transmitted infections (STIs) including gonorrhea and chlamydia if: ? You are sexually active and are younger than 36 years of age. ? You are older than 36 years of age and your health care provider tells you that you are at risk for this type of infection. ? Your sexual  activity has changed since you were last screened and you are at an increased risk for chlamydia or gonorrhea. Ask your health care provider if you are at risk.  If you do not have HIV, but are at risk, it may be recommended that you take a prescription medicine daily to prevent HIV infection. This is called pre-exposure prophylaxis (PrEP). You are considered at risk if: ? You are sexually active and do not regularly use condoms or know the HIV status of your partner(s). ? You take drugs by injection. ?   injection. ? You are sexually active with a partner who has HIV.  Talk with your health care provider about whether you are at high risk of being infected with HIV. If you choose to begin PrEP, you should first be tested for HIV. You should then be tested every 3 months for as long as you are taking PrEP. Pregnancy  If you are premenopausal and you may become pregnant, ask your health care provider about preconception counseling.  If you may become pregnant, take 400 to 800 micrograms (mcg) of folic acid every day.  If you want to prevent pregnancy, talk to your health care provider about birth control (contraception). Osteoporosis and menopause  Osteoporosis is a disease in which the bones lose minerals and strength with aging. This can result in serious bone fractures. Your risk for osteoporosis can be identified using a bone density scan.  If you are 94 years of age or older, or if you are at risk for osteoporosis and fractures, ask your health care provider if you should be screened.  Ask your health care provider whether you should take a calcium or vitamin D supplement to lower your risk for osteoporosis.  Menopause may have certain physical symptoms and risks.  Hormone replacement therapy may reduce some of these symptoms and risks. Talk to your health care provider about whether hormone replacement therapy is right for you. Follow these instructions at home:  Schedule regular health, dental,  and eye exams.  Stay current with your immunizations.  Do not use any tobacco products including cigarettes, chewing tobacco, or electronic cigarettes.  If you are pregnant, do not drink alcohol.  If you are breastfeeding, limit how much and how often you drink alcohol.  Limit alcohol intake to no more than 1 drink per day for nonpregnant women. One drink equals 12 ounces of beer, 5 ounces of wine, or 1 ounces of hard liquor.  Do not use street drugs.  Do not share needles.  Ask your health care provider for help if you need support or information about quitting drugs.  Tell your health care provider if you often feel depressed.  Tell your health care provider if you have ever been abused or do not feel safe at home. This information is not intended to replace advice given to you by your health care provider. Make sure you discuss any questions you have with your health care provider. Document Released: 12/29/2010 Document Revised: 11/21/2015 Document Reviewed: 03/19/2015 Elsevier Interactive Patient Education  [employer].

## 2016-12-29 NOTE — Assessment & Plan Note (Signed)
Needs it daily Switch to before dinner

## 2016-12-29 NOTE — Assessment & Plan Note (Signed)
Started taking medication two weeks ago Will recheck tsh in 4 weeks

## 2017-01-12 ENCOUNTER — Telehealth: Payer: Self-pay | Admitting: Internal Medicine

## 2017-01-12 NOTE — Telephone Encounter (Signed)
Left message for pt to call back  °

## 2017-01-14 NOTE — Telephone Encounter (Signed)
Left message for patient to call back  

## 2017-01-20 NOTE — Telephone Encounter (Signed)
No return call from patient.  Will await a return call

## 2017-01-25 ENCOUNTER — Other Ambulatory Visit (INDEPENDENT_AMBULATORY_CARE_PROVIDER_SITE_OTHER): Payer: BLUE CROSS/BLUE SHIELD

## 2017-01-25 DIAGNOSIS — E039 Hypothyroidism, unspecified: Secondary | ICD-10-CM | POA: Diagnosis not present

## 2017-01-25 DIAGNOSIS — R202 Paresthesia of skin: Secondary | ICD-10-CM

## 2017-01-25 DIAGNOSIS — Z0000 Encounter for general adult medical examination without abnormal findings: Secondary | ICD-10-CM

## 2017-01-25 LAB — CBC WITH DIFFERENTIAL/PLATELET
BASOS ABS: 0.1 10*3/uL (ref 0.0–0.1)
BASOS PCT: 0.9 % (ref 0.0–3.0)
Eosinophils Absolute: 0.6 10*3/uL (ref 0.0–0.7)
Eosinophils Relative: 7.1 % — ABNORMAL HIGH (ref 0.0–5.0)
HEMATOCRIT: 41.1 % (ref 36.0–46.0)
HEMOGLOBIN: 13.8 g/dL (ref 12.0–15.0)
LYMPHS PCT: 35.7 % (ref 12.0–46.0)
Lymphs Abs: 3 10*3/uL (ref 0.7–4.0)
MCHC: 33.6 g/dL (ref 30.0–36.0)
MCV: 85.8 fl (ref 78.0–100.0)
MONO ABS: 0.5 10*3/uL (ref 0.1–1.0)
Monocytes Relative: 5.5 % (ref 3.0–12.0)
Neutro Abs: 4.2 10*3/uL (ref 1.4–7.7)
Neutrophils Relative %: 50.8 % (ref 43.0–77.0)
Platelets: 307 10*3/uL (ref 150.0–400.0)
RBC: 4.79 Mil/uL (ref 3.87–5.11)
RDW: 13.6 % (ref 11.5–15.5)
WBC: 8.3 10*3/uL (ref 4.0–10.5)

## 2017-01-25 LAB — LIPID PANEL
CHOL/HDL RATIO: 4
CHOLESTEROL: 177 mg/dL (ref 0–200)
HDL: 44.6 mg/dL (ref >39.00)
LDL Cholesterol: 112 mg/dL — ABNORMAL HIGH (ref 0–99)
NonHDL: 131.98
TRIGLYCERIDES: 101 mg/dL (ref 0.0–149.0)
VLDL: 20.2 mg/dL (ref 0.0–40.0)

## 2017-01-25 LAB — COMPREHENSIVE METABOLIC PANEL
ALBUMIN: 4.5 g/dL (ref 3.5–5.2)
ALT: 8 U/L (ref 0–35)
AST: 13 U/L (ref 0–37)
Alkaline Phosphatase: 62 U/L (ref 39–117)
BILIRUBIN TOTAL: 0.9 mg/dL (ref 0.2–1.2)
BUN: 20 mg/dL (ref 6–23)
CALCIUM: 9.7 mg/dL (ref 8.4–10.5)
CHLORIDE: 103 meq/L (ref 96–112)
CO2: 28 mEq/L (ref 19–32)
CREATININE: 0.86 mg/dL (ref 0.40–1.20)
GFR: 79.29 mL/min (ref >60.00)
Glucose, Bld: 94 mg/dL (ref 70–99)
Potassium: 4 mEq/L (ref 3.5–5.1)
SODIUM: 136 meq/L (ref 135–145)
Total Protein: 7.4 g/dL (ref 6.0–8.3)

## 2017-01-25 LAB — VITAMIN B12: Vitamin B-12: 594 pg/mL (ref 211–911)

## 2017-01-25 LAB — TSH: TSH: 5.53 u[IU]/mL — AB (ref 0.35–4.50)

## 2017-01-25 NOTE — Telephone Encounter (Signed)
Pt states she has been having RUQ abd pain and mucous in her stool. Pt does not feel she can wait until the 15th to be seen. Pt scheduled to see Amy Esterwood PA 01/29/17@2 :30pm. Pt aware of appt.

## 2017-01-27 ENCOUNTER — Telehealth: Payer: Self-pay | Admitting: Internal Medicine

## 2017-01-27 ENCOUNTER — Other Ambulatory Visit: Payer: Self-pay | Admitting: Emergency Medicine

## 2017-01-27 DIAGNOSIS — E039 Hypothyroidism, unspecified: Secondary | ICD-10-CM

## 2017-01-27 NOTE — Telephone Encounter (Signed)
That's ok.

## 2017-01-27 NOTE — Telephone Encounter (Signed)
Spoke with pt to inform.  

## 2017-01-27 NOTE — Telephone Encounter (Signed)
Patient has called back in.  I gave MD response on labs.  Patient would like to know if she could not increase her dosage and come back in two weeks for recheck on thyroid.  Please contact patient in regard.

## 2017-01-29 ENCOUNTER — Ambulatory Visit (INDEPENDENT_AMBULATORY_CARE_PROVIDER_SITE_OTHER): Payer: BLUE CROSS/BLUE SHIELD | Admitting: Physician Assistant

## 2017-01-29 ENCOUNTER — Encounter: Payer: Self-pay | Admitting: Physician Assistant

## 2017-01-29 VITALS — BP 110/72 | HR 68 | Ht 62.0 in | Wt 109.0 lb

## 2017-01-29 DIAGNOSIS — R109 Unspecified abdominal pain: Secondary | ICD-10-CM

## 2017-01-29 DIAGNOSIS — R195 Other fecal abnormalities: Secondary | ICD-10-CM

## 2017-01-29 DIAGNOSIS — R1011 Right upper quadrant pain: Secondary | ICD-10-CM | POA: Diagnosis not present

## 2017-01-29 DIAGNOSIS — K219 Gastro-esophageal reflux disease without esophagitis: Secondary | ICD-10-CM

## 2017-01-29 NOTE — Patient Instructions (Signed)
Your physician has requested that you go to the basement for lab work before leaving today.  [facility] of [facility]) on 02/02/17 at 7:30am. Please arrive 15 minutes prior to your appointment for registration. Make certain not to have anything to eat or drink 6 hours prior to your appointment. Should you need to reschedule your appointment, please contact radiology at [phone]. This test typically takes about 30 minutes to perform.

## 2017-01-29 NOTE — Progress Notes (Signed)
Subjective:    Patient ID: Mindy Bright, female    DOB: 12-27-1980, 36 y.o.   MRN: [id number]  HPI Mindy Bright  is a pleasant 36 year old white female, known to Dr. Marina Bright who comes in today with complaints of right upper quadrant pain and also new complaint of mucus in her stools. Patient was seen in December 2017 initially, at that time had been having intermittent right upper quadrant pain in the past but had not had any recent issues. She was treated with low-dose PPI for GERD symptoms and outside records were to be obtained. She apparently had prior HIDA scan which she now says was possibly longer than 5 years ago in [place] that showed a low EF of 28%.. She had also been noted to have a mild alkaline phosphatase elevation. Subsequent GGT was normal and antimitochondrial antibody normal. Patient comes back in today stating that she's been noticing somewhat different right-sided discomfort over the past couple of weeks. She says this is been intermittent but seems to be exacerbated by eating. She says it's not severe but more dull in nature area and she points to her right upper quadrant and right flank area. She has not had any associated nausea or vomiting no fever or chills and no changes in her bowel habits. She has been intentionally eating lower fat. She says that over the past several weeks her bowel movements have been somewhat more in pieces rather than completely formed but she is having regular bowel movements. She has noticed on a few occasions some mucus with wiping which does have an odor. She says she has passed just a little bit of mucus on its own also. She has no complaints of rectal discomfort or anal discomfort. There's been no melena or hematochezia. She is worried about an infection, because of odor associated with mucus.  Recent labs by primary care 01/25/2017 WBC of 8.3 hemoglobin 13.8 hematocrit of 41.12 bili 1.3 hepatic enzymes otherwise completely normal  Review  of Systems Pertinent positive and negative review of systems were noted in the above HPI section.  All other review of systems was otherwise negative.  Outpatient Encounter Prescriptions as of 01/29/2017  Medication Sig  . levothyroxine (SYNTHROID, LEVOTHROID) 25 MCG tablet Take 1 tablet (25 mcg total) by mouth daily before breakfast.  . omeprazole (PRILOSEC) 40 MG capsule Take 1 capsule (40 mg total) by mouth daily.   No facility-administered encounter medications on file as of 01/29/2017.    Allergies  Allergen Reactions  . Penicillins Swelling    Has patient had a PCN reaction causing immediate rash, facial/tongue/throat swelling, SOB or lightheadedness with hypotension: Yes Has patient had a PCN reaction causing severe rash involving mucus membranes or skin necrosis: No Has patient had a PCN reaction that required hospitalization Yes Has patient had a PCN reaction occurring within the last 10 years: No If all of the above answers are "NO", then may proceed with Cephalosporin use.   Patient Active Problem List   Diagnosis Date Noted  . Tingling of skin 12/29/2016  . Cholelithiasis 12/29/2016  . History of abnormal cervical Pap smear 12/25/2016  . Hypothyroidism 09/06/2016  . GERD (gastroesophageal reflux disease) 11/09/2014   Social History   Social History  . Marital status: Married    Spouse name: N/A  . Number of children: 1  . Years of education: N/A   Occupational History  . Not on file.   Social History Main Topics  . Smoking status: Former Smoker  Packs/day: 0.25    Years: 3.00    Types: Cigarettes    Quit date: 07/14/2002  . Smokeless tobacco: Never Used  . Alcohol use No     Comment: not now  . Drug use: No  . Sexual activity: Yes    Birth control/ protection: None, Condom   Other Topics Concern  . Not on file   Social History Narrative  . No narrative on file    Ms. Mindy Bright's family history includes Diabetes in her maternal grandmother, mother, and  paternal grandfather; Gout in her brother; Heart disease in her maternal grandfather and paternal grandfather; Hypertension in her father.      Objective:    Vitals:   01/29/17 1430  BP: 110/72  Pulse: 68    Physical Exam  ;Well-developed white female in no acute distress, pleasant blood pressure 110/72 pulse 68,, height 5 foot 2, weight 109, BMI 19.9. HEENT; nontraumatic normocephalic EOMI PERRLA sclera anicteric, Cardiovascular r;egular rate and rhythm with S1-S2 no murmur rub or gallop, Pulmonary ;clear bilaterally, Abdomen ;soft, nontender nondistended bowel sounds are active there is no palpable mass or hepatosplenomegaly, Rectal ;exam no external hemorrhoids noted, no evidence of anal fissure or fistula, no obvious mucoid discharge on digital exam or anoscopy and no anorectal lesion appreciated, Extremities; no clubbing cyanosis or edema skin warm and dry, Neuropsych; mood and affect appropriate       Assessment & Plan:   #391 36 year old white female with recurrent intermittent right upper quadrant and right flank discomfort 3 weeks. Etiology is not clear, patient had complaints of right upper quadrant pain in the past. Last ultrasound in 2016 no gallstones and remote HIDA scan apparently with mildly decreased ejection fraction. Rule out symptomatic cholelithiasis, rule out biliary dyskinesia  #2 GERD-stable-on omeprazole 40 mg daily #3 change in stools with mucus-rule out component of IBS, rule out infectious etiology, no evidence for anal fistula by exam  Plan; Will repeat upper abdominal ultrasound, if ultrasound unrevealing then [facility] HIDA scan Check stool for lactoferrin, O&P and culture  Nhat [place]ne Oswald HillockS Arun Herrod PA-C 01/29/2017   Cc: Pincus SanesBurns, Stacy J, MD

## 2017-01-31 NOTE — Progress Notes (Signed)
Assessment and plan reviewed 

## 2017-02-02 ENCOUNTER — Ambulatory Visit (HOSPITAL_COMMUNITY)
Admission: RE | Admit: 2017-02-02 | Discharge: 2017-02-02 | Disposition: A | Discharge status: Home / Self Care | Payer: BLUE CROSS/BLUE SHIELD | Source: Ambulatory Visit | Attending: Physician Assistant | Admitting: Physician Assistant

## 2017-02-02 DIAGNOSIS — R1011 Right upper quadrant pain: Secondary | ICD-10-CM | POA: Insufficient documentation

## 2017-02-02 DIAGNOSIS — R109 Unspecified abdominal pain: Secondary | ICD-10-CM | POA: Diagnosis not present

## 2017-02-03 ENCOUNTER — Other Ambulatory Visit: Payer: Self-pay

## 2017-02-03 DIAGNOSIS — R1011 Right upper quadrant pain: Secondary | ICD-10-CM

## 2017-02-09 ENCOUNTER — Ambulatory Visit (HOSPITAL_COMMUNITY)
Admission: RE | Admit: 2017-02-09 | Discharge: 2017-02-09 | Disposition: A | Discharge status: Home / Self Care | Payer: BLUE CROSS/BLUE SHIELD | Source: Ambulatory Visit | Attending: Physician Assistant | Admitting: Physician Assistant

## 2017-02-09 DIAGNOSIS — R1011 Right upper quadrant pain: Secondary | ICD-10-CM | POA: Insufficient documentation

## 2017-02-09 MED ORDER — TECHNETIUM TC 99M MEBROFENIN IV KIT
5.3000 | PACK | Freq: Once | INTRAVENOUS | Status: AC | PRN
  Administered 2017-02-09: 5.3 via INTRAVENOUS

## 2017-02-10 ENCOUNTER — Ambulatory Visit: Payer: BLUE CROSS/BLUE SHIELD | Admitting: Internal Medicine

## 2017-02-12 ENCOUNTER — Telehealth: Payer: Self-pay | Admitting: Physician Assistant

## 2017-02-12 NOTE — Telephone Encounter (Signed)
Advised the results have not been reviewed by her provider yet, but the impression reads "normal".

## 2017-02-17 ENCOUNTER — Other Ambulatory Visit: Payer: Self-pay

## 2017-02-17 ENCOUNTER — Telehealth: Payer: Self-pay | Admitting: Physician Assistant

## 2017-02-17 NOTE — Telephone Encounter (Signed)
Patient calling back states she has further questions states she is still having pain. Best call back number is [phone].

## 2017-02-17 NOTE — Telephone Encounter (Signed)
Is there a next step for her symptoms?

## 2017-02-17 NOTE — Telephone Encounter (Signed)
Pt aware.

## 2017-02-17 NOTE — Telephone Encounter (Signed)
I apologize for delay - did not see result pop up in my results - it was normal, so normal GB function

## 2017-02-18 ENCOUNTER — Telehealth: Payer: Self-pay | Admitting: Physician Assistant

## 2017-02-18 ENCOUNTER — Other Ambulatory Visit: Payer: Self-pay

## 2017-02-18 DIAGNOSIS — R195 Other fecal abnormalities: Secondary | ICD-10-CM

## 2017-02-18 MED ORDER — GLYCOPYRROLATE 2 MG PO TABS: 2.0000 mg | ORAL_TABLET | Freq: Two times a day (BID) | ORAL | 0 refills | Status: DC | PRN

## 2017-02-18 NOTE — Telephone Encounter (Signed)
Left message to call back  

## 2017-02-18 NOTE — Telephone Encounter (Signed)
Duplicated message. Already has an open message.

## 2017-02-18 NOTE — Telephone Encounter (Signed)
If still having symptoms would do stool studies I ordered, schedule for CT abd/pelvis with contrast , and can offer  an antispasmotic to see if helps with pain- robinul forte 2 mg po BID ,

## 2017-02-18 NOTE — Telephone Encounter (Signed)
Spoke with the patient. Faxed orders for her labs to the [facility] in [place]. Driving to [place] with a stool specimen has been difficult to coordinate. She will try the antispasmodic. Follow up by phone.

## 2017-02-19 ENCOUNTER — Other Ambulatory Visit: Payer: Self-pay

## 2017-02-19 ENCOUNTER — Other Ambulatory Visit: Payer: BLUE CROSS/BLUE SHIELD

## 2017-02-19 DIAGNOSIS — R198 Other specified symptoms and signs involving the digestive system and abdomen: Secondary | ICD-10-CM

## 2017-02-19 DIAGNOSIS — R195 Other fecal abnormalities: Secondary | ICD-10-CM

## 2017-02-19 DIAGNOSIS — R109 Unspecified abdominal pain: Secondary | ICD-10-CM

## 2017-02-22 LAB — OVA AND PARASITE EXAMINATION: OP: NONE SEEN

## 2017-02-22 LAB — FECAL LACTOFERRIN, QUANT: LACTOFERRIN: NEGATIVE

## 2017-02-23 LAB — STOOL CULTURE

## 2017-02-26 ENCOUNTER — Telehealth: Payer: Self-pay

## 2017-02-26 NOTE — Telephone Encounter (Signed)
-----   Message from Sammuel CooperAmy S Esterwood, PA-C sent at 02/25/2017 12:26 PM EDT ----- Please call pt and find out what sxs she is still having - was having RUQ pain, and some mucous in stool - workup negative to date - I think sxs likely IBS related - Colonoscopy and EGD with Dr Marina GoodellPerry  Would be next step

## 2017-02-26 NOTE — Telephone Encounter (Signed)
Patient is a Runner, broadcasting/film/videoteacher in school. Difficult for her to talk. Will send her an email.

## 2017-03-19 ENCOUNTER — Other Ambulatory Visit (INDEPENDENT_AMBULATORY_CARE_PROVIDER_SITE_OTHER): Payer: BLUE CROSS/BLUE SHIELD

## 2017-03-19 DIAGNOSIS — E039 Hypothyroidism, unspecified: Secondary | ICD-10-CM | POA: Diagnosis not present

## 2017-03-19 LAB — TSH: TSH: 4.06 u[IU]/mL (ref 0.35–4.50)

## 2017-03-20 ENCOUNTER — Encounter: Payer: Self-pay | Admitting: Internal Medicine

## 2017-03-20 ENCOUNTER — Other Ambulatory Visit: Payer: Self-pay | Admitting: Internal Medicine

## 2017-03-20 DIAGNOSIS — E039 Hypothyroidism, unspecified: Secondary | ICD-10-CM

## 2017-03-22 MED ORDER — LEVOTHYROXINE SODIUM 25 MCG PO TABS: 25.0000 ug | ORAL_TABLET | Freq: Every day | ORAL | 3 refills | Status: DC

## 2017-03-22 MED ORDER — OMEPRAZOLE 40 MG PO CPDR: 40.0000 mg | DELAYED_RELEASE_CAPSULE | Freq: Every day | ORAL | 3 refills | Status: DC

## 2017-04-27 ENCOUNTER — Ambulatory Visit (INDEPENDENT_AMBULATORY_CARE_PROVIDER_SITE_OTHER): Payer: BLUE CROSS/BLUE SHIELD | Admitting: Internal Medicine

## 2017-04-27 ENCOUNTER — Encounter: Payer: Self-pay | Admitting: Internal Medicine

## 2017-04-27 VITALS — BP 94/60 | HR 80 | Ht 62.0 in | Wt 112.0 lb

## 2017-04-27 DIAGNOSIS — K219 Gastro-esophageal reflux disease without esophagitis: Secondary | ICD-10-CM

## 2017-04-27 DIAGNOSIS — R1011 Right upper quadrant pain: Secondary | ICD-10-CM

## 2017-04-27 NOTE — Patient Instructions (Signed)
Please follow up as needed 

## 2017-04-27 NOTE — Progress Notes (Signed)
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 She has had this for greater than 10 years. She describes these as "gallbladder attacks". I saw her initially December 2017. See that dictation. She was to follow-up in a few months but did not. She saw the GI physician assistant 01/29/2017 with recurrent complaints. Workup including abdominal ultrasound and HIDA scan were normal. She tells me that she has had no problem with pain in 2 months. She does stay on regular PPI which controls her reflux symptoms. She tells me that the discomfort often occurs after evening meal aerated it is described as dull in the right upper quadrant with radiation around the right side and right scapular region. Typically last for hours but may last up to a day and a half. She may have several episodes per week or go months without an episode. No associated nausea or vomiting. No swallowing difficulty. She does report remote upper endoscopy for other reasons elsewhere and was told she had a small hiatal hernia. Previously noted to have mild elevation of alkaline phosphatase with normal GGTP and AMA suggesting non-liver origin. She is looking for answers regarding her discomfort. She does feel that greasy meals may exacerbate symptoms. She does not notice change of position makes any difference. However, direct massage and ibuprofen seem to help. At the time of her last visit she was also having some mucoid stools with foul odor. This has resolved  REVIEW OF SYSTEMS:  All non-GI ROS negative except for sinus allergy trouble, fatigue  Past Medical History:  Diagnosis Date  . Abnormal Pap smear   . Anemia   . Anxiety   . Gall bladder disease   . GERD (gastroesophageal reflux disease)   . H/O hiatal hernia   . Irregular periods 04/30/2014  . Patient desires pregnancy  12/11/2013  . Pregnant 10/26/2014  . Reflux 11/09/2014  . Thyroid disease   . Unspecified symptom associated with female genital organs 12/11/2013  . Vaginal discharge 12/11/2013  . Vaginal Pap smear, abnormal    >10 years ago, no treatment    Past Surgical History:  Procedure Laterality Date  . WISDOM TOOTH EXTRACTION      Social History Mindy Bright  reports that she quit smoking about 14 years ago. Her smoking use included Cigarettes. She has a 0.75 pack-year smoking history. She has never used smokeless tobacco. She reports that she does not drink alcohol or use drugs.  family history includes Diabetes in her maternal grandmother, mother, and paternal grandfather; Gout in her brother; Heart disease in her maternal grandfather and paternal grandfather; Hypertension in her father.  Allergies  Allergen Reactions  . Penicillins Swelling    Has patient had a PCN reaction causing immediate rash, facial/tongue/throat swelling, SOB or lightheadedness with hypotension: Yes Has patient had a PCN reaction causing severe rash involving mucus membranes or skin necrosis: No Has patient had a PCN reaction that required hospitalization Yes Has patient had a PCN reaction occurring within the last 10 years: No If all of the above answers are "NO", then may proceed with Cephalosporin use.       PHYSICAL EXAMINATION: Vital signs: BP 94/60   Pulse 80   Ht 5\' 2"  (1.575 m)   Wt 112 lb (50.8 kg)   BMI 20.49 kg/m   Constitutional: generally well-appearing, no acute distress Psychiatric: alert and  oriented x3, cooperative Eyes: extraocular movements intact, anicteric, conjunctiva pink Mouth: oral pharynx moist, no lesions Neck: supple no lymphadenopathy Cardiovascular: heart regular rate and rhythm, no murmur Lungs: clear to auscultation bilaterally Abdomen: soft, nontender, nondistended, no obvious ascites, no peritoneal signs, normal bowel sounds, no organomegaly Rectal:  Omitted Extremities: no clubbing, cyanosis, or lower extremity edema bilaterally Skin: no lesions on visible extremities Neuro: No focal deficits. Cranial nerves intact  ASSESSMENT:  #1. Atypical recurrent right upper quadrant pain of greater than 10 years duration. Negative workup. No evidence for gallbladder disease objectively. Some features consistent with musculoskeletal. No alarm features #2. GERD. Requires PPI for control of symptoms   PLAN:  #1. Discussion regarding possible etiologies. Discussed that there is no objective evidence for gallbladder disease #2. Reassurance #3. Local Massage and ibuprofen as needed, as this helps #4. Reflux precautions #5. Continue PPI. Lowest dose to control reflux symptoms #6. Routine GI follow-up one year  25 minutes spent face-to-face with the patient. Greater than 50% a time use for counseling regarding her chronic right upper quadrant pain

## 2017-06-29 NOTE — L&D Delivery Note (Signed)
Delivery Note Mindy Bright is a 37 y.o. G2P1001 at [redacted]w[redacted]d admitted for active labor.  Labor course: augmented w/ AROM At 0807 a viable female was delivered via spontaneous vaginal delivery (Presentation: LOA).  Infant placed directly on mom's abdomen for bonding/skin-to-skin. Delayed cord clamping x , then cord clamped x 2, and cut by FOB.  APGAR: , ; weight: pending at time of note.  40 units of pitocin diluted in 1000cc LR was infused rapidly IV per protocol. The placenta separated spontaneously and delivered via CCT and maternal pushing effort.  It was inspected and appears to be intact with a 3 VC.  Placenta/Cord with the following complications: none .  Cord pH: not done  Intrapartum complications:  None Anesthesia:  epidural Episiotomy: none Lacerations:  1st degree Suture Repair: 3.0 vicryl by Dr. Charlynn Grimes Est. Blood Loss (mL): 50 Sponge and instrument count were correct x2.  Mom to postpartum.  Baby to Couplet care / Skin to Skin. Placenta to L&D. Plans to breastfeed Contraception: undecided Circ: in-hospital  Cheral Marker CNM, [facility] 05/04/2018 8:18 AM   Grace Bushy, Merlene Laughter, CNM  Noreene Larsson, RMA        Please schedule this patient for PP visit in: 4-6 weeks  Low risk pregnancy complicated by: none  Delivery mode: SVD  Anticipated Birth Control: other/unsure  PP Procedures needed: none  Schedule Integrated BH visit: no  Provider: Any provider

## 2017-07-19 ENCOUNTER — Telehealth: Payer: Self-pay | Admitting: Internal Medicine

## 2017-07-19 NOTE — Telephone Encounter (Signed)
Left message for pt to call back.  Pt states she is taking omeprazole 40mg  in the am and zantac in the evening but still having issues with reflux. Discussed with pt that she should continue taking this and may want to add mylanta/tums as needed until her appt later this week. Pt verbalized understanding.

## 2017-07-23 ENCOUNTER — Ambulatory Visit: Payer: BLUE CROSS/BLUE SHIELD | Admitting: Gastroenterology

## 2017-07-23 ENCOUNTER — Encounter: Payer: Self-pay | Admitting: Gastroenterology

## 2017-07-23 VITALS — BP 100/60 | HR 72 | Ht 62.0 in | Wt 118.0 lb

## 2017-07-23 DIAGNOSIS — K219 Gastro-esophageal reflux disease without esophagitis: Secondary | ICD-10-CM | POA: Diagnosis not present

## 2017-07-23 DIAGNOSIS — R059 Cough, unspecified: Secondary | ICD-10-CM

## 2017-07-23 DIAGNOSIS — R05 Cough: Secondary | ICD-10-CM | POA: Diagnosis not present

## 2017-07-23 MED ORDER — RANITIDINE HCL 150 MG PO TABS: 150.0000 mg | ORAL_TABLET | Freq: Every day | ORAL | 1 refills | Status: DC

## 2017-07-23 MED ORDER — PANTOPRAZOLE SODIUM 40 MG PO TBEC: 40.0000 mg | DELAYED_RELEASE_TABLET | Freq: Two times a day (BID) | ORAL | 1 refills | Status: DC

## 2017-07-23 NOTE — Patient Instructions (Signed)
If you are age 37 or older, your body mass index should be between 23-30. Your Body mass index is 21.58 kg/m. If this is out of the aforementioned range listed, please consider follow up with your Primary Care Provider.  If you are age 37 or younger, your body mass index should be between 19-25. Your Body mass index is 21.58 kg/m. If this is out of the aformentioned range listed, please consider follow up with your Primary Care Provider.   We have sent the following medications to your pharmacy for you to pick up at your convenience: Pantoprazole Zantac  STOP Omeprazole.  Call back in 3-4 weeks with an update.  Ask for Patty.  Thank you for choosing me and [facility].   Doug SouJessica Zehr, PA-C

## 2017-07-23 NOTE — Progress Notes (Signed)
07/23/2017 Mindy Bright [id number] [birth year]   HISTORY OF PRESENT ILLNESS: This is a pleasant 37 year old female who is known to Dr. Marina Goodell.  She has long-standing acid reflux issues and says that she has been on omeprazole probably for about the past 15 years.  She comes in today with complaints of reflux and a cough that has been present for the past 2-3 weeks.  She says that she has had some burning in her throat and just this irritating cough.  She says that she is tried Tums and Mylanta.  She denies any recent cold/upper respiratory illness.  She says that she saw ENT in [place] and they just examined her in the office and told her that they did not see any sinus/postnasal drip type of issues that should be contributing to her symptoms.  She reports that she had an EGD several years ago in her 74s and they just found a small hiatal hernia at that time.    Past Medical History:  Diagnosis Date  . Abnormal Pap smear   . Anemia   . Anxiety   . Gall bladder disease   . GERD (gastroesophageal reflux disease)   . H/O hiatal hernia   . Irregular periods 04/30/2014  . Patient desires pregnancy 12/11/2013  . Pregnant 10/26/2014  . Reflux 11/09/2014  . Thyroid disease   . Unspecified symptom associated with female genital organs 12/11/2013  . Vaginal discharge 12/11/2013  . Vaginal Pap smear, abnormal    >10 years ago, no treatment   Past Surgical History:  Procedure Laterality Date  . WISDOM TOOTH EXTRACTION      reports that she quit smoking about 15 years ago. Her smoking use included cigarettes. She has a 0.75 pack-year smoking history. she has never used smokeless tobacco. She reports that she does not drink alcohol or use drugs. family history includes Diabetes in her maternal grandmother, mother, and paternal grandfather; Gout in her brother; Heart disease in her maternal grandfather and paternal grandfather; Hypertension in her father. Allergies  Allergen Reactions  .  Penicillins Swelling    Has patient had a PCN reaction causing immediate rash, facial/tongue/throat swelling, SOB or lightheadedness with hypotension: Yes Has patient had a PCN reaction causing severe rash involving mucus membranes or skin necrosis: No Has patient had a PCN reaction that required hospitalization Yes Has patient had a PCN reaction occurring within the last 10 years: No If all of the above answers are "NO", then may proceed with Cephalosporin use.      Outpatient Encounter Medications as of 07/23/2017  Medication Sig  . levothyroxine (SYNTHROID, LEVOTHROID) 25 MCG tablet Take 1 tablet (25 mcg total) by mouth daily before breakfast.  . omeprazole (PRILOSEC) 40 MG capsule Take 1 capsule (40 mg total) by mouth daily.   No facility-administered encounter medications on file as of 07/23/2017.      REVIEW OF SYSTEMS  : All other systems reviewed and negative except where noted in the History of Present Illness.   PHYSICAL EXAM: BP 100/60   Pulse 72   Ht 5\' 2"  (1.575 m)   Wt 118 lb (53.5 kg)   BMI 21.58 kg/m  General: Well developed white female in no acute distress Head: Normocephalic and atraumatic Eyes:  Sclerae anicteric, conjunctiva pink. Ears: Normal auditory acuity Lungs: Clear throughout to auscultation; no increased WOB. Heart: Regular rate and rhythm; no M/R/G. Abdomen: Soft, non-distended.  BS present.  Non-tender. Musculoskeletal: Symmetrical with no gross deformities  Skin:  No lesions on visible extremities Extremities: No edema  Neurological: Alert oriented x 4, grossly non-focal Psychological:  Alert and cooperative. Normal mood and affect  ASSESSMENT AND PLAN: *37 year old female with history of GERD and now with complaints of cough over the past 2-3 weeks.  ENT found nothing to cause her symptoms.  ? If this is reflux related.  We discussed possible EGD, but we will start by switching her PPI to pantoprazole 40 mg and increase it to twice daily.  Will  also have her start zantac 150 mg at bedtime to see if this maximum acid suppressing regimen helps her symptoms at all.  She will call back in 3-4 weeks with an update on her symptoms.   CC:  Pincus SanesBurns, Stacy J, MD

## 2017-07-23 NOTE — Progress Notes (Signed)
Physician assistant assessment and plan reviewed 

## 2017-08-03 ENCOUNTER — Telehealth: Payer: Self-pay | Admitting: Obstetrics & Gynecology

## 2017-08-03 NOTE — Telephone Encounter (Signed)
Has missed a period, HOT negative, just watch if gets to be no period in 3 months, make appt

## 2017-08-03 NOTE — Telephone Encounter (Signed)
Patient called stating that she would like to speak with a nurse, patient did not  let me know the reason why. Please contact pt

## 2017-08-03 NOTE — Telephone Encounter (Signed)
Patient states she is 10 days late starting her period. She has taken multiple pregnancy tests and all are negative. She is sexually active, not using BC. She was started on Synthroid last September by her PCP and doesn't know if it could be related to her thyroid. She normally has a period every month on time. Please advise.

## 2017-08-05 ENCOUNTER — Encounter: Payer: Self-pay | Admitting: Adult Health

## 2017-08-05 ENCOUNTER — Telehealth: Payer: Self-pay | Admitting: Adult Health

## 2017-08-05 ENCOUNTER — Other Ambulatory Visit: Payer: Self-pay

## 2017-08-05 MED ORDER — PANTOPRAZOLE SODIUM 40 MG PO TBEC: 40.0000 mg | DELAYED_RELEASE_TABLET | Freq: Every day | ORAL | 3 refills | Status: DC

## 2017-08-05 NOTE — Telephone Encounter (Signed)
Patient called stating that she spoke with someone on Tuesday regarding a situation she was having but pt states she has another question. Please contact pt

## 2017-08-05 NOTE — Telephone Encounter (Signed)
Patient states she started her period on Tuesday but noticed 2 quarter sized clots on the end of her tampon. States her period has been "weird" since she started. Took a picture of the clot. Advised patient to upload the picture and send through mychart. Verbalized understanding.

## 2017-08-05 NOTE — Telephone Encounter (Signed)
Received notice from [facility] that plan does not cover medication.  New prescription sent for Pantroprazole 40 mg daily instead of twice daily Per Doug SouJessica Zehr, PA.

## 2017-09-22 ENCOUNTER — Ambulatory Visit: Payer: BLUE CROSS/BLUE SHIELD | Admitting: Advanced Practice Midwife

## 2017-09-22 ENCOUNTER — Encounter: Payer: Self-pay | Admitting: Advanced Practice Midwife

## 2017-09-22 VITALS — BP 110/60 | HR 84 | Ht 62.0 in | Wt 119.0 lb

## 2017-09-22 DIAGNOSIS — Z3201 Encounter for pregnancy test, result positive: Secondary | ICD-10-CM

## 2017-09-22 DIAGNOSIS — E039 Hypothyroidism, unspecified: Secondary | ICD-10-CM

## 2017-09-22 DIAGNOSIS — N926 Irregular menstruation, unspecified: Secondary | ICD-10-CM

## 2017-09-22 DIAGNOSIS — Z3401 Encounter for supervision of normal first pregnancy, first trimester: Secondary | ICD-10-CM | POA: Diagnosis not present

## 2017-09-22 DIAGNOSIS — Z3481 Encounter for supervision of other normal pregnancy, first trimester: Secondary | ICD-10-CM

## 2017-09-22 LAB — POCT URINE PREGNANCY: PREG TEST UR: POSITIVE — AB

## 2017-09-22 NOTE — Progress Notes (Signed)
[facility] Visit  Patient name: Mindy DadaMelissa C Bright MRN [id number]  Date of birth: January 12, 1981  CC & HPI:  Mindy Bright is a 37 y.o. Caucasian female presenting today for pg test.  LMP (sure) 2/7.  On  Synthroid 25 mcg, needs TSH checked.  Has had some LBP, ant thighs feel "sore".  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   Pertinent History Reviewed:  Medical & Surgical Hx:   Past Medical History:  Diagnosis Date  . Abnormal Pap smear   . Anemia   . Anxiety   . Gall bladder disease   . GERD (gastroesophageal reflux disease)   . H/O hiatal hernia   . Irregular periods 04/30/2014  . Patient desires pregnancy 12/11/2013  . Pregnant 10/26/2014  . Reflux 11/09/2014  . Thyroid disease   . Unspecified symptom associated with female genital organs 12/11/2013  . Vaginal discharge 12/11/2013  . Vaginal Pap smear, abnormal    >10 years ago, no treatment   Past Surgical History:  Procedure Laterality Date  . WISDOM TOOTH EXTRACTION     Family History  Problem Relation Age of Onset  . Diabetes Mother   . Heart disease Paternal Grandfather   . Diabetes Paternal Grandfather   . Diabetes Maternal Grandmother   . Hypertension Father   . Heart disease Maternal Grandfather   . Gout Brother   . Colon cancer Neg Hx   . Stomach cancer Neg Hx   . Pancreatic cancer Neg Hx   . Esophageal cancer Neg Hx   . Liver disease Neg Hx     Current Outpatient Medications:  .  levothyroxine (SYNTHROID, LEVOTHROID) 25 MCG tablet, Take 1 tablet (25 mcg total) by mouth daily before breakfast., Disp: 90 tablet, Rfl: 3 .  omeprazole (PRILOSEC) 10 MG capsule, Take 10 mg by mouth daily., Disp: , Rfl:  .  pantoprazole (PROTONIX) 40 MG tablet, Take 1 tablet (40 mg total) by mouth daily. (Patient not taking: Reported on 09/22/2017), Disp: 30 tablet, Rfl: 3 .  ranitidine (ZANTAC) 150 MG tablet, Take 1 tablet (150 mg total) by mouth at bedtime. (Patient not taking: Reported on 09/22/2017),  Disp: 30 tablet, Rfl: 1 Social History: Reviewed -  reports that she quit smoking about 15 years ago. Her smoking use included cigarettes. She has a 0.75 pack-year smoking history. She has never used smokeless tobacco.  Review of Systems:   Constitutional: Negative for fever and chills Eyes: Negative for visual disturbances Respiratory: Negative for shortness of breath, dyspnea Cardiovascular: Negative for chest pain or palpitations  Gastrointestinal: Negative for vomiting, diarrhea and constipation; no abdominal pain Genitourinary: Negative for dysuria and urgency, vaginal irritation or itching Musculoskeletal:ant thigh soreness, some mild LBP Neurological: Negative for dizziness and headaches.  Had some "squiggles" followed by a Frontal mid forehead HA for about 20 minutes a few weeks ago. None since.     Objective Findings:    Physical Examination: General appearance - well appearing, and in no distress Mental status - alert, oriented to person, place, and time Chest:  Normal respiratory effort Heart - normal rate and regular rhythm Abdomen:  Soft, nontender Pelvic: deferred Musculoskeletal:  Normal range of motion without pain Extremities:  No edema    Results for orders placed or performed in visit on 09/22/17 (from the past 24 hour(s))  POCT urine pregnancy   Collection Time: 09/22/17 10:34 AM  Result Value Ref Range   Preg Test, Ur Positive (A) Negative  Assessment & Plan:  A:   Pregnant,   Hypothyroid   P:  Check TSH/new ob panel today   Return 1-2 weeks, for dating [place]; then New OB.Scarlette Calico Cresenzo-Dishmon CNM 09/22/2017 10:58 AM

## 2017-09-23 ENCOUNTER — Other Ambulatory Visit: Payer: Self-pay | Admitting: Advanced Practice Midwife

## 2017-09-23 ENCOUNTER — Encounter: Payer: Self-pay | Admitting: Advanced Practice Midwife

## 2017-09-23 LAB — CBC WITH DIFFERENTIAL/PLATELET
BASOS: 0 %
Basophils Absolute: 0 10*3/uL (ref 0.0–0.2)
EOS (ABSOLUTE): 0.3 10*3/uL (ref 0.0–0.4)
EOS: 2 %
HEMOGLOBIN: 13.5 g/dL (ref 11.1–15.9)
Hematocrit: 41.7 % (ref 34.0–46.6)
IMMATURE GRANS (ABS): 0 10*3/uL (ref 0.0–0.1)
IMMATURE GRANULOCYTES: 0 %
LYMPHS: 24 %
Lymphocytes Absolute: 2.9 10*3/uL (ref 0.7–3.1)
MCH: 28.7 pg (ref 26.6–33.0)
MCHC: 32.4 g/dL (ref 31.5–35.7)
MCV: 89 fL (ref 79–97)
MONOCYTES: 6 %
Monocytes Absolute: 0.7 10*3/uL (ref 0.1–0.9)
NEUTROS ABS: 8 10*3/uL — AB (ref 1.4–7.0)
NEUTROS PCT: 68 %
Platelets: 347 10*3/uL (ref 150–379)
RBC: 4.71 x10E6/uL (ref 3.77–5.28)
RDW: 13.9 % (ref 12.3–15.4)
WBC: 11.9 10*3/uL — ABNORMAL HIGH (ref 3.4–10.8)

## 2017-09-23 LAB — HEPATITIS B SURFACE ANTIGEN: Hepatitis B Surface Ag: NEGATIVE

## 2017-09-23 LAB — RUBELLA SCREEN: Rubella Antibodies, IGG: 1.41 index (ref >0.99)

## 2017-09-23 LAB — RPR: RPR Ser Ql: NONREACTIVE

## 2017-09-23 LAB — TSH: TSH: 5.99 u[IU]/mL — AB (ref 0.450–4.500)

## 2017-09-23 LAB — HIV ANTIBODY (ROUTINE TESTING W REFLEX): HIV Screen 4th Generation wRfx: NONREACTIVE

## 2017-09-23 MED ORDER — LEVOTHYROXINE SODIUM 50 MCG PO TABS: 50.0000 ug | ORAL_TABLET | Freq: Every day | ORAL | 6 refills | Status: DC

## 2017-09-29 ENCOUNTER — Other Ambulatory Visit: Payer: Self-pay | Admitting: Obstetrics and Gynecology

## 2017-09-29 DIAGNOSIS — O3680X0 Pregnancy with inconclusive fetal viability, not applicable or unspecified: Secondary | ICD-10-CM

## 2017-09-30 ENCOUNTER — Ambulatory Visit (INDEPENDENT_AMBULATORY_CARE_PROVIDER_SITE_OTHER): Payer: BLUE CROSS/BLUE SHIELD

## 2017-09-30 DIAGNOSIS — Z3A08 8 weeks gestation of pregnancy: Secondary | ICD-10-CM

## 2017-09-30 DIAGNOSIS — O3680X0 Pregnancy with inconclusive fetal viability, not applicable or unspecified: Secondary | ICD-10-CM | POA: Diagnosis not present

## 2017-09-30 NOTE — Progress Notes (Signed)
US 8 wks,single IUP with YS,positive fht 160 pm,normal ovaries bilat,crl 17.19 mm,EDD 05/12/2018 by LMP

## 2017-10-14 ENCOUNTER — Encounter: Payer: Self-pay | Admitting: Women's Health

## 2017-10-14 ENCOUNTER — Ambulatory Visit (INDEPENDENT_AMBULATORY_CARE_PROVIDER_SITE_OTHER): Payer: BLUE CROSS/BLUE SHIELD | Admitting: Women's Health

## 2017-10-14 ENCOUNTER — Ambulatory Visit: Payer: BLUE CROSS/BLUE SHIELD | Admitting: *Deleted

## 2017-10-14 VITALS — BP 110/70 | HR 74 | Wt 122.0 lb

## 2017-10-14 DIAGNOSIS — Z331 Pregnant state, incidental: Secondary | ICD-10-CM

## 2017-10-14 DIAGNOSIS — Z3A10 10 weeks gestation of pregnancy: Secondary | ICD-10-CM

## 2017-10-14 DIAGNOSIS — Z3481 Encounter for supervision of other normal pregnancy, first trimester: Secondary | ICD-10-CM | POA: Diagnosis not present

## 2017-10-14 DIAGNOSIS — Z1389 Encounter for screening for other disorder: Secondary | ICD-10-CM

## 2017-10-14 DIAGNOSIS — O99281 Endocrine, nutritional and metabolic diseases complicating pregnancy, first trimester: Secondary | ICD-10-CM

## 2017-10-14 DIAGNOSIS — Z3490 Encounter for supervision of normal pregnancy, unspecified, unspecified trimester: Secondary | ICD-10-CM | POA: Insufficient documentation

## 2017-10-14 DIAGNOSIS — O9989 Other specified diseases and conditions complicating pregnancy, childbirth and the puerperium: Secondary | ICD-10-CM

## 2017-10-14 DIAGNOSIS — Z3682 Encounter for antenatal screening for nuchal translucency: Secondary | ICD-10-CM

## 2017-10-14 DIAGNOSIS — R110 Nausea: Secondary | ICD-10-CM

## 2017-10-14 DIAGNOSIS — E039 Hypothyroidism, unspecified: Secondary | ICD-10-CM

## 2017-10-14 LAB — POCT URINALYSIS DIPSTICK
Blood, UA: NEGATIVE
Glucose, UA: NEGATIVE
KETONES UA: NEGATIVE
Leukocytes, UA: NEGATIVE
NITRITE UA: NEGATIVE
PROTEIN UA: NEGATIVE

## 2017-10-14 NOTE — Progress Notes (Signed)
INITIAL OBSTETRICAL VISIT Patient name: Mindy Bright MRN [id number]  Date of birth: 18-Jul-1980 Chief Complaint:   Initial Prenatal Visit (nausea)  History of Present Illness:   Mindy Bright is a 37 y.o. G50P1001 Caucasian female at [redacted]w[redacted]d by LMP c/w 8wk u/s, with an Estimated Date of Delivery: 05/12/18 being seen today for her initial obstetrical visit.   Her obstetrical history is significant for term uncomplicated svb.   Hypothyroidism on synthroid 22mcg-started 09/23/17 Today she reports some nausea, declines meds.  Patient's last menstrual period was 08/05/2017 (exact date). Last pap 12/25/16. Results were: normal Review of Systems:   Pertinent items are noted in HPI Denies cramping/contractions, leakage of fluid, vaginal bleeding, abnormal vaginal discharge w/ itching/odor/irritation, headaches, visual changes, shortness of breath, chest pain, abdominal pain, severe nausea/vomiting, or problems with urination or bowel movements unless otherwise stated above.  Pertinent History Reviewed:  Reviewed past medical,surgical, social, obstetrical and family history.  Reviewed problem list, medications and allergies. OB History  Gravida Para Term Preterm AB Living  2 1 1     1   SAB TAB Ectopic Multiple Live Births        0 1    # Outcome Date GA Lbr Len/2nd Weight Sex Delivery Anes PTL Lv  2 Current           1 Term 06/15/15 [redacted]w[redacted]d 05:09 / 05:08 5 lb 13.3 oz (2.645 kg) F Vag-Spont EPI N LIV   Physical Assessment:   Vitals:   10/14/17 1540  BP: 110/70  Pulse: 74  Weight: 122 lb (55.3 kg)  Body mass index is 22.31 kg/m.       Physical Examination:  General appearance - well appearing, and in no distress  Mental status - alert, oriented to person, place, and time  Psych:  She has a normal mood and affect  Skin - warm and dry, normal color, no suspicious lesions noted  Chest - effort normal, all lung fields clear to auscultation bilaterally  Heart - normal rate and regular  rhythm  Abdomen - soft, nontender  Extremities:  No swelling or varicosities noted  Pelvic - VULVA: normal appearing vulva with no masses, tenderness or lesions  VAGINA: normal appearing vagina with normal color and discharge, no lesions  CERVIX: normal appearing cervix without discharge or lesions, no CMT  Thin prep pap is not done   Fetal Heart Rate (bpm): +u/s via informal transabdominal u/s  No results found for this or any previous visit (from the past 24 hour(s)).  Assessment & Plan:  1) Low-Risk Pregnancy G2P1001 at [redacted]w[redacted]d with an Estimated Date of Delivery: 05/12/18   2) Initial OB visit  3) Hypothyroidism> continue synthroid daily, too early to repeat TSH today, pt wants to recheck at exactly 4wks-to come back 4/25 (4wks from starting meds) for repeat TSH  Meds: No orders of the defined types were placed in this encounter.   Initial labs obtained Continue prenatal vitamins Reviewed n/v relief measures and warning s/s to report Reviewed recommended weight gain based on pre-gravid BMI Encouraged well-balanced diet Genetic Screening discussed Integrated Screen: requested Cystic fibrosis screening discussed declined Ultrasound discussed; fetal survey: requested CCNC completed>not applying for preg mcaid, filed  Follow-up: Return in about 3 weeks (around 11/04/2017) for LROB, US:NT+1stIT, then 4/25 for lab only (TSH).   Orders Placed This Encounter  Procedures  . GC/Chlamydia Probe Amp  . Urine Culture  . US Fetal Nuchal Translucency Measurement  . Urinalysis, Routine w reflex microscopic  .  Obstetric Panel, Including HIV  . Pain Management Screening Profile (10S)  . POCT urinalysis dipstick    Cheral MarkerKimberly R Richa Shor CNM, [facility]NP[facility] 10/14/2017 5:07 PM

## 2017-10-14 NOTE — Patient Instructions (Signed)
Edwin DadaMelissa C Eskridge, I greatly value your feedback.  If you receive a survey following your visit with us today, we appreciate you taking the time to fill it out.  Thanks, Joellyn HaffKim Shuntavia Yerby, CNM, WHNP-BC   Nausea & Vomiting  Have saltine crackers or pretzels by your bed and eat a few bites before you raise your head out of bed in the morning  Eat small frequent meals throughout the day instead of large meals  Drink plenty of fluids throughout the day to stay hydrated, just don't drink a lot of fluids with your meals.  This can make your stomach fill up faster making you feel sick  Do not brush your teeth right after you eat  Products with real ginger are good for nausea, like ginger ale and ginger hard candy Make sure it says made with real ginger!  Sucking on sour candy like lemon heads is also good for nausea  If your prenatal vitamins make you nauseated, take them at night so you will sleep through the nausea  Sea Bands  If you feel like you need medicine for the nausea & vomiting please let us know  If you are unable to keep any fluids or food down please let us know   Constipation  Drink plenty of fluid, preferably water, throughout the day  Eat foods high in fiber such as fruits, vegetables, and grains  Exercise, such as walking, is a good way to keep your bowels regular  Drink warm fluids, especially warm prune juice, or decaf coffee  Eat a 1/2 cup of real oatmeal (not instant), 1/2 cup applesauce, and 1/2-1 cup warm prune juice every day  If needed, you may take Colace (docusate sodium) stool softener once or twice a day to help keep the stool soft. If you are pregnant, wait until you are out of your first trimester (12-14 weeks of pregnancy)  If you still are having problems with constipation, you may take Miralax once daily as needed to help keep your bowels regular.  If you are pregnant, wait until you are out of your first trimester (12-14 weeks of pregnancy)   First  Trimester of Pregnancy The first trimester of pregnancy is from week 1 until the end of week 12 (months 1 through 3). A week after a sperm fertilizes an egg, the egg will implant on the wall of the uterus. This embryo will begin to develop into a baby. Genes from you and your partner are forming the baby. The female genes determine whether the baby is a boy or a girl. At 6-8 weeks, the eyes and face are formed, and the heartbeat can be seen on ultrasound. At the end of 12 weeks, all the baby's organs are formed.  Now that you are pregnant, you will want to do everything you can to have a healthy baby. Two of the most important things are to get good prenatal care and to follow your health care provider's instructions. Prenatal care is all the medical care you receive before the baby's birth. This care will help prevent, find, and treat any problems during the pregnancy and childbirth. BODY CHANGES Your body goes through many changes during pregnancy. The changes vary from woman to woman.   You may gain or lose a couple of pounds at first.  You may feel sick to your stomach (nauseous) and throw up (vomit). If the vomiting is uncontrollable, call your health care provider.  You may tire easily.  You may develop headaches  that can be relieved by medicines approved by your health care provider.  You may urinate more often. Painful urination may mean you have a bladder infection.  You may develop heartburn as a result of your pregnancy.  You may develop constipation because certain hormones are causing the muscles that push waste through your intestines to slow down.  You may develop hemorrhoids or swollen, bulging veins (varicose veins).  Your breasts may begin to grow larger and become tender. Your nipples may stick out more, and the tissue that surrounds them (areola) may become darker.  Your gums may bleed and may be sensitive to brushing and flossing.  Dark spots or blotches (chloasma, mask  of pregnancy) may develop on your face. This will likely fade after the baby is born.  Your menstrual periods will stop.  You may have a loss of appetite.  You may develop cravings for certain kinds of food.  You may have changes in your emotions from day to day, such as being excited to be pregnant or being concerned that something may go wrong with the pregnancy and baby.  You may have more vivid and strange dreams.  You may have changes in your hair. These can include thickening of your hair, rapid growth, and changes in texture. Some women also have hair loss during or after pregnancy, or hair that feels dry or thin. Your hair will most likely return to normal after your baby is born. WHAT TO EXPECT AT YOUR PRENATAL VISITS During a routine prenatal visit:  You will be weighed to make sure you and the baby are growing normally.  Your blood pressure will be taken.  Your abdomen will be measured to track your baby's growth.  The fetal heartbeat will be listened to starting around week 10 or 12 of your pregnancy.  Test results from any previous visits will be discussed. Your health care provider may ask you:  How you are feeling.  If you are feeling the baby move.  If you have had any abnormal symptoms, such as leaking fluid, bleeding, severe headaches, or abdominal cramping.  If you have any questions. Other tests that may be performed during your first trimester include:  Blood tests to find your blood type and to check for the presence of any previous infections. They will also be used to check for low iron levels (anemia) and Rh antibodies. Later in the pregnancy, blood tests for diabetes will be done along with other tests if problems develop.  Urine tests to check for infections, diabetes, or protein in the urine.  An ultrasound to confirm the proper growth and development of the baby.  An amniocentesis to check for possible genetic problems.  Fetal screens for spina  bifida and Down syndrome.  You may need other tests to make sure you and the baby are doing well. HOME CARE INSTRUCTIONS  Medicines  Follow your health care provider's instructions regarding medicine use. Specific medicines may be either safe or unsafe to take during pregnancy.  Take your prenatal vitamins as directed.  If you develop constipation, try taking a stool softener if your health care provider approves. Diet  Eat regular, well-balanced meals. Choose a variety of foods, such as meat or vegetable-based protein, fish, milk and low-fat dairy products, vegetables, fruits, and whole grain breads and cereals. Your health care provider will help you determine the amount of weight gain that is right for you.  Avoid raw meat and uncooked cheese. These carry germs that can  cause birth defects in the baby.  Eating four or five small meals rather than three large meals a day may help relieve nausea and vomiting. If you start to feel nauseous, eating a few soda crackers can be helpful. Drinking liquids between meals instead of during meals also seems to help nausea and vomiting.  If you develop constipation, eat more high-fiber foods, such as fresh vegetables or fruit and whole grains. Drink enough fluids to keep your urine clear or pale yellow. Activity and Exercise  Exercise only as directed by your health care provider. Exercising will help you:  Control your weight.  Stay in shape.  Be prepared for labor and delivery.  Experiencing pain or cramping in the lower abdomen or low back is a good sign that you should stop exercising. Check with your health care provider before continuing normal exercises.  Try to avoid standing for long periods of time. Move your legs often if you must stand in one place for a long time.  Avoid heavy lifting.  Wear low-heeled shoes, and practice good posture.  You may continue to have sex unless your health care provider directs you  otherwise. Relief of Pain or Discomfort  Wear a good support bra for breast tenderness.   Take warm sitz baths to soothe any pain or discomfort caused by hemorrhoids. Use hemorrhoid cream if your health care provider approves.   Rest with your legs elevated if you have leg cramps or low back pain.  If you develop varicose veins in your legs, wear support hose. Elevate your feet for 15 minutes, 3-4 times a day. Limit salt in your diet. Prenatal Care  Schedule your prenatal visits by the twelfth week of pregnancy. They are usually scheduled monthly at first, then more often in the last 2 months before delivery.  Write down your questions. Take them to your prenatal visits.  Keep all your prenatal visits as directed by your health care provider. Safety  Wear your seat belt at all times when driving.  Make a list of emergency phone numbers, including numbers for family, friends, the hospital, and police and fire departments. General Tips  Ask your health care provider for a referral to a local prenatal education class. Begin classes no later than at the beginning of month 6 of your pregnancy.  Ask for help if you have counseling or nutritional needs during pregnancy. Your health care provider can offer advice or refer you to specialists for help with various needs.  Do not use hot tubs, steam rooms, or saunas.  Do not douche or use tampons or scented sanitary pads.  Do not cross your legs for long periods of time.  Avoid cat litter boxes and soil used by cats. These carry germs that can cause birth defects in the baby and possibly loss of the fetus by miscarriage or stillbirth.  Avoid all smoking, herbs, alcohol, and medicines not prescribed by your health care provider. Chemicals in these affect the formation and growth of the baby.  Schedule a dentist appointment. At home, brush your teeth with a soft toothbrush and be gentle when you floss. SEEK MEDICAL CARE IF:   You have  dizziness.  You have mild pelvic cramps, pelvic pressure, or nagging pain in the abdominal area.  You have persistent nausea, vomiting, or diarrhea.  You have a bad smelling vaginal discharge.  You have pain with urination.  You notice increased swelling in your face, hands, legs, or ankles. SEEK IMMEDIATE MEDICAL CARE IF:  You have a fever.  You are leaking fluid from your vagina.  You have spotting or bleeding from your vagina.  You have severe abdominal cramping or pain.  You have rapid weight gain or loss.  You vomit blood or material that looks like coffee grounds.  You are exposed to [place] measles and have never had them.  You are exposed to fifth disease or chickenpox.  You develop a severe headache.  You have shortness of breath.  You have any kind of trauma, such as from a fall or a car accident. Document Released: 06/09/2001 Document Revised: 10/30/2013 Document Reviewed: 04/25/2013 [facility] Patient Information 2015 [place], [place]. This information is not intended to replace advice given to you by your health care provider. Make sure you discuss any questions you have with your health care provider.

## 2017-10-15 LAB — OBSTETRIC PANEL, INCLUDING HIV
ANTIBODY SCREEN: NEGATIVE
BASOS: 0 %
Basophils Absolute: 0 10*3/uL (ref 0.0–0.2)
EOS (ABSOLUTE): 0.2 10*3/uL (ref 0.0–0.4)
EOS: 2 %
HEMATOCRIT: 37.3 % (ref 34.0–46.6)
HEP B S AG: NEGATIVE
HIV SCREEN 4TH GENERATION: NONREACTIVE
Hemoglobin: 12.6 g/dL (ref 11.1–15.9)
Immature Grans (Abs): 0 10*3/uL (ref 0.0–0.1)
Immature Granulocytes: 0 %
LYMPHS ABS: 3.1 10*3/uL (ref 0.7–3.1)
Lymphs: 27 %
MCH: 28.9 pg (ref 26.6–33.0)
MCHC: 33.8 g/dL (ref 31.5–35.7)
MCV: 86 fL (ref 79–97)
MONOCYTES: 6 %
Monocytes Absolute: 0.7 10*3/uL (ref 0.1–0.9)
NEUTROS ABS: 7.3 10*3/uL — AB (ref 1.4–7.0)
Neutrophils: 65 %
Platelets: 347 10*3/uL (ref 150–379)
RBC: 4.36 x10E6/uL (ref 3.77–5.28)
RDW: 14.3 % (ref 12.3–15.4)
RH TYPE: POSITIVE
RPR: NONREACTIVE
Rubella Antibodies, IGG: 1.44 index (ref >0.99)
WBC: 11.5 10*3/uL — ABNORMAL HIGH (ref 3.4–10.8)

## 2017-10-15 LAB — URINALYSIS, ROUTINE W REFLEX MICROSCOPIC
BILIRUBIN UA: NEGATIVE
Glucose, UA: NEGATIVE
Ketones, UA: NEGATIVE
Leukocytes, UA: NEGATIVE
Nitrite, UA: NEGATIVE
PH UA: 6 (ref 5.0–7.5)
Protein, UA: NEGATIVE
RBC, UA: NEGATIVE
Specific Gravity, UA: 1.007 (ref 1.005–1.030)
UUROB: 0.2 mg/dL (ref 0.2–1.0)

## 2017-10-15 LAB — PMP SCREEN PROFILE (10S), URINE
Amphetamine Scrn, Ur: NEGATIVE ng/mL
BARBITURATE SCREEN URINE: NEGATIVE ng/mL
BENZODIAZEPINE SCREEN, URINE: NEGATIVE ng/mL
CANNABINOIDS UR QL SCN: NEGATIVE ng/mL
COCAINE(METAB.)SCREEN, URINE: NEGATIVE ng/mL
Creatinine(Crt), U: 26.7 mg/dL (ref 20.0–300.0)
METHADONE SCREEN, URINE: NEGATIVE ng/mL
OXYCODONE+OXYMORPHONE UR QL SCN: NEGATIVE ng/mL
Opiate Scrn, Ur: NEGATIVE ng/mL
PROPOXYPHENE SCREEN URINE: NEGATIVE ng/mL
Ph of Urine: 5.8 (ref 4.5–8.9)
Phencyclidine Qn, Ur: NEGATIVE ng/mL

## 2017-10-16 LAB — URINE CULTURE: ORGANISM ID, BACTERIA: NO GROWTH

## 2017-10-16 LAB — GC/CHLAMYDIA PROBE AMP
Chlamydia trachomatis, NAA: NEGATIVE
Neisseria gonorrhoeae by PCR: NEGATIVE

## 2017-10-21 ENCOUNTER — Other Ambulatory Visit: Payer: BLUE CROSS/BLUE SHIELD

## 2017-10-21 DIAGNOSIS — O99281 Endocrine, nutritional and metabolic diseases complicating pregnancy, first trimester: Secondary | ICD-10-CM | POA: Diagnosis not present

## 2017-10-21 DIAGNOSIS — E039 Hypothyroidism, unspecified: Secondary | ICD-10-CM

## 2017-10-22 LAB — TSH: TSH: 2.07 u[IU]/mL (ref 0.450–4.500)

## 2017-10-27 ENCOUNTER — Encounter: Payer: Self-pay | Admitting: Women's Health

## 2017-11-01 ENCOUNTER — Telehealth: Payer: Self-pay | Admitting: Women's Health

## 2017-11-01 NOTE — Telephone Encounter (Signed)
Patient called stating that she has been having burning with urination, pt states that it is uncomfortable, Pt would like to know if we could call her something into her pharmacy

## 2017-11-02 NOTE — Telephone Encounter (Signed)
LMOVM that she could come leave a urine specimen for [place] to dip and send for culture. Advised to call to be put on tech schedule.

## 2017-11-04 ENCOUNTER — Other Ambulatory Visit: Payer: BLUE CROSS/BLUE SHIELD

## 2017-11-04 ENCOUNTER — Encounter: Payer: BLUE CROSS/BLUE SHIELD | Admitting: Women's Health

## 2017-11-08 ENCOUNTER — Ambulatory Visit (INDEPENDENT_AMBULATORY_CARE_PROVIDER_SITE_OTHER): Payer: BLUE CROSS/BLUE SHIELD | Admitting: Obstetrics and Gynecology

## 2017-11-08 ENCOUNTER — Ambulatory Visit (INDEPENDENT_AMBULATORY_CARE_PROVIDER_SITE_OTHER): Payer: BLUE CROSS/BLUE SHIELD

## 2017-11-08 VITALS — BP 102/58 | HR 76 | Wt 119.4 lb

## 2017-11-08 DIAGNOSIS — Z3A13 13 weeks gestation of pregnancy: Secondary | ICD-10-CM

## 2017-11-08 DIAGNOSIS — Z3481 Encounter for supervision of other normal pregnancy, first trimester: Secondary | ICD-10-CM

## 2017-11-08 DIAGNOSIS — Z3682 Encounter for antenatal screening for nuchal translucency: Secondary | ICD-10-CM

## 2017-11-08 DIAGNOSIS — Z331 Pregnant state, incidental: Secondary | ICD-10-CM

## 2017-11-08 DIAGNOSIS — O9989 Other specified diseases and conditions complicating pregnancy, childbirth and the puerperium: Secondary | ICD-10-CM

## 2017-11-08 DIAGNOSIS — O99281 Endocrine, nutritional and metabolic diseases complicating pregnancy, first trimester: Secondary | ICD-10-CM

## 2017-11-08 DIAGNOSIS — Z1389 Encounter for screening for other disorder: Secondary | ICD-10-CM

## 2017-11-08 DIAGNOSIS — R0981 Nasal congestion: Secondary | ICD-10-CM

## 2017-11-08 DIAGNOSIS — E039 Hypothyroidism, unspecified: Secondary | ICD-10-CM

## 2017-11-08 LAB — POCT URINALYSIS DIPSTICK
Blood, UA: NEGATIVE
GLUCOSE UA: NEGATIVE
KETONES UA: NEGATIVE
Leukocytes, UA: NEGATIVE
Nitrite, UA: NEGATIVE
Protein, UA: NEGATIVE

## 2017-11-08 NOTE — Progress Notes (Signed)
[place] 13+4 wks,measurements c/w dates,NB present,CRL 78.23 mm,NT 1.6 mm,fhr 153 bpm,posterior pl gr 0,normal ovaries bilat

## 2017-11-08 NOTE — Patient Instructions (Signed)
Second Trimester of Pregnancy The second trimester is from week 13 through week 28, month 4 through 6. This is often the time in pregnancy that you feel your best. Often times, morning sickness has lessened or quit. You may have more energy, and you may get hungry more often. Your unborn baby (fetus) is growing rapidly. At the end of the sixth month, he or she is about 9 inches long and weighs about 1 pounds. You will likely feel the baby move (quickening) between 18 and 20 weeks of pregnancy. Follow these instructions at home:  Avoid all smoking, herbs, and alcohol. Avoid drugs not approved by your doctor.  Do not use any tobacco products, including cigarettes, chewing tobacco, and electronic cigarettes. If you need help quitting, ask your doctor. You may get counseling or other support to help you quit.  Only take medicine as told by your doctor. Some medicines are safe and some are not during pregnancy.  Exercise only as told by your doctor. Stop exercising if you start having cramps.  Eat regular, healthy meals.  Wear a good support bra if your breasts are tender.  Do not use hot tubs, steam rooms, or saunas.  Wear your seat belt when driving.  Avoid raw meat, uncooked cheese, and liter boxes and soil used by cats.  Take your prenatal vitamins.  Take 1500-2000 milligrams of calcium daily starting at the 20th week of pregnancy until you deliver your baby.  Try taking medicine that helps you poop (stool softener) as needed, and if your doctor approves. Eat more fiber by eating fresh fruit, vegetables, and whole grains. Drink enough fluids to keep your pee (urine) clear or pale yellow.  Take warm water baths (sitz baths) to soothe pain or discomfort caused by hemorrhoids. Use hemorrhoid cream if your doctor approves.  If you have puffy, bulging veins (varicose veins), wear support hose. Raise (elevate) your feet for 15 minutes, 3-4 times a day. Limit salt in your diet.  Avoid heavy  lifting, wear low heals, and sit up straight.  Rest with your legs raised if you have leg cramps or low back pain.  Visit your dentist if you have not gone during your pregnancy. Use a soft toothbrush to brush your teeth. Be gentle when you floss.  You can have sex (intercourse) unless your doctor tells you not to.  Go to your doctor visits. Get help if:  You feel dizzy.  You have mild cramps or pressure in your lower belly (abdomen).  You have a nagging pain in your belly area.  You continue to feel sick to your stomach (nauseous), throw up (vomit), or have watery poop (diarrhea).  You have bad smelling fluid coming from your vagina.  You have pain with peeing (urination). Get help right away if:  You have a fever.  You are leaking fluid from your vagina.  You have spotting or bleeding from your vagina.  You have severe belly cramping or pain.  You lose or gain weight rapidly.  You have trouble catching your breath and have chest pain.  You notice sudden or extreme puffiness (swelling) of your face, hands, ankles, feet, or legs.  You have not felt the baby move in over an hour.  You have severe headaches that do not go away with medicine.  You have vision changes. This information is not intended to replace advice given to you by your health care provider. Make sure you discuss any questions you have with your health care   provider. Document Released: 09/09/2009 Document Revised: 11/21/2015 Document Reviewed: 08/16/2012 [employer] Interactive Patient Education  2017 [employer]  

## 2017-11-08 NOTE — Progress Notes (Signed)
LOW-RISK PREGNANCY VISIT Patient name: Mindy Bright MRN [id number]  Date of birth: 1980/10/09 Chief Complaint:   Routine Prenatal Visit (Integrated 1)  History of Present Illness:   Mindy Bright is a 37 y.o. G32P1001 female at [redacted]w[redacted]d with an Estimated Date of Delivery: 05/12/18 being seen today for ongoing management of a low-risk pregnancy.  Today she reports a fever on Friday, and she has had green and yellow mucous coming out of her nose. She is using a Netipot, but is still feeling pressur..  Lockie Pares. Bleeding: None.  Movement: Absent. denies leaking of fluid. Review of Systems:   Pertinent items are noted in HPI Denies abnormal vaginal discharge w/ itching/odor/irritation, headaches, visual changes, shortness of breath, chest pain, abdominal pain, severe nausea/vomiting, or problems with urination or bowel movements unless otherwise stated above. Pertinent History Reviewed:  Reviewed past medical,surgical, social, obstetrical and family history.  Reviewed problem list, medications and allergies. Physical Assessment:   Vitals:   11/08/17 1536  BP: (!) 102/58  Pulse: 76  Weight: 119 lb 6.4 oz (54.2 kg)  Body mass index is 21.84 kg/m.        Physical Examination:   General appearance: Well appearing, and in no distress  Mental status: Alert, oriented to person, place, and time  Skin: Warm & dry  Cardiovascular: Normal heart rate noted  Respiratory: Normal respiratory effort, no distress  Abdomen: Soft, gravid, nontender  Pelvic: Cervical exam deferred         Extremities: Edema: None  Fetal Status: Fetal Heart Rate (bpm): 153 +u/s   Movement: Absent    Results for orders placed or performed in visit on 11/08/17 (from the past 24 hour(s))  POCT Urinalysis Dipstick   Collection Time: 11/08/17  3:42 PM  Result Value Ref Range   Color, UA     Clarity, UA     Glucose, UA neg    Bilirubin, UA     Ketones, UA neg    Spec Grav, UA  1.010 - 1.025   Blood, UA neg    pH,  UA  5.0 - 8.0   Protein, UA neg    Urobilinogen, UA  0.2 or 1.0 E.U./dL   Nitrite, UA neg    Leukocytes, UA Negative Negative   Appearance     Odor      Assessment & Plan:  1) Low-risk pregnancy G2P1001 at [redacted]w[redacted]d with an Estimated Date of Delivery: 05/12/18   2) hypothyroidism, synthroid , will recheck TSH on 4/25  3) Nasal congestion, pt advised decongestants can be used, zyrtec/claritin, saline nasal rinse   Meds: No orders of the defined types were placed in this encounter.  Labs/procedures today: [place] 13+4 wks,measurements c/w dates,NB present,CRL 78.23 mm,NT 1.6 mm,fhr 153 bpm,posterior pl gr 0,normal ovaries bilat  Plan:  Continue routine obstetrical care  Reviewed: Preterm labor symptoms and general obstetric precautions including but not limited to vaginal bleeding, contractions, leaking of fluid and fetal movement were reviewed in detail with the patient.  All questions were answered  Follow-up: Return in about 1 month (around 12/06/2017), or if symptoms worsen or fail to improve, for LROB.  Orders Placed This Encounter  Procedures  . Integrated 1  . POCT Urinalysis Dipstick     By signing my name below, I, Izna Ahmed, attest that this documentation has been prepared under the direction and in the presence of Tilda Burrow, MD. Electronically Signed: Redge Gainer, Medical Scribe. 11/08/17. 4:14 PM.  I personally performed  the services described in this documentation, which was SCRIBED in my presence. The recorded information has been reviewed and considered accurate. It has been edited as necessary during review. Tilda Burrow, MD

## 2017-11-10 LAB — INTEGRATED 1
CROWN RUMP LENGTH MAT SCREEN: 78.2 mm
Gest. Age on Collection Date: 13.6 weeks
MATERNAL AGE AT EDD: 37.4 a
Nuchal Translucency (NT): 1.6 mm
Number of Fetuses: 1
PAPP-A Value: 2482.8 ng/mL
Weight: 123 [lb_av]

## 2017-12-07 ENCOUNTER — Ambulatory Visit (INDEPENDENT_AMBULATORY_CARE_PROVIDER_SITE_OTHER): Payer: BLUE CROSS/BLUE SHIELD | Admitting: Obstetrics & Gynecology

## 2017-12-07 ENCOUNTER — Encounter: Payer: Self-pay | Admitting: Obstetrics & Gynecology

## 2017-12-07 VITALS — BP 108/63 | HR 72 | Wt 122.5 lb

## 2017-12-07 DIAGNOSIS — Z1389 Encounter for screening for other disorder: Secondary | ICD-10-CM

## 2017-12-07 DIAGNOSIS — Z3A17 17 weeks gestation of pregnancy: Secondary | ICD-10-CM

## 2017-12-07 DIAGNOSIS — Z1379 Encounter for other screening for genetic and chromosomal anomalies: Secondary | ICD-10-CM

## 2017-12-07 DIAGNOSIS — Z331 Pregnant state, incidental: Secondary | ICD-10-CM

## 2017-12-07 DIAGNOSIS — Z3482 Encounter for supervision of other normal pregnancy, second trimester: Secondary | ICD-10-CM

## 2017-12-07 LAB — POCT URINALYSIS DIPSTICK
Blood, UA: NEGATIVE
GLUCOSE UA: NEGATIVE
KETONES UA: NEGATIVE
LEUKOCYTES UA: NEGATIVE
NITRITE UA: NEGATIVE
PROTEIN UA: NEGATIVE

## 2017-12-07 NOTE — Progress Notes (Signed)
G2P1001 6931w5d Estimated Date of Delivery: 05/12/18  Blood pressure 108/63, pulse 72, weight 122 lb 8 oz (55.6 kg), last menstrual period 08/05/2017, currently breastfeeding.   BP weight and urine results all reviewed and noted.  Please refer to the obstetrical flow sheet for the fundal height and fetal heart rate documentation:  Patient reports good fetal movement, denies any bleeding and no rupture of membranes symptoms or regular contractions. Patient is without complaints. All questions were answered.  Orders Placed This Encounter  Procedures  . US OB Comp + 14 Wk  . INTEGRATED 2  . POCT Urinalysis Dipstick    Plan:  Continued routine obstetrical care, 2nd IT today  Return in about 2 weeks (around 12/21/2017) for 20 week sono, LROB.

## 2017-12-09 LAB — INTEGRATED 2
ADSF: 1.57
AFP MOM: 1.39
Alpha-Fetoprotein: 59.9 ng/mL
Crown Rump Length: 78.2 mm
DIA MoM: 1.67
DIA VALUE: 324.4 pg/mL
ESTRIOL UNCONJUGATED: 1.94 ng/mL
GEST. AGE ON COLLECTION DATE: 13.6 wk
Gestational Age: 17.7 weeks
HCG MOM: 2.1
HCG VALUE: 62.7 [IU]/mL
MATERNAL AGE AT EDD: 37.4 a
NUCHAL TRANSLUCENCY (NT): 1.6 mm
NUMBER OF FETUSES: 1
Nuchal Translucency MoM: 0.87
PAPP-A MoM: 1.51
PAPP-A Value: 2482.8 ng/mL
Test Results:: NEGATIVE
WEIGHT: 123 [lb_av]
WEIGHT: 123 [lb_av]

## 2017-12-21 ENCOUNTER — Other Ambulatory Visit: Payer: Self-pay | Admitting: Obstetrics & Gynecology

## 2017-12-21 ENCOUNTER — Telehealth: Payer: Self-pay | Admitting: Obstetrics & Gynecology

## 2017-12-21 DIAGNOSIS — Z363 Encounter for antenatal screening for malformations: Secondary | ICD-10-CM

## 2017-12-21 DIAGNOSIS — Z3482 Encounter for supervision of other normal pregnancy, second trimester: Secondary | ICD-10-CM

## 2017-12-21 NOTE — Telephone Encounter (Signed)
Pt called stating that she is having a green/yellow discharge with a fishy odor. States that baby is moving well. No c/o bleeding or contractions. Advised that she should be evaluated by a provider. Pt asks if this can be addressed at her appt on Thursday. Advised that would be ok. Advised to call back if she experienced any pelvic pain with the d/c tomorrow.

## 2017-12-23 ENCOUNTER — Ambulatory Visit (INDEPENDENT_AMBULATORY_CARE_PROVIDER_SITE_OTHER): Payer: BLUE CROSS/BLUE SHIELD

## 2017-12-23 ENCOUNTER — Ambulatory Visit (INDEPENDENT_AMBULATORY_CARE_PROVIDER_SITE_OTHER): Payer: BLUE CROSS/BLUE SHIELD | Admitting: Obstetrics and Gynecology

## 2017-12-23 VITALS — BP 105/67 | HR 69 | Wt 123.4 lb

## 2017-12-23 DIAGNOSIS — Z3482 Encounter for supervision of other normal pregnancy, second trimester: Secondary | ICD-10-CM

## 2017-12-23 DIAGNOSIS — Z1389 Encounter for screening for other disorder: Secondary | ICD-10-CM

## 2017-12-23 DIAGNOSIS — Z3A20 20 weeks gestation of pregnancy: Secondary | ICD-10-CM

## 2017-12-23 DIAGNOSIS — O23592 Infection of other part of genital tract in pregnancy, second trimester: Secondary | ICD-10-CM

## 2017-12-23 DIAGNOSIS — B9689 Other specified bacterial agents as the cause of diseases classified elsewhere: Secondary | ICD-10-CM | POA: Diagnosis not present

## 2017-12-23 DIAGNOSIS — Z331 Pregnant state, incidental: Secondary | ICD-10-CM

## 2017-12-23 DIAGNOSIS — Z363 Encounter for antenatal screening for malformations: Secondary | ICD-10-CM

## 2017-12-23 LAB — POCT URINALYSIS DIPSTICK
GLUCOSE UA: NEGATIVE
KETONES UA: NEGATIVE
Leukocytes, UA: NEGATIVE
Nitrite, UA: NEGATIVE
Protein, UA: NEGATIVE
RBC UA: NEGATIVE

## 2017-12-23 MED ORDER — METRONIDAZOLE 500 MG PO TABS: 500.0000 mg | ORAL_TABLET | Freq: Two times a day (BID) | ORAL | 1 refills | Status: DC

## 2017-12-23 NOTE — Progress Notes (Signed)
US 20 wks,breech,svp of fluid 4.6 cm,normal ovaries bilat,fhr 138 bpm,cx 4.3 cm,EFW 335 g 54%,anatomy complete,no obvious abnormalities

## 2017-12-23 NOTE — Progress Notes (Signed)
[id number]  [facility]  Chief Complaint  Patient presents with  . Routine Prenatal Visit    US  ____normal long cervix with no funneling. Patient complaints:whitish d/c some yellow last week no yellow now no itching. Patient reports   good fetal movement,                           denies any bleeding , rupture of membranes,or regular contractions.  Blood pressure 105/67, pulse 69, weight 123 lb 6.4 oz (56 kg), last menstrual period 08/05/2017, currently breastfeeding.   Urine results:notable for neg protein n evidence uti refer to the ob flow sheet for FH and FHR, ,                          Physical Examination: General appearance - alert, well appearing, and in no distress, oriented to person, place, and time and normal appearing weight                                      Abdomen - FH20 ,                                                         -FHr 150                                                         soft, nontender, nondistended, no masses or organomegaly                                      Pelvic -  Wet prep: + wbc, moderate clue                                                    KOH neg for yeast                                           Cervix is long closed visually, unable to insert speculum but a short distance due to low cervix. Questions were answered. Assessment: LROB G2P1001 @ 6538w0d  BV  Plan:  Continued routine obstetrical care, tx for BV  F/u in as scheduled  weeks for lrob

## 2018-01-18 ENCOUNTER — Encounter: Payer: BLUE CROSS/BLUE SHIELD | Admitting: Obstetrics & Gynecology

## 2018-01-18 ENCOUNTER — Ambulatory Visit (INDEPENDENT_AMBULATORY_CARE_PROVIDER_SITE_OTHER): Payer: BLUE CROSS/BLUE SHIELD | Admitting: Obstetrics & Gynecology

## 2018-01-18 ENCOUNTER — Encounter: Payer: Self-pay | Admitting: Obstetrics & Gynecology

## 2018-01-18 ENCOUNTER — Other Ambulatory Visit: Payer: Self-pay

## 2018-01-18 VITALS — BP 104/62 | HR 88 | Wt 128.0 lb

## 2018-01-18 DIAGNOSIS — Z1389 Encounter for screening for other disorder: Secondary | ICD-10-CM

## 2018-01-18 DIAGNOSIS — Z3A23 23 weeks gestation of pregnancy: Secondary | ICD-10-CM

## 2018-01-18 DIAGNOSIS — Z3482 Encounter for supervision of other normal pregnancy, second trimester: Secondary | ICD-10-CM

## 2018-01-18 DIAGNOSIS — Z331 Pregnant state, incidental: Secondary | ICD-10-CM

## 2018-01-18 LAB — POCT URINALYSIS DIPSTICK OB
Blood, UA: NEGATIVE
Glucose, UA: NEGATIVE — AB
Ketones, UA: NEGATIVE
Leukocytes, UA: NEGATIVE
Nitrite, UA: NEGATIVE
POC,PROTEIN,UA: NEGATIVE

## 2018-01-18 NOTE — Progress Notes (Signed)
G2P1001 2852w5d Estimated Date of Delivery: 05/12/18  Blood pressure 104/62, pulse 88, weight 128 lb (58.1 kg), last menstrual period 08/05/2017, currently breastfeeding.   BP weight and urine results all reviewed and noted.  Please refer to the obstetrical flow sheet for the fundal height and fetal heart rate documentation:  Patient reports good fetal movement, denies any bleeding and no rupture of membranes symptoms or regular contractions. Patient is without complaints. All questions were answered.  Orders Placed This Encounter  Procedures  . POC Urinalysis Dipstick OB    Plan:  Continued routine obstetrical care,   Return in about 1 month (around 02/15/2018) for PN2 + TSH, , LROB.

## 2018-01-24 ENCOUNTER — Telehealth: Payer: Self-pay | Admitting: Women's Health

## 2018-01-24 NOTE — Telephone Encounter (Signed)
Pt called asking what she could take for congestion. Advised that she could take sudafed, mucinex, saline nasal spray, guaifenesin. Advised to call back if these did not help. Pt verbalized understanding.

## 2018-01-24 NOTE — Telephone Encounter (Signed)
Pt is 23 weeks wants to know what she can take out of the counter for drainage/ congestion/ no fever/ feels fine other than that. Please call and advise

## 2018-02-01 ENCOUNTER — Telehealth: Payer: Self-pay | Admitting: *Deleted

## 2018-02-01 NOTE — Telephone Encounter (Signed)
Pt called stating that she has had congestion for 2 weeks and her right ear has been stopped up. She states that with taking decongestants, saline spray, and humidifier the congestion is getting better but her ear continues to be stopped up and is having a dull pain. Advised that she could make an appt to have it looked at by a provider. No c/ vaginal bleeding, leaking or contractions. Baby is moving well.

## 2018-02-03 ENCOUNTER — Ambulatory Visit (INDEPENDENT_AMBULATORY_CARE_PROVIDER_SITE_OTHER): Payer: BLUE CROSS/BLUE SHIELD | Admitting: Women's Health

## 2018-02-03 ENCOUNTER — Encounter: Payer: Self-pay | Admitting: Women's Health

## 2018-02-03 VITALS — BP 114/65 | HR 82 | Temp 98.5°F | Wt 129.5 lb

## 2018-02-03 DIAGNOSIS — Z1389 Encounter for screening for other disorder: Secondary | ICD-10-CM

## 2018-02-03 DIAGNOSIS — Z3482 Encounter for supervision of other normal pregnancy, second trimester: Secondary | ICD-10-CM

## 2018-02-03 DIAGNOSIS — Z331 Pregnant state, incidental: Secondary | ICD-10-CM

## 2018-02-03 DIAGNOSIS — H6591 Unspecified nonsuppurative otitis media, right ear: Secondary | ICD-10-CM

## 2018-02-03 DIAGNOSIS — Z3A26 26 weeks gestation of pregnancy: Secondary | ICD-10-CM

## 2018-02-03 DIAGNOSIS — O9989 Other specified diseases and conditions complicating pregnancy, childbirth and the puerperium: Secondary | ICD-10-CM

## 2018-02-03 LAB — POCT URINALYSIS DIPSTICK OB
GLUCOSE, UA: NEGATIVE — AB
Ketones, UA: NEGATIVE
LEUKOCYTES UA: NEGATIVE
Nitrite, UA: NEGATIVE
PROTEIN: NEGATIVE
RBC UA: NEGATIVE

## 2018-02-03 NOTE — Progress Notes (Signed)
   GYN VISIT Patient name: Mindy Bright MRN [id number]  Date of birth: [birth year] Chief Complaint:   Routine Prenatal Visit (right ear stopped up; dizzy spells)  History of Present Illness:   Mindy DadaMelissa C Imm is a 37 y.o. 892P1001 Caucasian female at    GYN VISIT Patient name: Mindy Bright MRN [id number]  Date of birth: [birth year] Chief Complaint:   Routine Prenatal Visit (right ear stopped up; dizzy spells)  History of Present Illness:   Mindy Bright is a 37 y.o. 502P1001 Caucasian female at 3422w0d being seen today for report of right ear being stopped up x 2.5wks, popping, and dizziness. Denies fever/chills, ear pain, etc. Uses netty pot nightly, just makes it pop, hurts slightly after. Has tried alkaseltzer w/o relief. Good fm. No vb or uc's.     Patient's last menstrual period was 08/05/2017 (exact date). Review of Systems:   Pertinent items are noted in HPI Denies fever/chills, dizziness, headaches, visual disturbances, fatigue, shortness of breath, chest pain, abdominal pain, vomiting, abnormal vaginal discharge/itching/odor/irritation, problems with periods, bowel movements, urination, or intercourse unless otherwise stated above.  Pertinent History Reviewed:  Reviewed past medical,surgical, social, obstetrical and family history.  Reviewed problem list, medications and allergies. Physical Assessment:   Vitals:   02/03/18 0852  BP: 114/65  Pulse: 82  Temp: 98.5 F (36.9 C)  Weight: 129 lb 8 oz (58.7 kg)  Body mass index is 23.69 kg/m.       Physical Examination:   General appearance: alert, well appearing, and in no distress  Mental status: alert, oriented to person, place, and time  Skin: warm & dry   Ears: bilateral TMs clear, no erythema, +fluid Rt ear  Cardiovascular: normal heart rate noted  Respiratory: normal respiratory effort, no distress  Abdomen: soft, non-tender   Pelvic: normal external genitalia, vulva, vagina, cervix, uterus and adnexa,  examination not indicated  Extremities: no edema   Results for orders placed or performed in visit on 02/03/18 (from the past 24 hour(s))  POC Urinalysis Dipstick OB   Collection Time: 02/03/18  8:53 AM  Result Value Ref Range   Color, UA     Clarity, UA     Glucose, UA Negative (A) (none)   Bilirubin, UA     Ketones, UA neg    Spec Grav, UA     Blood, UA neg    pH, UA     POC Protein UA Negative Negative, Trace   Urobilinogen, UA     Nitrite, UA neg    Leukocytes, UA Negative Negative   Appearance     Odor      Assessment & Plan:  1) Fluid behind TM Rt ear> no signs of infection, discussed w/ JVF, to try Otrivine nasal spray BID, while laying on bed w/ head turned to Rt. Also otc decongestant w/ neosynephrine. Let us know if not improving.  2) 422w0d pregnant  Meds: No orders of the defined types were placed in this encounter.   Orders Placed This Encounter  Procedures  . POC Urinalysis Dipstick OB    Return for As scheduled.  Cheral MarkerKimberly R Mikell Camp CNM, [facility]NP-BC 02/03/2018 9:29 AM

## 2018-02-03 NOTE — Patient Instructions (Addendum)
Otrivine nasal spray twice daily. Lay on bed, turn head to right and spray into right nostril, lay there for about 5 minutes  Over the counter decongestant w/ neosyenphrine  Edwin Dada, I greatly value your feedback.  If you receive a survey following your visit with [place] today, we appreciate you taking the time to fill it out.  Thanks, [name], CNM, WHNP-BC   You will have your sugar test next visit.  Please do not eat or drink anything after midnight the night before you come, not even water.  You will be here for at least two hours.     Call the office [phone]) or go to [facility] if:  You begin to have strong, frequent contractions  Your water breaks.  Sometimes it is a big gush of fluid, sometimes it is just a trickle that keeps getting your panties wet or running down your legs  You have vaginal bleeding.  It is normal to have a small amount of spotting if your cervix was checked.   You don't feel your baby moving like normal.  If you don't, get you something to eat and drink and lay down and focus on feeling your baby move.   If your baby is still not moving like normal, you should call the office or go to [facility].  Second Trimester of Pregnancy The second trimester is from week 13 through week 28, months 4 through 6. The second trimester is often a time when you feel your best. Your body has also adjusted to being pregnant, and you begin to feel better physically. Usually, morning sickness has lessened or quit completely, you may have more energy, and you may have an increase in appetite. The second trimester is also a time when the fetus is growing rapidly. At the end of the sixth month, the fetus is about 9 inches long and weighs about 1 pounds. You will likely begin to feel the baby move (quickening) between 18 and 20 weeks of the pregnancy. BODY CHANGES Your body goes through many changes during pregnancy. The changes vary from woman to woman.   Your  weight will continue to increase. You will notice your lower abdomen bulging out.  You may begin to get stretch marks on your hips, abdomen, and breasts.  You may develop headaches that can be relieved by medicines approved by your health care provider.  You may urinate more often because the fetus is pressing on your bladder.  You may develop or continue to have heartburn as a result of your pregnancy.  You may develop constipation because certain hormones are causing the muscles that push waste through your intestines to slow down.  You may develop hemorrhoids or swollen, bulging veins (varicose veins).  You may have back pain because of the weight gain and pregnancy hormones relaxing your joints between the bones in your pelvis and as a result of a shift in weight and the muscles that support your balance.  Your breasts will continue to grow and be tender.  Your gums may bleed and may be sensitive to brushing and flossing.  Dark spots or blotches (chloasma, mask of pregnancy) may develop on your face. This will likely fade after the baby is born.  A dark line from your belly button to the pubic area (linea nigra) may appear. This will likely fade after the baby is born.  You may have changes in your hair. These can include thickening of your hair, rapid growth,  and changes in texture. Some women also have hair loss during or after pregnancy, or hair that feels dry or thin. Your hair will most likely return to normal after your baby is born. WHAT TO EXPECT AT YOUR PRENATAL VISITS During a routine prenatal visit:  You will be weighed to make sure you and the fetus are growing normally.  Your blood pressure will be taken.  Your abdomen will be measured to track your baby's growth.  The fetal heartbeat will be listened to.  Any test results from the previous visit will be discussed. Your health care provider may ask you:  How you are feeling.  If you are feeling the baby  move.  If you have had any abnormal symptoms, such as leaking fluid, bleeding, severe headaches, or abdominal cramping.  If you have any questions. Other tests that may be performed during your second trimester include:  Blood tests that check for:  Low iron levels (anemia).  Gestational diabetes (between 24 and 28 weeks).  Rh antibodies.  Urine tests to check for infections, diabetes, or protein in the urine.  An ultrasound to confirm the proper growth and development of the baby.  An amniocentesis to check for possible genetic problems.  Fetal screens for spina bifida and Down syndrome. HOME CARE INSTRUCTIONS   Avoid all smoking, herbs, alcohol, and unprescribed drugs. These chemicals affect the formation and growth of the baby.  Follow your health care provider's instructions regarding medicine use. There are medicines that are either safe or unsafe to take during pregnancy.  Exercise only as directed by your health care provider. Experiencing uterine cramps is a good sign to stop exercising.  Continue to eat regular, healthy meals.  Wear a good support bra for breast tenderness.  Do not use hot tubs, steam rooms, or saunas.  Wear your seat belt at all times when driving.  Avoid raw meat, uncooked cheese, cat litter boxes, and soil used by cats. These carry germs that can cause birth defects in the baby.  Take your prenatal vitamins.  Try taking a stool softener (if your health care provider approves) if you develop constipation. Eat more high-fiber foods, such as fresh vegetables or fruit and whole grains. Drink plenty of fluids to keep your urine clear or pale yellow.  Take warm sitz baths to soothe any pain or discomfort caused by hemorrhoids. Use hemorrhoid cream if your health care provider approves.  If you develop varicose veins, wear support hose. Elevate your feet for 15 minutes, 3-4 times a day. Limit salt in your diet.  Avoid heavy lifting, wear low heel  shoes, and practice good posture.  Rest with your legs elevated if you have leg cramps or low back pain.  Visit your dentist if you have not gone yet during your pregnancy. Use a soft toothbrush to brush your teeth and be gentle when you floss.  A sexual relationship may be continued unless your health care provider directs you otherwise.  Continue to go to all your prenatal visits as directed by your health care provider. SEEK MEDICAL CARE IF:   You have dizziness.  You have mild pelvic cramps, pelvic pressure, or nagging pain in the abdominal area.  You have persistent nausea, vomiting, or diarrhea.  You have a bad smelling vaginal discharge.  You have pain with urination. SEEK IMMEDIATE MEDICAL CARE IF:   You have a fever.  You are leaking fluid from your vagina.  You have spotting or bleeding from your vagina.  You have severe abdominal cramping or pain.  You have rapid weight gain or loss.  You have shortness of breath with chest pain.  You notice sudden or extreme swelling of your face, hands, ankles, feet, or legs.  You have not felt your baby move in over an hour.  You have severe headaches that do not go away with medicine.  You have vision changes. Document Released: 06/09/2001 Document Revised: 06/20/2013 Document Reviewed: 08/16/2012 [facility] Patient Information 2015 [place], [place]. This information is not intended to replace advice given to you by your health care provider. Make sure you discuss any questions you have with your health care provider.

## 2018-02-15 ENCOUNTER — Encounter: Payer: BLUE CROSS/BLUE SHIELD | Admitting: Obstetrics & Gynecology

## 2018-02-15 ENCOUNTER — Other Ambulatory Visit: Payer: BLUE CROSS/BLUE SHIELD

## 2018-02-17 ENCOUNTER — Other Ambulatory Visit: Payer: BLUE CROSS/BLUE SHIELD

## 2018-02-17 ENCOUNTER — Ambulatory Visit (INDEPENDENT_AMBULATORY_CARE_PROVIDER_SITE_OTHER): Payer: BLUE CROSS/BLUE SHIELD | Admitting: Obstetrics & Gynecology

## 2018-02-17 ENCOUNTER — Encounter: Payer: Self-pay | Admitting: Obstetrics & Gynecology

## 2018-02-17 VITALS — BP 106/68 | HR 78 | Wt 130.0 lb

## 2018-02-17 DIAGNOSIS — Z3483 Encounter for supervision of other normal pregnancy, third trimester: Secondary | ICD-10-CM

## 2018-02-17 DIAGNOSIS — Z3482 Encounter for supervision of other normal pregnancy, second trimester: Secondary | ICD-10-CM

## 2018-02-17 DIAGNOSIS — Z131 Encounter for screening for diabetes mellitus: Secondary | ICD-10-CM

## 2018-02-17 DIAGNOSIS — E039 Hypothyroidism, unspecified: Secondary | ICD-10-CM | POA: Diagnosis not present

## 2018-02-17 DIAGNOSIS — Z3A28 28 weeks gestation of pregnancy: Secondary | ICD-10-CM

## 2018-02-17 DIAGNOSIS — Z1389 Encounter for screening for other disorder: Secondary | ICD-10-CM

## 2018-02-17 DIAGNOSIS — Z331 Pregnant state, incidental: Secondary | ICD-10-CM

## 2018-02-17 LAB — POCT URINALYSIS DIPSTICK OB
Blood, UA: NEGATIVE
Glucose, UA: NEGATIVE — AB
Ketones, UA: NEGATIVE
Leukocytes, UA: NEGATIVE
Nitrite, UA: NEGATIVE
POC,PROTEIN,UA: NEGATIVE

## 2018-02-17 MED ORDER — FLUCONAZOLE 150 MG PO TABS: 150.0000 mg | ORAL_TABLET | Freq: Once | ORAL | 1 refills | Status: AC

## 2018-02-17 NOTE — Progress Notes (Signed)
G2P1001 3321w0d Estimated Date of Delivery: 05/12/18  Blood pressure 106/68, pulse 78, weight 130 lb (59 kg), last menstrual period 08/05/2017, currently breastfeeding.   BP weight and urine results all reviewed and noted.  Please refer to the obstetrical flow sheet for the fundal height and fetal heart rate documentation:  Patient reports good fetal movement, denies any bleeding and no rupture of membranes symptoms or regular contractions. Patient is without complaints. All questions were answered.  Orders Placed This Encounter  Procedures  . POC Urinalysis Dipstick OB    Plan:  Continued routine obstetrical care,  +yeast:  Meds ordered this encounter  Medications  . fluconazole (DIFLUCAN) 150 MG tablet    Sig: Take 1 tablet (150 mg total) by mouth once for 1 dose. Take the second tablet 3 days after the first one.    Dispense:  2 tablet    Refill:  1     Return in about 3 weeks (around 03/10/2018) for LROB.

## 2018-02-18 LAB — CBC
HEMOGLOBIN: 10.8 g/dL — AB (ref 11.1–15.9)
Hematocrit: 32.1 % — ABNORMAL LOW (ref 34.0–46.6)
MCH: 29.5 pg (ref 26.6–33.0)
MCHC: 33.6 g/dL (ref 31.5–35.7)
MCV: 88 fL (ref 79–97)
Platelets: 274 10*3/uL (ref 150–450)
RBC: 3.66 x10E6/uL — ABNORMAL LOW (ref 3.77–5.28)
RDW: 13.4 % (ref 12.3–15.4)
WBC: 13.2 10*3/uL — ABNORMAL HIGH (ref 3.4–10.8)

## 2018-02-18 LAB — GLUCOSE TOLERANCE, 2 HOURS W/ 1HR
GLUCOSE, 1 HOUR: 117 mg/dL (ref 65–179)
Glucose, 2 hour: 106 mg/dL (ref 65–152)
Glucose, Fasting: 66 mg/dL (ref 65–91)

## 2018-02-18 LAB — ANTIBODY SCREEN: ANTIBODY SCREEN: NEGATIVE

## 2018-02-18 LAB — RPR: RPR: NONREACTIVE

## 2018-02-18 LAB — TSH: TSH: 3.62 u[IU]/mL (ref 0.450–4.500)

## 2018-02-18 LAB — HIV ANTIBODY (ROUTINE TESTING W REFLEX): HIV Screen 4th Generation wRfx: NONREACTIVE

## 2018-02-21 ENCOUNTER — Other Ambulatory Visit: Payer: Self-pay | Admitting: Women's Health

## 2018-02-21 MED ORDER — FERROUS SULFATE 325 (65 FE) MG PO TABS: 325.0000 mg | ORAL_TABLET | Freq: Two times a day (BID) | ORAL | 3 refills | Status: DC

## 2018-03-10 ENCOUNTER — Encounter: Payer: BLUE CROSS/BLUE SHIELD | Admitting: Women's Health

## 2018-03-11 ENCOUNTER — Other Ambulatory Visit: Payer: Self-pay

## 2018-03-11 ENCOUNTER — Ambulatory Visit (INDEPENDENT_AMBULATORY_CARE_PROVIDER_SITE_OTHER): Payer: BLUE CROSS/BLUE SHIELD | Admitting: Women's Health

## 2018-03-11 ENCOUNTER — Encounter: Payer: Self-pay | Admitting: Women's Health

## 2018-03-11 VITALS — BP 125/65 | HR 80 | Wt 138.0 lb

## 2018-03-11 DIAGNOSIS — Z1389 Encounter for screening for other disorder: Secondary | ICD-10-CM

## 2018-03-11 DIAGNOSIS — Z3A31 31 weeks gestation of pregnancy: Secondary | ICD-10-CM

## 2018-03-11 DIAGNOSIS — Z3483 Encounter for supervision of other normal pregnancy, third trimester: Secondary | ICD-10-CM

## 2018-03-11 DIAGNOSIS — Z331 Pregnant state, incidental: Secondary | ICD-10-CM

## 2018-03-11 LAB — POCT URINALYSIS DIPSTICK OB
Blood, UA: NEGATIVE
GLUCOSE, UA: NEGATIVE
Ketones, UA: NEGATIVE
LEUKOCYTES UA: NEGATIVE
NITRITE UA: NEGATIVE
PROTEIN: NEGATIVE

## 2018-03-11 NOTE — Patient Instructions (Addendum)
Mindy DadaMelissa C Bright, I greatly value your feedback.  If you receive a survey following your visit with us today, we appreciate you taking the time to fill it out.  Thanks, Mindy HaffKim Rowena Bright, CNM, WHNP-BC   Call the office [phone]) or go to [facility] if:  You begin to have strong, frequent contractions  Your water breaks.  Sometimes it is a big gush of fluid, sometimes it is just a trickle that keeps getting your panties wet or running down your legs  You have vaginal bleeding.  It is normal to have a small amount of spotting if your cervix was checked.   You don't feel your baby moving like normal.  If you don't, get you something to eat and drink and lay down and focus on feeling your baby move.  You should feel at least 10 movements in 2 hours.  If you don't, you should call the office or go to [facility].    Tdap Vaccine  It is recommended that you get the Tdap vaccine during the third trimester of EACH pregnancy to help protect your baby from getting pertussis (whooping cough)  27-36 weeks is the BEST time to do this so that you can pass the protection on to your baby. During pregnancy is better than after pregnancy, but if you are unable to get it during pregnancy it will be offered at the hospital.   You can get this vaccine with us, at the health department, your family doctor, or some local pharmacies  Everyone who will be around your baby should also be up-to-date on their vaccines before the baby comes. Adults (who are not pregnant) only need 1 dose of Tdap during adulthood.   Tips to Help Leg Cramps  Increase dietary sources of calcium (milk, yogurt, cheese, leafy greens, seafood, legumes, and fruit) and magnesium (dark leafy greens, nuts, seeds, fish, beans, whole grains, avocados, yogurt, bananas, dried fruit, dark chocolate)  Spoonful of regular yellow mustard every night  Pickle juice  Magnesium supplement: 5mmol in the morning, 10mmol at night (can find in the  vitamin aisle)  Dorsiflexion of foot: pointing your toes back towards your knee during the cramp     For your lower back pain you may:  Purchase a pregnancy belt from [employer], [employer], [employer], etc and wear it while you are up and about  Take warm baths  Use a heating pad to your lower back for no longer than 20 minutes at a time, and do not place near abdomen  Take tylenol as needed. Please follow directions on the bottle  Kinesiology tape (can get from sporting goods store), google how to tape belly for pregnancy    Third Trimester of Pregnancy The third trimester is from week 29 through week 42, months 7 through 9. The third trimester is a time when the fetus is growing rapidly. At the end of the ninth month, the fetus is about 20 inches in length and weighs 6-10 pounds.  BODY CHANGES Your body goes through many changes during pregnancy. The changes vary from woman to woman.   Your weight will continue to increase. You can expect to gain 25-35 pounds (11-16 kg) by the end of the pregnancy.  You may begin to get stretch marks on your hips, abdomen, and breasts.  You may urinate more often because the fetus is moving lower into your pelvis and pressing on your bladder.  You may develop or continue to have heartburn as a result of  your pregnancy.  You may develop constipation because certain hormones are causing the muscles that push waste through your intestines to slow down.  You may develop hemorrhoids or swollen, bulging veins (varicose veins).  You may have pelvic pain because of the weight gain and pregnancy hormones relaxing your joints between the bones in your pelvis. Backaches may result from overexertion of the muscles supporting your posture.  You may have changes in your hair. These can include thickening of your hair, rapid growth, and changes in texture. Some women also have hair loss during or after pregnancy, or hair that feels dry or thin. Your hair  will most likely return to normal after your baby is born.  Your breasts will continue to grow and be tender. A yellow discharge may leak from your breasts called colostrum.  Your belly button may stick out.  You may feel short of breath because of your expanding uterus.  You may notice the fetus "dropping," or moving lower in your abdomen.  You may have a bloody mucus discharge. This usually occurs a few days to a week before labor begins.  Your cervix becomes thin and soft (effaced) near your due date. WHAT TO EXPECT AT YOUR PRENATAL EXAMS  You will have prenatal exams every 2 weeks until week 36. Then, you will have weekly prenatal exams. During a routine prenatal visit:  You will be weighed to make sure you and the fetus are growing normally.  Your blood pressure is taken.  Your abdomen will be measured to track your baby's growth.  The fetal heartbeat will be listened to.  Any test results from the previous visit will be discussed.  You may have a cervical check near your due date to see if you have effaced. At around 36 weeks, your caregiver will check your cervix. At the same time, your caregiver will also perform a test on the secretions of the vaginal tissue. This test is to determine if a type of bacteria, Group B streptococcus, is present. Your caregiver will explain this further. Your caregiver may ask you:  What your birth plan is.  How you are feeling.  If you are feeling the baby move.  If you have had any abnormal symptoms, such as leaking fluid, bleeding, severe headaches, or abdominal cramping.  If you have any questions. Other tests or screenings that may be performed during your third trimester include:  Blood tests that check for low iron levels (anemia).  Fetal testing to check the health, activity level, and growth of the fetus. Testing is done if you have certain medical conditions or if there are problems during the pregnancy. FALSE LABOR You may  feel small, irregular contractions that eventually go away. These are called Braxton Hicks contractions, or false labor. Contractions may last for hours, days, or even weeks before true labor sets in. If contractions come at regular intervals, intensify, or become painful, it is best to be seen by your caregiver.  SIGNS OF LABOR   Menstrual-like cramps.  Contractions that are 5 minutes apart or less.  Contractions that start on the top of the uterus and spread down to the lower abdomen and back.  A sense of increased pelvic pressure or back pain.  A watery or bloody mucus discharge that comes from the vagina. If you have any of these signs before the 37th week of pregnancy, call your caregiver right away. You need to go to the hospital to get checked immediately. HOME CARE INSTRUCTIONS  Avoid all smoking, herbs, alcohol, and unprescribed drugs. These chemicals affect the formation and growth of the baby.  Follow your caregiver's instructions regarding medicine use. There are medicines that are either safe or unsafe to take during pregnancy.  Exercise only as directed by your caregiver. Experiencing uterine cramps is a good sign to stop exercising.  Continue to eat regular, healthy meals.  Wear a good support bra for breast tenderness.  Do not use hot tubs, steam rooms, or saunas.  Wear your seat belt at all times when driving.  Avoid raw meat, uncooked cheese, cat litter boxes, and soil used by cats. These carry germs that can cause birth defects in the baby.  Take your prenatal vitamins.  Try taking a stool softener (if your caregiver approves) if you develop constipation. Eat more high-fiber foods, such as fresh vegetables or fruit and whole grains. Drink plenty of fluids to keep your urine clear or pale yellow.  Take warm sitz baths to soothe any pain or discomfort caused by hemorrhoids. Use hemorrhoid cream if your caregiver approves.  If you develop varicose veins, wear  support hose. Elevate your feet for 15 minutes, 3-4 times a day. Limit salt in your diet.  Avoid heavy lifting, wear low heal shoes, and practice good posture.  Rest a lot with your legs elevated if you have leg cramps or low back pain.  Visit your dentist if you have not gone during your pregnancy. Use a soft toothbrush to brush your teeth and be gentle when you floss.  A sexual relationship may be continued unless your caregiver directs you otherwise.  Do not travel far distances unless it is absolutely necessary and only with the approval of your caregiver.  Take prenatal classes to understand, practice, and ask questions about the labor and delivery.  Make a trial run to the hospital.  Pack your hospital bag.  Prepare the baby's nursery.  Continue to go to all your prenatal visits as directed by your caregiver. SEEK MEDICAL CARE IF:  You are unsure if you are in labor or if your water has broken.  You have dizziness.  You have mild pelvic cramps, pelvic pressure, or nagging pain in your abdominal area.  You have persistent nausea, vomiting, or diarrhea.  You have a bad smelling vaginal discharge.  You have pain with urination. SEEK IMMEDIATE MEDICAL CARE IF:   You have a fever.  You are leaking fluid from your vagina.  You have spotting or bleeding from your vagina.  You have severe abdominal cramping or pain.  You have rapid weight loss or gain.  You have shortness of breath with chest pain.  You notice sudden or extreme swelling of your face, hands, ankles, feet, or legs.  You have not felt your baby move in over an hour.  You have severe headaches that do not go away with medicine.  You have vision changes. Document Released: 06/09/2001 Document Revised: 06/20/2013 Document Reviewed: 08/16/2012 [facility] Patient Information 2015 [place], [place]. This information is not intended to replace advice given to you by your health care provider. Make sure you  discuss any questions you have with your health care provider.  PROTECT YOURSELF & YOUR BABY FROM THE FLU! Because you are pregnant, we at [facility], along with the Centers for Disease Control (CDC), recommend that you receive the flu vaccine to protect yourself and your baby from the flu. The flu is more likely to cause severe illness in pregnant women than in women  of reproductive age who are not pregnant. Changes in the immune system, heart, and lungs during pregnancy make pregnant women (and women up to two weeks postpartum) more prone to severe illness from flu, including illness resulting in hospitalization. Flu also may be harmful for a pregnant woman's developing baby. A common flu symptom is fever, which may be associated with neural tube defects and other adverse outcomes for a developing baby. Getting vaccinated can also help protect a baby after birth from flu. (Mom passes antibodies onto the developing baby during her pregnancy.)  A Flu Vaccine is the Best Protection Against Flu Getting a flu vaccine is the first and most important step in protecting against flu. Pregnant women should get a flu shot and not the live attenuated influenza vaccine (LAIV), also known as nasal spray flu vaccine. Flu vaccines given during pregnancy help protect both the mother and her baby from flu. Vaccination has been shown to reduce the risk of flu-associated acute respiratory infection in pregnant women by up to one-half. A 2018 study showed that getting a flu shot reduced a pregnant woman's risk of being hospitalized with flu by an average of 40 percent. Pregnant women who get a flu vaccine are also helping to protect their babies from flu illness for the first several months after their birth, when they are too young to get vaccinated.   A Long Record of Safety for Flu Shots in Pregnant Women Flu shots have been given to millions of pregnant women over many years with a good safety record. There is a lot of  evidence that flu vaccines can be given safely during pregnancy; though these data are limited for the first trimester. The CDC recommends that pregnant women get vaccinated during any trimester of their pregnancy. It is very important for pregnant women to get the flu shot.   Other Preventive Actions In addition to getting a flu shot, pregnant women should take the same everyday preventive actions the CDC recommends of everyone, including covering coughs, washing hands often, and avoiding people who are sick.  Symptoms and Treatment If you get sick with flu symptoms call your doctor right away. There are antiviral drugs that can treat flu illness and prevent serious flu complications. The CDC recommends prompt treatment for people who have influenza infection or suspected influenza infection and who are at high risk of serious flu complications, such as people with asthma, diabetes (including gestational diabetes), or heart disease. Early treatment of influenza in hospitalized pregnant women has been shown to reduce the length of the hospital stay.  Symptoms Flu symptoms include fever, cough, sore throat, runny or stuffy nose, body aches, headache, chills and fatigue. Some people may also have vomiting and diarrhea. People may be infected with the flu and have respiratory symptoms without a fever.  Early Treatment is Important for Pregnant Women Treatment should begin as soon as possible because antiviral drugs work best when started early (within 48 hours after symptoms start). Antiviral drugs can make your flu illness milder and make you feel better faster. They may also prevent serious health problems that can result from flu illness. Oral oseltamivir (Tamiflu) is the preferred treatment for pregnant women because it has the most studies available to suggest that it is safe and beneficial. Antiviral drugs require a prescription from your provider. Having a fever caused by flu infection or other  infections early in pregnancy may be linked to birth defects in a baby. In addition to taking antiviral drugs, pregnant  women who get a fever should treat their fever with Tylenol (acetaminophen) and contact their provider immediately.  When to Seek Emergency Medical Care If you are pregnant and have any of these signs, seek care immediately:  Difficulty breathing or shortness of breath  Pain or pressure in the chest or abdomen  Sudden dizziness  Confusion  Severe or persistent vomiting  High fever that is not responding to Tylenol (or store brand equivalent)  Decreased or no movement of your baby  [email or web address]

## 2018-03-11 NOTE — Progress Notes (Signed)
   LOW-RISK PREGNANCY VISIT Patient name: Mindy DadaMelissa C Palazzola MRN [id number]  Date of birth: 12-12-1980 Chief Complaint:   Routine Prenatal Visit  History of Present Illness:   Mindy DadaMelissa C Conkright is a 37 y.o. 762P1001 female at 6672w1d with an Estimated Date of Delivery: 05/12/18 being seen today for ongoing management of a low-risk pregnancy.  Today she reports low back pain, leg cramps. Contractions: Not present. Vag. Bleeding: None.  Movement: Present. denies leaking of fluid. Review of Systems:   Pertinent items are noted in HPI Denies abnormal vaginal discharge w/ itching/odor/irritation, headaches, visual changes, shortness of breath, chest pain, abdominal pain, severe nausea/vomiting, or problems with urination or bowel movements unless otherwise stated above. Pertinent History Reviewed:  Reviewed past medical,surgical, social, obstetrical and family history.  Reviewed problem list, medications and allergies. Physical Assessment:   Vitals:   03/11/18 0916  BP: 125/65  Pulse: 80  Weight: 138 lb (62.6 kg)  Body mass index is 25.24 kg/m.        Physical Examination:   General appearance: Well appearing, and in no distress  Mental status: Alert, oriented to person, place, and time  Skin: Warm & dry  Cardiovascular: Normal heart rate noted  Respiratory: Normal respiratory effort, no distress  Abdomen: Soft, gravid, nontender  Pelvic: Cervical exam deferred         Extremities: Edema: None  Fetal Status: Fetal Heart Rate (bpm): 140 Fundal Height: 32 cm Movement: Present    Results for orders placed or performed in visit on 03/11/18 (from the past 24 hour(s))  POC Urinalysis Dipstick OB   Collection Time: 03/11/18  9:17 AM  Result Value Ref Range   Color, UA     Clarity, UA     Glucose, UA Negative Negative   Bilirubin, UA     Ketones, UA neg    Spec Grav, UA     Blood, UA neg    pH, UA     POC Protein UA Negative Negative, Trace   Urobilinogen, UA     Nitrite, UA neg    Leukocytes, UA Negative Negative   Appearance     Odor      Assessment & Plan:  1) Low-risk pregnancy G2P1001 at 8572w1d with an Estimated Date of Delivery: 05/12/18   2) Low back pain, gave printed prevention/relief measures   3) Leg cramps, gave printed prevention/relief measures  4) Hypothyroidism> continue synthroid    Meds: No orders of the defined types were placed in this encounter.  Labs/procedures today: declined flu & tdap today, wants next visit  Plan:  Continue routine obstetrical care   Reviewed: Preterm labor symptoms and general obstetric precautions including but not limited to vaginal bleeding, contractions, leaking of fluid and fetal movement were reviewed in detail with the patient.  All questions were answered  Follow-up: Return in about 2 weeks (around 03/25/2018) for LROB.  Orders Placed This Encounter  Procedures  . POC Urinalysis Dipstick OB   Cheral MarkerKimberly R Kacia Halley CNM, [facility] 03/11/2018 9:27 AM

## 2018-03-14 ENCOUNTER — Telehealth: Payer: Self-pay | Admitting: Obstetrics & Gynecology

## 2018-03-14 MED ORDER — OMEPRAZOLE 40 MG PO CPDR: 40.0000 mg | DELAYED_RELEASE_CAPSULE | Freq: Every day | ORAL | 3 refills | Status: DC

## 2018-03-14 NOTE — Telephone Encounter (Signed)
Patient called stating that her PCP normally fill her Prilosec 40mg , but they told her she hasn't been in for a physical so they will not fill it. Pt would like to know if we could fill the medication for her. [place] [facility] [place]: [phone], Please contact pt

## 2018-03-25 ENCOUNTER — Ambulatory Visit (INDEPENDENT_AMBULATORY_CARE_PROVIDER_SITE_OTHER): Payer: BLUE CROSS/BLUE SHIELD | Admitting: Women's Health

## 2018-03-25 ENCOUNTER — Encounter: Payer: Self-pay | Admitting: Women's Health

## 2018-03-25 VITALS — BP 123/72 | HR 90 | Wt 138.0 lb

## 2018-03-25 DIAGNOSIS — O99283 Endocrine, nutritional and metabolic diseases complicating pregnancy, third trimester: Secondary | ICD-10-CM

## 2018-03-25 DIAGNOSIS — Z1389 Encounter for screening for other disorder: Secondary | ICD-10-CM

## 2018-03-25 DIAGNOSIS — Z331 Pregnant state, incidental: Secondary | ICD-10-CM

## 2018-03-25 DIAGNOSIS — Z23 Encounter for immunization: Secondary | ICD-10-CM

## 2018-03-25 DIAGNOSIS — O99013 Anemia complicating pregnancy, third trimester: Secondary | ICD-10-CM | POA: Diagnosis not present

## 2018-03-25 DIAGNOSIS — Z3483 Encounter for supervision of other normal pregnancy, third trimester: Secondary | ICD-10-CM

## 2018-03-25 DIAGNOSIS — Z3A33 33 weeks gestation of pregnancy: Secondary | ICD-10-CM

## 2018-03-25 DIAGNOSIS — E038 Other specified hypothyroidism: Secondary | ICD-10-CM

## 2018-03-25 LAB — POCT URINALYSIS DIPSTICK OB
Blood, UA: NEGATIVE
Glucose, UA: NEGATIVE
Ketones, UA: NEGATIVE
Leukocytes, UA: NEGATIVE
Nitrite, UA: NEGATIVE
POC,PROTEIN,UA: NEGATIVE

## 2018-03-25 LAB — POCT HEMOGLOBIN: Hemoglobin: 9.5 g/dL — AB (ref 12.2–16.2)

## 2018-03-25 NOTE — Progress Notes (Addendum)
   LOW-RISK PREGNANCY VISIT Patient name: Mindy Bright MRN [id number]  Date of birth: Feb 27, 1981 Chief Complaint:   Routine Prenatal Visit  History of Present Illness:   Mindy Bright is a 37 y.o. G65P1001 female at [redacted]w[redacted]d with an Estimated Date of Delivery: 05/12/18 being seen today for ongoing management of a low-risk pregnancy.  Today she reports no complaints. Mild anemia w/ PN2 labs, hgb 10.8, taking pnv and Fe daily, wants to recheck today. Contractions: Not present. Vag. Bleeding: None.  Movement: Present. denies leaking of fluid. Review of Systems:   Pertinent items are noted in HPI Denies abnormal vaginal discharge w/ itching/odor/irritation, headaches, visual changes, shortness of breath, chest pain, abdominal pain, severe nausea/vomiting, or problems with urination or bowel movements unless otherwise stated above. Pertinent History Reviewed:  Reviewed past medical,surgical, social, obstetrical and family history.  Reviewed problem list, medications and allergies. Physical Assessment:   Vitals:   03/25/18 0850  BP: 123/72  Pulse: 90  Weight: 138 lb (62.6 kg)  Body mass index is 25.24 kg/m.        Physical Examination:   General appearance: Well appearing, and in no distress  Mental status: Alert, oriented to person, place, and time  Skin: Warm & dry  Cardiovascular: Normal heart rate noted  Respiratory: Normal respiratory effort, no distress  Abdomen: Soft, gravid, nontender  Pelvic: Cervical exam deferred         Extremities: Edema: None  Fetal Status: Fetal Heart Rate (bpm): 158 Fundal Height: 33 cm Movement: Present    Results for orders placed or performed in visit on 03/25/18 (from the past 24 hour(s))  POC Urinalysis Dipstick OB   Collection Time: 03/25/18  8:58 AM  Result Value Ref Range   Color, UA     Clarity, UA     Glucose, UA Negative Negative   Bilirubin, UA     Ketones, UA neg    Spec Grav, UA     Blood, UA neg    pH, UA     POC Protein UA  Negative Negative, Trace   Urobilinogen, UA     Nitrite, UA neg    Leukocytes, UA Negative Negative   Appearance     Odor    POCT hemoglobin   Collection Time: 03/25/18 10:09 AM  Result Value Ref Range   Hemoglobin 9.5 (A) 12.2 - 16.2 g/dL    Assessment & Plan:  1) Low-risk pregnancy G2P1001 at [redacted]w[redacted]d with an Estimated Date of Delivery: 05/12/18   2) Hypothyroidism, continue synthroid daily  3) Anemia> increase Fe to BID   Meds: No orders of the defined types were placed in this encounter.  Labs/procedures today: tdap,  fingerstick hgb per request. Wants flu shot next visit  Plan:  Continue routine obstetrical care   Reviewed: Preterm labor symptoms and general obstetric precautions including but not limited to vaginal bleeding, contractions, leaking of fluid and fetal movement were reviewed in detail with the patient.  All questions were answered  Follow-up: Return in about 2 weeks (around 04/08/2018) for LROB.  Orders Placed This Encounter  Procedures  . Tdap vaccine greater than or equal to 7yo IM  . POC Urinalysis Dipstick OB  . POCT hemoglobin   Cheral Marker CNM, [facility] 03/25/2018 10:09 AM

## 2018-03-25 NOTE — Addendum Note (Signed)
Addended by: Shawna Clamp R on: 03/25/2018 10:09 AM   Modules accepted: Orders

## 2018-03-25 NOTE — Patient Instructions (Signed)
Mindy Bright, I greatly value your feedback.  If you receive a survey following your visit with [place] today, we appreciate you taking the time to fill it out.  Thanks, [name], CNM, WHNP-BC   Call the office [phone]) or go to [facility] if:  You begin to have strong, frequent contractions  Your water breaks.  Sometimes it is a big gush of fluid, sometimes it is just a trickle that keeps getting your panties wet or running down your legs  You have vaginal bleeding.  It is normal to have a small amount of spotting if your cervix was checked.   You don't feel your baby moving like normal.  If you don't, get you something to eat and drink and lay down and focus on feeling your baby move.  You should feel at least 10 movements in 2 hours.  If you don't, you should call the office or go to [facility].     Preterm Labor and Birth Information The normal length of a pregnancy is 39-41 weeks. Preterm labor is when labor starts before 37 completed weeks of pregnancy. What are the risk factors for preterm labor? Preterm labor is more likely to occur in women who:  Have certain infections during pregnancy such as a bladder infection, sexually transmitted infection, or infection inside the uterus (chorioamnionitis).  Have a shorter-than-normal cervix.  Have gone into preterm labor before.  Have had surgery on their cervix.  Are younger than age 37 or older than age 64.  Are African American.  Are pregnant with twins or multiple babies (multiple gestation).  Take street drugs or smoke while pregnant.  Do not gain enough weight while pregnant.  Became pregnant shortly after having been pregnant.  What are the symptoms of preterm labor? Symptoms of preterm labor include:  Cramps similar to those that can happen during a menstrual period. The cramps may happen with diarrhea.  Pain in the abdomen or lower back.  Regular uterine contractions that may feel like tightening  of the abdomen.  A feeling of increased pressure in the pelvis.  Increased watery or bloody mucus discharge from the vagina.  Water breaking (ruptured amniotic sac).  Why is it important to recognize signs of preterm labor? It is important to recognize signs of preterm labor because babies who are born prematurely may not be fully developed. This can put them at an increased risk for:  Long-term (chronic) heart and lung problems.  Difficulty immediately after birth with regulating body systems, including blood sugar, body temperature, heart rate, and breathing rate.  Bleeding in the brain.  Cerebral palsy.  Learning difficulties.  Death.  These risks are highest for babies who are born before 34 weeks of pregnancy. How is preterm labor treated? Treatment depends on the length of your pregnancy, your condition, and the health of your baby. It may involve:  Having a stitch (suture) placed in your cervix to prevent your cervix from opening too early (cerclage).  Taking or being given medicines, such as: ? Hormone medicines. These may be given early in pregnancy to help support the pregnancy. ? Medicine to stop contractions. ? Medicines to help mature the baby's lungs. These may be prescribed if the risk of delivery is high. ? Medicines to prevent your baby from developing cerebral palsy.  If the labor happens before 34 weeks of pregnancy, you may need to stay in the hospital. What should I do if I think I am in preterm labor? If  you think that you are going into preterm labor, call your health care provider right away. How can I prevent preterm labor in future pregnancies? To increase your chance of having a full-term pregnancy:  Do not use any tobacco products, such as cigarettes, chewing tobacco, and e-cigarettes. If you need help quitting, ask your health care provider.  Do not use street drugs or medicines that have not been prescribed to you during your pregnancy.  Talk  with your health care provider before taking any herbal supplements, even if you have been taking them regularly.  Make sure you gain a healthy amount of weight during your pregnancy.  Watch for infection. If you think that you might have an infection, get it checked right away.  Make sure to tell your health care provider if you have gone into preterm labor before.  This information is not intended to replace advice given to you by your health care provider. Make sure you discuss any questions you have with your health care provider. Document Released: 09/05/2003 Document Revised: 11/26/2015 Document Reviewed: 11/06/2015 Elsevier Interactive Patient Education  2018 [employer].

## 2018-03-26 ENCOUNTER — Encounter (HOSPITAL_COMMUNITY): Payer: Self-pay

## 2018-03-26 ENCOUNTER — Observation Stay (HOSPITAL_COMMUNITY)
Admission: AD | Admit: 2018-03-26 | Discharge: 2018-03-27 | Disposition: A | Discharge status: Home / Self Care | Payer: BLUE CROSS/BLUE SHIELD | Source: Ambulatory Visit | Attending: Obstetrics & Gynecology | Admitting: Obstetrics & Gynecology

## 2018-03-26 ENCOUNTER — Other Ambulatory Visit: Payer: Self-pay

## 2018-03-26 DIAGNOSIS — O4700 False labor before 37 completed weeks of gestation, unspecified trimester: Secondary | ICD-10-CM | POA: Diagnosis present

## 2018-03-26 DIAGNOSIS — E039 Hypothyroidism, unspecified: Secondary | ICD-10-CM | POA: Diagnosis not present

## 2018-03-26 DIAGNOSIS — O99283 Endocrine, nutritional and metabolic diseases complicating pregnancy, third trimester: Secondary | ICD-10-CM | POA: Insufficient documentation

## 2018-03-26 DIAGNOSIS — Z87891 Personal history of nicotine dependence: Secondary | ICD-10-CM | POA: Insufficient documentation

## 2018-03-26 DIAGNOSIS — Z880 Allergy status to penicillin: Secondary | ICD-10-CM | POA: Diagnosis not present

## 2018-03-26 DIAGNOSIS — O479 False labor, unspecified: Secondary | ICD-10-CM | POA: Diagnosis present

## 2018-03-26 DIAGNOSIS — Z79899 Other long term (current) drug therapy: Secondary | ICD-10-CM | POA: Diagnosis not present

## 2018-03-26 DIAGNOSIS — K219 Gastro-esophageal reflux disease without esophagitis: Secondary | ICD-10-CM | POA: Insufficient documentation

## 2018-03-26 DIAGNOSIS — Z3A33 33 weeks gestation of pregnancy: Secondary | ICD-10-CM | POA: Insufficient documentation

## 2018-03-26 DIAGNOSIS — O99613 Diseases of the digestive system complicating pregnancy, third trimester: Secondary | ICD-10-CM | POA: Diagnosis not present

## 2018-03-26 DIAGNOSIS — Z3483 Encounter for supervision of other normal pregnancy, third trimester: Secondary | ICD-10-CM

## 2018-03-26 DIAGNOSIS — O4703 False labor before 37 completed weeks of gestation, third trimester: Secondary | ICD-10-CM | POA: Diagnosis present

## 2018-03-26 DIAGNOSIS — Z79890 Hormone replacement therapy (postmenopausal): Secondary | ICD-10-CM | POA: Insufficient documentation

## 2018-03-26 DIAGNOSIS — O99013 Anemia complicating pregnancy, third trimester: Secondary | ICD-10-CM | POA: Insufficient documentation

## 2018-03-26 LAB — URINALYSIS, ROUTINE W REFLEX MICROSCOPIC
Bilirubin Urine: NEGATIVE
Glucose, UA: NEGATIVE mg/dL
Hgb urine dipstick: NEGATIVE
Ketones, ur: 5 mg/dL — AB
Leukocytes, UA: NEGATIVE
Nitrite: NEGATIVE
Protein, ur: 30 mg/dL — AB
Specific Gravity, Urine: 1.02 (ref 1.005–1.030)
pH: 5 (ref 5.0–8.0)

## 2018-03-26 MED ORDER — BETAMETHASONE SOD PHOS & ACET 6 (3-3) MG/ML IJ SUSP
12.0000 mg | Freq: Once | INTRAMUSCULAR | Status: AC
  Administered 2018-03-26: 12 mg via INTRAMUSCULAR
  Filled 2018-03-26: qty 2

## 2018-03-26 MED ORDER — FAMOTIDINE 20 MG PO TABS
20.0000 mg | ORAL_TABLET | Freq: Once | ORAL | Status: AC
  Administered 2018-03-26: 20 mg via ORAL
  Filled 2018-03-26: qty 1

## 2018-03-26 MED ORDER — NIFEDIPINE 10 MG PO CAPS
10.0000 mg | ORAL_CAPSULE | ORAL | Status: DC | PRN
  Administered 2018-03-26 – 2018-03-27 (×3): 10 mg via ORAL
  Filled 2018-03-26 (×4): qty 1

## 2018-03-26 MED ORDER — ONDANSETRON 8 MG PO TBDP
8.0000 mg | ORAL_TABLET | Freq: Once | ORAL | Status: AC
  Administered 2018-03-26: 8 mg via ORAL
  Filled 2018-03-26: qty 1

## 2018-03-26 MED ORDER — LACTATED RINGERS IV BOLUS
1000.0000 mL | Freq: Once | INTRAVENOUS | Status: AC
  Administered 2018-03-26: 1000 mL via INTRAVENOUS

## 2018-03-26 NOTE — MAU Note (Signed)
Woke up at 6 pm and felt acid in back of throat and then started vomiting every 10 min.  Looks like all bile now. Was having some acid reflux problems and couldn't remember if took Prilosec today.  No diarrhea.

## 2018-03-27 ENCOUNTER — Other Ambulatory Visit: Payer: Self-pay | Admitting: Obstetrics & Gynecology

## 2018-03-27 DIAGNOSIS — O4703 False labor before 37 completed weeks of gestation, third trimester: Secondary | ICD-10-CM | POA: Diagnosis present

## 2018-03-27 DIAGNOSIS — O4700 False labor before 37 completed weeks of gestation, unspecified trimester: Secondary | ICD-10-CM | POA: Diagnosis present

## 2018-03-27 DIAGNOSIS — O479 False labor, unspecified: Secondary | ICD-10-CM | POA: Diagnosis present

## 2018-03-27 LAB — TYPE AND SCREEN
ABO/RH(D): O POS
Antibody Screen: NEGATIVE

## 2018-03-27 LAB — CBC
HCT: 30.1 % — ABNORMAL LOW (ref 36.0–46.0)
Hemoglobin: 10.1 g/dL — ABNORMAL LOW (ref 12.0–15.0)
MCH: 29.3 pg (ref 26.0–34.0)
MCHC: 33.6 g/dL (ref 30.0–36.0)
MCV: 87.2 fL (ref 78.0–100.0)
Platelets: 245 10*3/uL (ref 150–400)
RBC: 3.45 MIL/uL — ABNORMAL LOW (ref 3.87–5.11)
RDW: 13.3 % (ref 11.5–15.5)
WBC: 17.1 10*3/uL — ABNORMAL HIGH (ref 4.0–10.5)

## 2018-03-27 LAB — GROUP B STREP BY PCR: Group B strep by PCR: NEGATIVE

## 2018-03-27 MED ORDER — TERBUTALINE SULFATE 1 MG/ML IJ SOLN: 0.2500 mg | Freq: Once | INTRAMUSCULAR | Status: DC

## 2018-03-27 MED ORDER — LEVOTHYROXINE SODIUM 50 MCG PO TABS
50.0000 ug | ORAL_TABLET | Freq: Every day | ORAL | Status: DC
  Administered 2018-03-27: 50 ug via ORAL
  Filled 2018-03-27 (×2): qty 1

## 2018-03-27 MED ORDER — ACETAMINOPHEN 325 MG PO TABS: 650.0000 mg | ORAL_TABLET | ORAL | Status: DC | PRN

## 2018-03-27 MED ORDER — NIFEDIPINE 10 MG PO CAPS
10.0000 mg | ORAL_CAPSULE | Freq: Three times a day (TID) | ORAL | Status: DC
  Administered 2018-03-27: 10 mg via ORAL

## 2018-03-27 MED ORDER — ONDANSETRON 4 MG PO TBDP: 4.0000 mg | ORAL_TABLET | Freq: Three times a day (TID) | ORAL | 0 refills | Status: DC | PRN

## 2018-03-27 MED ORDER — LACTATED RINGERS IV BOLUS
1000.0000 mL | Freq: Once | INTRAVENOUS | Status: AC
  Administered 2018-03-27: 1000 mL via INTRAVENOUS

## 2018-03-27 MED ORDER — FAMOTIDINE 20 MG PO TABS
20.0000 mg | ORAL_TABLET | Freq: Two times a day (BID) | ORAL | Status: DC
  Administered 2018-03-27: 20 mg via ORAL
  Filled 2018-03-27: qty 1

## 2018-03-27 MED ORDER — OXYTOCIN 40 UNITS IN LACTATED RINGERS INFUSION - SIMPLE MED: 2.5000 [IU]/h | INTRAVENOUS | Status: DC

## 2018-03-27 MED ORDER — NIFEDIPINE 10 MG PO CAPS: 10.0000 mg | ORAL_CAPSULE | Freq: Three times a day (TID) | ORAL | 0 refills | Status: DC

## 2018-03-27 MED ORDER — OXYCODONE-ACETAMINOPHEN 5-325 MG PO TABS: 1.0000 | ORAL_TABLET | ORAL | Status: DC | PRN

## 2018-03-27 MED ORDER — BETAMETHASONE SOD PHOS & ACET 6 (3-3) MG/ML IJ SUSP
12.0000 mg | Freq: Once | INTRAMUSCULAR | Status: DC
  Filled 2018-03-27: qty 2

## 2018-03-27 MED ORDER — CLINDAMYCIN PHOSPHATE 900 MG/50ML IV SOLN: 900.0000 mg | Freq: Three times a day (TID) | INTRAVENOUS | Status: DC

## 2018-03-27 MED ORDER — LIDOCAINE HCL (PF) 1 % IJ SOLN: 30.0000 mL | INTRAMUSCULAR | Status: DC | PRN

## 2018-03-27 MED ORDER — ONDANSETRON HCL 4 MG/2ML IJ SOLN
4.0000 mg | Freq: Four times a day (QID) | INTRAMUSCULAR | Status: DC | PRN
  Administered 2018-03-27: 4 mg via INTRAVENOUS
  Filled 2018-03-27: qty 2

## 2018-03-27 MED ORDER — LACTATED RINGERS IV SOLN: 500.0000 mL | INTRAVENOUS | Status: DC | PRN

## 2018-03-27 MED ORDER — BETAMETHASONE SOD PHOS & ACET 6 (3-3) MG/ML IJ SUSP: 12.0000 mg | Freq: Once | INTRAMUSCULAR | Status: DC

## 2018-03-27 MED ORDER — VANCOMYCIN HCL IN DEXTROSE 1-5 GM/200ML-% IV SOLN
1000.0000 mg | Freq: Two times a day (BID) | INTRAVENOUS | Status: DC
  Administered 2018-03-27: 1000 mg via INTRAVENOUS
  Filled 2018-03-27 (×3): qty 200

## 2018-03-27 MED ORDER — LACTATED RINGERS IV SOLN
INTRAVENOUS | Status: DC
  Administered 2018-03-27 (×2): via INTRAVENOUS

## 2018-03-27 MED ORDER — TERBUTALINE SULFATE 1 MG/ML IJ SOLN
0.2500 mg | Freq: Once | INTRAMUSCULAR | Status: AC
  Administered 2018-03-27: 0.25 mg via SUBCUTANEOUS
  Filled 2018-03-27: qty 1

## 2018-03-27 MED ORDER — SOD CITRATE-CITRIC ACID 500-334 MG/5ML PO SOLN
30.0000 mL | ORAL | Status: DC | PRN
  Administered 2018-03-27: 30 mL via ORAL
  Filled 2018-03-27: qty 15

## 2018-03-27 MED ORDER — OXYCODONE-ACETAMINOPHEN 5-325 MG PO TABS: 2.0000 | ORAL_TABLET | ORAL | Status: DC | PRN

## 2018-03-27 MED ORDER — OXYTOCIN BOLUS FROM INFUSION: 500.0000 mL | Freq: Once | INTRAVENOUS | Status: DC

## 2018-03-27 NOTE — H&P (Addendum)
OBSTETRIC ADMISSION HISTORY AND PHYSICAL  Mindy Bright is a 37 y.o. female G2P1001 with IUP at [redacted]w[redacted]d by L/8 presenting for contractions following intercourse.  Pt has been having frequent contractions in the MAU and has shown cervical change. She reports +FMs, No LOF, no VB, no blurry vision, headaches or peripheral edema, and RUQ pain.  She plans on breast feeding. Her husband is planning to get a vasectomy for birth control. She received her prenatal care at [facility]   Dating: By L/8 --->  Estimated Date of Delivery: 05/12/18  Sono:  @[redacted]w[redacted]d , normal anatomy, breech presentation, 335g, 54% EFW   Prenatal History/Complications: Hypothyroidism  Past Medical History: Past Medical History:  Diagnosis Date  . Abnormal Pap smear   . Anemia   . Anxiety   . Gall bladder disease   . GERD (gastroesophageal reflux disease)   . H/O hiatal hernia   . Irregular periods 04/30/2014  . Patient desires pregnancy 12/11/2013  . Pregnant 10/26/2014  . Reflux 11/09/2014  . Thyroid disease   . Unspecified symptom associated with female genital organs 12/11/2013  . Vaginal discharge 12/11/2013  . Vaginal Pap smear, abnormal    >10 years ago, no treatment    Past Surgical History: Past Surgical History:  Procedure Laterality Date  . WISDOM TOOTH EXTRACTION      Obstetrical History: OB History    Gravida  2   Para  1   Term  1   Preterm      AB      Living  1     SAB      TAB      Ectopic      Multiple  0   Live Births  1           Social History: Social History   Socioeconomic History  . Marital status: Married    Spouse name: Not on file  . Number of children: 1  . Years of education: Not on file  . Highest education level: Not on file  Occupational History  . Occupation: PE Runner, broadcasting/film/video in [place]  Social Needs  . Financial resource strain: Not on file  . Food insecurity:    Worry: Not on file    Inability: Not on file  . Transportation needs:    Medical: Not on  file    Non-medical: Not on file  Tobacco Use  . Smoking status: Former Smoker    Packs/day: 0.25    Years: 3.00    Pack years: 0.75    Types: Cigarettes    Last attempt to quit: 07/14/2002    Years since quitting: 15.7  . Smokeless tobacco: Never Used  Substance and Sexual Activity  . Alcohol use: No    Comment: not now  . Drug use: No  . Sexual activity: Yes    Birth control/protection: None  Lifestyle  . Physical activity:    Days per week: Not on file    Minutes per session: Not on file  . Stress: Not on file  Relationships  . Social connections:    Talks on phone: Not on file    Gets together: Not on file    Attends religious service: Not on file    Active member of club or organization: Not on file    Attends meetings of clubs or organizations: Not on file    Relationship status: Not on file  Other Topics Concern  . Not on file  Social History Narrative  .  Not on file    Family History: Family History  Problem Relation Age of Onset  . Diabetes Mother   . Heart disease Paternal Grandfather   . Diabetes Paternal Grandfather   . Diabetes Maternal Grandmother   . Hypertension Father   . Heart disease Maternal Grandfather   . Gout Brother   . Diabetes Maternal Aunt   . Colon cancer Neg Hx   . Stomach cancer Neg Hx   . Pancreatic cancer Neg Hx   . Esophageal cancer Neg Hx   . Liver disease Neg Hx     Allergies: Allergies  Allergen Reactions  . Penicillins Swelling    Has patient had a PCN reaction causing immediate rash, facial/tongue/throat swelling, SOB or lightheadedness with hypotension: Yes Has patient had a PCN reaction causing severe rash involving mucus membranes or skin necrosis: No Has patient had a PCN reaction that required hospitalization Yes Has patient had a PCN reaction occurring within the last 10 years: No If all of the above answers are "NO", then may proceed with Cephalosporin use.    Medications Prior to Admission  Medication Sig  Dispense Refill Last Dose  . ferrous sulfate 325 (65 FE) MG tablet Take 1 tablet (325 mg total) by mouth 2 (two) times daily with a meal. 60 tablet 3 03/25/2018 at Unknown time  . levothyroxine (SYNTHROID, LEVOTHROID) 50 MCG tablet Take 1 tablet (50 mcg total) by mouth daily before breakfast. 30 tablet 6 03/26/2018 at Unknown time  . omeprazole (PRILOSEC) 40 MG capsule Take 1 capsule (40 mg total) by mouth daily. 30 capsule 3 03/26/2018 at Unknown time  . Prenatal Vit-Fe Fumarate-FA (PRENATAL VITAMIN PO) Take by mouth daily.   03/26/2018 at Unknown time     Review of Systems   All systems reviewed and negative except as stated in HPI  Blood pressure (!) 110/58, pulse 94, temperature 98.4 F (36.9 C), temperature source Oral, resp. rate 16, last menstrual period 08/05/2017, SpO2 99 %, currently breastfeeding. General appearance: alert, cooperative, appears stated age and no distress Lungs: clear to auscultation bilaterally Heart: regular rate and rhythm Abdomen: soft, non-tender; bowel sounds normal Pelvic: see below Extremities: Homans sign is negative, no sign of DVT Presentation: cephalic Fetal monitoringBaseline: 140 bpm, Variability: Good {> 6 bpm), Accelerations: Reactive and Decelerations: Absent Uterine activity difficult to assess on tocometer Dilation: 2.5 Effacement (%): 70 Station: -2 Exam by:: Lanice Shirts CNM   Prenatal labs: ABO, Rh: O/Positive/-- (04/18 1650) Antibody: Negative (08/22 0923) Rubella: 1.44 (04/18 1650) RPR: Non Reactive (08/22 0923)  HBsAg: Negative (04/18 1650)  HIV: Non Reactive (08/22 0923)  GBS:    2 hr Glucola normal Genetic screening  negative Anatomy US normal  Prenatal Transfer Tool  Maternal Diabetes: No Genetic Screening: Normal Maternal Ultrasounds/Referrals: Normal Fetal Ultrasounds or other Referrals:  None Maternal Substance Abuse:  No Significant Maternal Medications:  Meds include: Syntroid Significant Maternal Lab Results:  None  Results for orders placed or performed during the hospital encounter of 03/26/18 (from the past 24 hour(s))  Urinalysis, Routine w reflex microscopic   Collection Time: 03/26/18 10:25 PM  Result Value Ref Range   Color, Urine YELLOW YELLOW   APPearance HAZY (A) CLEAR   Specific Gravity, Urine 1.020 1.005 - 1.030   pH 5.0 5.0 - 8.0   Glucose, UA NEGATIVE NEGATIVE mg/dL   Hgb urine dipstick NEGATIVE NEGATIVE   Bilirubin Urine NEGATIVE NEGATIVE   Ketones, ur 5 (A) NEGATIVE mg/dL   Protein, ur 30 (  A) NEGATIVE mg/dL   Nitrite NEGATIVE NEGATIVE   Leukocytes, UA NEGATIVE NEGATIVE   RBC / HPF 0-5 0 - 5 RBC/hpf   WBC, UA 0-5 0 - 5 WBC/hpf   Bacteria, UA RARE (A) NONE SEEN   Squamous Epithelial / LPF 0-5 0 - 5   Mucus PRESENT     Patient Active Problem List   Diagnosis Date Noted  . Anemia during pregnancy in third trimester 03/25/2018  . Supervision of normal pregnancy 10/14/2017  . Cough 07/23/2017  . Tingling of skin 12/29/2016  . Cholelithiasis 12/29/2016  . History of abnormal cervical Pap smear 12/25/2016  . Hypothyroidism 09/06/2016  . GERD (gastroesophageal reflux disease) 11/09/2014    Assessment/Plan:  Mindy Bright is a 37 y.o. G2P1001 at [redacted]w[redacted]d here for preterm labor.  #Labor: Will attempt to delay labor with tocolytics while BMZ is administered. Pt has received procardia x3 and terb x1. #Pain: Planning epidural #FWB: Category I #ID:  GBS unknown, will treat due to preterm, PCN allergy mild, Ancef #MOF: breast #MOC:FOB vasectomy #Circ:  Yes, inpatient  Mirian Mo, MD  03/27/2018, 3:23 AM  OB FELLOW HISTORY AND PHYSICAL ATTESTATION  I have seen and examined this patient; I agree with above documentation in the resident's note.   Marcy Siren, D.O. OB Fellow  03/28/2018, 4:48 PM

## 2018-03-27 NOTE — Anesthesia Pain Management Evaluation Note (Signed)
  CRNA Pain Management Visit Note  Patient: Mindy Bright, 37 y.o., female  "Hello I am a member of the anesthesia team at [facility]. We have an anesthesia team available at all times to provide care throughout the hospital, including epidural management and anesthesia for C-section. I don't know your plan for the delivery whether it a natural birth, water birth, IV sedation, nitrous supplementation, doula or epidural, but we want to meet your pain goals."   1.Was your pain managed to your expectations on prior hospitalizations?   No   2.What is your expectation for pain management during this hospitalization?     Epidural  3.How can we help you reach that goal? epidural  Record the patient's initial score and the patient's pain goal.   Pain: 0  Pain Goal: 6 [facility] wants you to be able to say your pain was always managed very well.  Rica Records 03/27/2018

## 2018-03-27 NOTE — Discharge Summary (Signed)
Physician Discharge Summary  Patient ID: ZLATA ALCAIDE MRN: [id number] DOB/AGE: Jun 24, 1981 37 y.o.  Admit date: 03/26/2018 Discharge date: 03/27/2018  Admission Diagnoses: preterm contractions  Discharge Diagnoses: same Active Problems:   Preterm uterine contractions in third trimester, antepartum   Preterm contractions   Discharged Condition: stable  Hospital Course: pt was give SQ terb, one dose of procardia and IV hydration with cesation of contractions. BMX x 1 dose to repeat in office tomorrow morning.  Consults: None  Significant Diagnostic Studies: n/a  Treatments: IV hydration, antibiotics: vancomycin and steroids: BMX  Discharge Exam: Blood pressure (!) 103/52, pulse 84, temperature 98.1 F (36.7 C), temperature source Axillary, resp. rate 16, height 5\' 2"  (1.575 m), weight 62.6 kg, last menstrual period 08/05/2017, SpO2 100 %, currently breastfeeding. General appearance: alert, cooperative, appears stated age and no distress Head: Normocephalic, without obvious abnormality, atraumatic Resp: clear to auscultation bilaterally Cardio: regular rate and rhythm, S1, S2 normal, no murmur, click, rub or gallop and regular rate and rhythm GI: soft, non-tender; bowel sounds normal; no masses,  no organomegaly Pelvic: external genitalia normal, no adnexal masses or tenderness, vagina normal without discharge and SVE 2/70/-2 Extremities: extremities normal, atraumatic, no cyanosis or edema and no edema, redness or tenderness in the calves or thighs Skin: Skin color, texture, turgor normal. No rashes or lesions  Disposition: Discharge disposition: 01-Home or Self Care        Allergies as of 03/27/2018      Reactions   Penicillins Swelling   Has patient had a PCN reaction causing immediate rash, facial/tongue/throat swelling, SOB or lightheadedness with hypotension: Yes Has patient had a PCN reaction causing severe rash involving mucus membranes or skin necrosis: No Has  patient had a PCN reaction that required hospitalization Yes Has patient had a PCN reaction occurring within the last 10 years: No If all of the above answers are "NO", then may proceed with Cephalosporin use.      Medication List    TAKE these medications   ferrous sulfate 325 (65 FE) MG tablet Take 1 tablet (325 mg total) by mouth 2 (two) times daily with a meal.   levothyroxine 50 MCG tablet Commonly known as:  SYNTHROID, LEVOTHROID Take 1 tablet (50 mcg total) by mouth daily before breakfast.   NIFEdipine 10 MG capsule Commonly known as:  PROCARDIA Take 1 capsule (10 mg total) by mouth 3 (three) times daily.   omeprazole 40 MG capsule Commonly known as:  PRILOSEC Take 1 capsule (40 mg total) by mouth daily.   ondansetron 4 MG disintegrating tablet Commonly known as:  ZOFRAN-ODT Take 1 tablet (4 mg total) by mouth every 8 (eight) hours as needed for nausea or vomiting.   PRENATAL VITAMIN PO Take by mouth daily.      Plan: to [facility] morning for BMX shot and SVE  Signed: Wyvonnia Dusky 03/27/2018, 12:17 PM

## 2018-03-27 NOTE — Progress Notes (Signed)
Discharged per order. Discharge instructions and medications reviewed with pt. Pt aware of follow up appointment. Pt voiced understanding with no questions or concerns. Pt given return to work note and aware of best rest order. Husband at bedside per pt's request.  Adah Perl RN

## 2018-03-27 NOTE — MAU Provider Note (Signed)
History     CSN: [id number]  Arrival date and time: 03/26/18 2154   First Provider Initiated Contact with Patient 03/26/18 2237      Chief Complaint  Patient presents with  . Emesis During Pregnancy   Mindy Bright is a 37 y.o. G2P1 at [redacted]w[redacted]d who presents to MAU with complaints of N/V during pregnancy. She reports emesis started occurring this evening around 1800, she reports emesis that occurs every 10 minutes. She denies taking medication for nausea and vomiting prior to arrival to MAU. She has hx of acid reflux and reports taking a half of Prilosec. She reports N/V is associated with abdominal pain. She describes abdominal pain as epigastric pain and lower abdominal cramping, rates pain 2/10- denies feeling tightening or contractions. Reports IC this evening around 1700. She reports daughter has been having diarrhea over the past 2 days but denies her vomiting. She denies vaginal bleeding, vaginal discharge, urinary symptoms or HA.    OB History    Gravida  2   Para  1   Term  1   Preterm      AB      Living  1     SAB      TAB      Ectopic      Multiple  0   Live Births  1           Past Medical History:  Diagnosis Date  . Abnormal Pap smear   . Anemia   . Anxiety   . Gall bladder disease   . GERD (gastroesophageal reflux disease)   . H/O hiatal hernia   . Irregular periods 04/30/2014  . Patient desires pregnancy 12/11/2013  . Pregnant 10/26/2014  . Reflux 11/09/2014  . Thyroid disease   . Unspecified symptom associated with female genital organs 12/11/2013  . Vaginal discharge 12/11/2013  . Vaginal Pap smear, abnormal    >10 years ago, no treatment    Past Surgical History:  Procedure Laterality Date  . WISDOM TOOTH EXTRACTION      Family History  Problem Relation Age of Onset  . Diabetes Mother   . Heart disease Paternal Grandfather   . Diabetes Paternal Grandfather   . Diabetes Maternal Grandmother   . Hypertension Father   . Heart  disease Maternal Grandfather   . Gout Brother   . Diabetes Maternal Aunt   . Colon cancer Neg Hx   . Stomach cancer Neg Hx   . Pancreatic cancer Neg Hx   . Esophageal cancer Neg Hx   . Liver disease Neg Hx     Social History   Tobacco Use  . Smoking status: Former Smoker    Packs/day: 0.25    Years: 3.00    Pack years: 0.75    Types: Cigarettes    Last attempt to quit: 07/14/2002    Years since quitting: 15.7  . Smokeless tobacco: Never Used  Substance Use Topics  . Alcohol use: No    Comment: not now  . Drug use: No    Allergies:  Allergies  Allergen Reactions  . Penicillins Swelling    Has patient had a PCN reaction causing immediate rash, facial/tongue/throat swelling, SOB or lightheadedness with hypotension: Yes Has patient had a PCN reaction causing severe rash involving mucus membranes or skin necrosis: No Has patient had a PCN reaction that required hospitalization Yes Has patient had a PCN reaction occurring within the last 10 years: No If all of the above  answers are "NO", then may proceed with Cephalosporin use.    Medications Prior to Admission  Medication Sig Dispense Refill Last Dose  . ferrous sulfate 325 (65 FE) MG tablet Take 1 tablet (325 mg total) by mouth 2 (two) times daily with a meal. 60 tablet 3 03/25/2018 at Unknown time  . levothyroxine (SYNTHROID, LEVOTHROID) 50 MCG tablet Take 1 tablet (50 mcg total) by mouth daily before breakfast. 30 tablet 6 03/26/2018 at Unknown time  . omeprazole (PRILOSEC) 40 MG capsule Take 1 capsule (40 mg total) by mouth daily. 30 capsule 3 03/26/2018 at Unknown time  . Prenatal Vit-Fe Fumarate-FA (PRENATAL VITAMIN PO) Take by mouth daily.   03/26/2018 at Unknown time    Review of Systems  Constitutional: Negative.   Respiratory: Negative.   Cardiovascular: Negative.   Gastrointestinal: Positive for abdominal pain, nausea and vomiting. Negative for constipation and diarrhea.  Genitourinary: Negative.   Neurological:  Negative.    Physical Exam   Blood pressure (!) 103/49, pulse 89, temperature 97.7 F (36.5 C), temperature source Oral, resp. rate 16, last menstrual period 08/05/2017, SpO2 99 %, currently breastfeeding.  Physical Exam  Nursing note and vitals reviewed. Constitutional: She is oriented to person, place, and time. She appears well-developed and well-nourished. No distress.  HENT:  Head: Normocephalic.  Cardiovascular: Normal rate, regular rhythm and normal heart sounds.  Respiratory: Effort normal and breath sounds normal. No respiratory distress. She has no wheezes.  GI: Soft.  Gravid appropriate for gestational age  Musculoskeletal: Normal range of motion. She exhibits no edema.  Neurological: She is alert and oriented to person, place, and time.  Skin: Skin is warm and dry.  Psychiatric: She has a normal mood and affect. Her behavior is normal. Thought content normal.   Pelvic examination: 1/70/-2 , vertex presentation  MAU Course  Procedures  MDM Orders Placed This Encounter  Procedures  . Urinalysis, Routine w reflex microscopic  . Encourage fluids  . Insert peripheral IV   UA- 5 ketones and 30 protein present  Treatments in MAU included Zofran 8mg  PO, Pepcid 20mg  PO, IV LR bolus, Procardia x3 for preterm uterine contractions and BMZ injection   Meds ordered this encounter  Medications  . ondansetron (ZOFRAN-ODT) disintegrating tablet 8 mg  . famotidine (PEPCID) tablet 20 mg  . lactated ringers bolus 1,000 mL  . NIFEdipine (PROCARDIA) capsule 10 mg  . betamethasone acetate-betamethasone sodium phosphate (CELESTONE) injection 12 mg   Patient reports continued contractions after procardia and LR, 1 dose of terbutaline given.  Consult with Dr Shawnie Pons for assessment and management. Recommends admission to L&D for preterm contractions.   Assessment and Plan   1. Preterm uterine contractions   Admit to L&D for observation  GBS culture  BMZ  Orders placed for  admission  Report given to Labor team   Sharyon Cable, CNM 03/27/18, 3:16 AM

## 2018-03-28 ENCOUNTER — Ambulatory Visit (INDEPENDENT_AMBULATORY_CARE_PROVIDER_SITE_OTHER): Payer: BLUE CROSS/BLUE SHIELD | Admitting: Women's Health

## 2018-03-28 ENCOUNTER — Encounter: Payer: Self-pay | Admitting: Women's Health

## 2018-03-28 VITALS — BP 97/58 | HR 77 | Wt 136.5 lb

## 2018-03-28 DIAGNOSIS — Z3483 Encounter for supervision of other normal pregnancy, third trimester: Secondary | ICD-10-CM

## 2018-03-28 DIAGNOSIS — Z3A33 33 weeks gestation of pregnancy: Secondary | ICD-10-CM

## 2018-03-28 DIAGNOSIS — Z1389 Encounter for screening for other disorder: Secondary | ICD-10-CM

## 2018-03-28 DIAGNOSIS — Z331 Pregnant state, incidental: Secondary | ICD-10-CM

## 2018-03-28 LAB — POCT URINALYSIS DIPSTICK OB
GLUCOSE, UA: NEGATIVE
KETONES UA: NEGATIVE
Leukocytes, UA: NEGATIVE
Nitrite, UA: NEGATIVE
PROTEIN: NEGATIVE

## 2018-03-28 LAB — RPR: RPR Ser Ql: NONREACTIVE

## 2018-03-28 MED ORDER — BETAMETHASONE SOD PHOS & ACET 6 (3-3) MG/ML IJ SUSP
12.0000 mg | Freq: Once | INTRAMUSCULAR | Status: AC
  Administered 2018-03-28: 12 mg via INTRAMUSCULAR

## 2018-03-28 NOTE — Progress Notes (Signed)
Clear vaginal discharge with no odor. Pt received Betamethasone 12 mg in left gluteal. Pt tolerated shot fine. JSY

## 2018-03-28 NOTE — Patient Instructions (Signed)
Mindy Bright, I greatly value your feedback.  If you receive a survey following your visit with us today, we appreciate you taking the time to fill it out.  Thanks, Kim Booker, CNM, WHNP-BC   Call the office ([phone]) or go to [facility] if:  You begin to have strong, frequent contractions  Your water breaks.  Sometimes it is a big gush of fluid, sometimes it is just a trickle that keeps getting your panties wet or running down your legs  You have vaginal bleeding.  It is normal to have a small amount of spotting if your cervix was checked.   You don't feel your baby moving like normal.  If you don't, get you something to eat and drink and lay down and focus on feeling your baby move.  You should feel at least 10 movements in 2 hours.  If you don't, you should call the office or go to [facility].     Preterm Labor and Birth Information The normal length of a pregnancy is 39-41 weeks. Preterm labor is when labor starts before 37 completed weeks of pregnancy. What are the risk factors for preterm labor? Preterm labor is more likely to occur in women who:  Have certain infections during pregnancy such as a bladder infection, sexually transmitted infection, or infection inside the uterus (chorioamnionitis).  Have a shorter-than-normal cervix.  Have gone into preterm labor before.  Have had surgery on their cervix.  Are younger than age 17 or older than age 35.  Are African American.  Are pregnant with twins or multiple babies (multiple gestation).  Take street drugs or smoke while pregnant.  Do not gain enough weight while pregnant.  Became pregnant shortly after having been pregnant.  What are the symptoms of preterm labor? Symptoms of preterm labor include:  Cramps similar to those that can happen during a menstrual period. The cramps may happen with diarrhea.  Pain in the abdomen or lower back.  Regular uterine contractions that may feel like tightening  of the abdomen.  A feeling of increased pressure in the pelvis.  Increased watery or bloody mucus discharge from the vagina.  Water breaking (ruptured amniotic sac).  Why is it important to recognize signs of preterm labor? It is important to recognize signs of preterm labor because babies who are born prematurely may not be fully developed. This can put them at an increased risk for:  Long-term (chronic) heart and lung problems.  Difficulty immediately after birth with regulating body systems, including blood sugar, body temperature, heart rate, and breathing rate.  Bleeding in the brain.  Cerebral palsy.  Learning difficulties.  Death.  These risks are highest for babies who are born before 34 weeks of pregnancy. How is preterm labor treated? Treatment depends on the length of your pregnancy, your condition, and the health of your baby. It may involve:  Having a stitch (suture) placed in your cervix to prevent your cervix from opening too early (cerclage).  Taking or being given medicines, such as: ? Hormone medicines. These may be given early in pregnancy to help support the pregnancy. ? Medicine to stop contractions. ? Medicines to help mature the baby's lungs. These may be prescribed if the risk of delivery is high. ? Medicines to prevent your baby from developing cerebral palsy.  If the labor happens before 34 weeks of pregnancy, you may need to stay in the hospital. What should I do if I think I am in preterm labor? If   you think that you are going into preterm labor, call your health care provider right away. How can I prevent preterm labor in future pregnancies? To increase your chance of having a full-term pregnancy:  Do not use any tobacco products, such as cigarettes, chewing tobacco, and e-cigarettes. If you need help quitting, ask your health care provider.  Do not use street drugs or medicines that have not been prescribed to you during your pregnancy.  Talk  with your health care provider before taking any herbal supplements, even if you have been taking them regularly.  Make sure you gain a healthy amount of weight during your pregnancy.  Watch for infection. If you think that you might have an infection, get it checked right away.  Make sure to tell your health care provider if you have gone into preterm labor before.  This information is not intended to replace advice given to you by your health care provider. Make sure you discuss any questions you have with your health care provider. Document Released: 09/05/2003 Document Revised: 11/26/2015 Document Reviewed: 11/06/2015 Elsevier Interactive Patient Education  2018 [employer]  

## 2018-03-28 NOTE — Progress Notes (Signed)
LOW-RISK PREGNANCY VISIT Patient name: Mindy Bright MRN [id number]  Date of birth: Mar 18, 1981 Chief Complaint:   ER follow up (early labor)  History of Present Illness:   Mindy Bright is a 37 y.o. G41P1001 female at [redacted]w[redacted]d with an Estimated Date of Delivery: 05/12/18 being seen today for ongoing management of a low-risk pregnancy.  Today she reports d/c'd yesterday after night stay for ptl, was 1cm then 2cm, got BMZ x 1, rx'd procardia TID on d/c. Feeling much better, just random uc's. Is having a clear discharge, no itching/odor/irritation, definitely doesn't feel like water is broke. . Contractions: Not present. Vag. Bleeding: None.  Movement: Present. denies leaking of fluid. Review of Systems:   Pertinent items are noted in HPI Denies abnormal vaginal discharge w/ itching/odor/irritation, headaches, visual changes, shortness of breath, chest pain, abdominal pain, severe nausea/vomiting, or problems with urination or bowel movements unless otherwise stated above. Pertinent History Reviewed:  Reviewed past medical,surgical, social, obstetrical and family history.  Reviewed problem list, medications and allergies. Physical Assessment:   Vitals:   03/28/18 1004  BP: (!) 97/58  Pulse: 77  Weight: 136 lb 8 oz (61.9 kg)  Body mass index is 24.97 kg/m.        Physical Examination:   General appearance: Well appearing, and in no distress  Mental status: Alert, oriented to person, place, and time  Skin: Warm & dry  Cardiovascular: Normal heart rate noted  Respiratory: Normal respiratory effort, no distress  Abdomen: Soft, gravid, nontender  Pelvic: SSE: cx parous/floppy, no pooling/no change w/ valsalva, small amt light brown nonodorous d/c         Extremities: Edema: Trace  Fetal Status: Fetal Heart Rate (bpm): 128 Fundal Height: 33 cm Movement: Present    Results for orders placed or performed in visit on 03/28/18 (from the past 24 hour(s))  POC Urinalysis Dipstick OB   Collection Time: 03/28/18 10:05 AM  Result Value Ref Range   Color, UA     Clarity, UA     Glucose, UA Negative Negative   Bilirubin, UA     Ketones, UA neg    Spec Grav, UA     Blood, UA trace    pH, UA     POC Protein UA Negative Negative, Trace   Urobilinogen, UA     Nitrite, UA neg    Leukocytes, UA Negative Negative   Appearance     Odor    Results for orders placed or performed during the hospital encounter of 03/26/18 (from the past 24 hour(s))  Group B strep by PCR   Collection Time: 03/27/18 11:06 AM  Result Value Ref Range   Group B strep by PCR NEGATIVE NEGATIVE    Assessment & Plan:  1) Low-risk pregnancy G2P1001 at [redacted]w[redacted]d with an Estimated Date of Delivery: 05/12/18   2) Recent hospital stay for PTL>2nd BMZ today, continue procardia TID, stay well hydrated, discussed ptl s/s, reasons to seek care. Gave note for light duty (sitting only) effective Wed   Meds:  Meds ordered this encounter  Medications  . betamethasone acetate-betamethasone sodium phosphate (CELESTONE) injection 12 mg   Labs/procedures today: spec exam  Plan:  Continue routine obstetrical care   Reviewed: Preterm labor symptoms and general obstetric precautions including but not limited to vaginal bleeding, contractions, leaking of fluid and fetal movement were reviewed in detail with the patient.  All questions were answered  Follow-up: Return in about 1 week (around 04/04/2018) for LROB.  Orders  Placed This Encounter  Procedures  . POC Urinalysis Dipstick OB   Cheral Marker CNM, [facility] 03/28/2018 10:52 AM

## 2018-03-29 LAB — CULTURE, BETA STREP (GROUP B ONLY)

## 2018-03-30 ENCOUNTER — Telehealth: Payer: Self-pay | Admitting: Obstetrics & Gynecology

## 2018-03-30 NOTE — Telephone Encounter (Signed)
Patient states she has had diarrhea and nausea since taking the Procardia.  Also having right upper quad pain. WBC elevated. Unsure if it is gallbladder related or from the Procardia.  Please advise.

## 2018-03-31 ENCOUNTER — Encounter (HOSPITAL_COMMUNITY): Payer: Self-pay | Admitting: *Deleted

## 2018-03-31 ENCOUNTER — Inpatient Hospital Stay (HOSPITAL_COMMUNITY)
Admission: AD | Admit: 2018-03-31 | Discharge: 2018-04-01 | Disposition: A | Discharge status: Home / Self Care | Payer: BLUE CROSS/BLUE SHIELD | Source: Ambulatory Visit | Attending: Obstetrics & Gynecology | Admitting: Obstetrics & Gynecology

## 2018-03-31 DIAGNOSIS — R197 Diarrhea, unspecified: Secondary | ICD-10-CM

## 2018-03-31 DIAGNOSIS — Z3A34 34 weeks gestation of pregnancy: Secondary | ICD-10-CM | POA: Diagnosis not present

## 2018-03-31 DIAGNOSIS — R109 Unspecified abdominal pain: Secondary | ICD-10-CM | POA: Diagnosis not present

## 2018-03-31 DIAGNOSIS — N858 Other specified noninflammatory disorders of uterus: Secondary | ICD-10-CM

## 2018-03-31 DIAGNOSIS — R1011 Right upper quadrant pain: Secondary | ICD-10-CM | POA: Diagnosis not present

## 2018-03-31 DIAGNOSIS — O26893 Other specified pregnancy related conditions, third trimester: Secondary | ICD-10-CM | POA: Diagnosis not present

## 2018-03-31 DIAGNOSIS — K529 Noninfective gastroenteritis and colitis, unspecified: Secondary | ICD-10-CM | POA: Diagnosis not present

## 2018-03-31 DIAGNOSIS — G8929 Other chronic pain: Secondary | ICD-10-CM

## 2018-03-31 DIAGNOSIS — N859 Noninflammatory disorder of uterus, unspecified: Secondary | ICD-10-CM

## 2018-03-31 LAB — URINALYSIS, ROUTINE W REFLEX MICROSCOPIC
Bilirubin Urine: NEGATIVE
Glucose, UA: NEGATIVE mg/dL
Hgb urine dipstick: NEGATIVE
Ketones, ur: NEGATIVE mg/dL
Leukocytes, UA: NEGATIVE
NITRITE: NEGATIVE
PROTEIN: NEGATIVE mg/dL
SPECIFIC GRAVITY, URINE: 1.003 — AB (ref 1.005–1.030)
pH: 6 (ref 5.0–8.0)

## 2018-03-31 LAB — COMPREHENSIVE METABOLIC PANEL
ALBUMIN: 3 g/dL — AB (ref 3.5–5.0)
ALK PHOS: 141 U/L — AB (ref 38–126)
ALT: 23 U/L (ref 0–44)
ANION GAP: 11 (ref 5–15)
AST: 25 U/L (ref 15–41)
BUN: 11 mg/dL (ref 6–20)
CALCIUM: 9 mg/dL (ref 8.9–10.3)
CHLORIDE: 105 mmol/L (ref 98–111)
CO2: 17 mmol/L — AB (ref 22–32)
Creatinine, Ser: 0.65 mg/dL (ref 0.44–1.00)
GFR calc Af Amer: >60 mL/min (ref >60)
GFR calc non Af Amer: >60 mL/min (ref >60)
Glucose, Bld: 105 mg/dL — ABNORMAL HIGH (ref 70–99)
Potassium: 3.4 mmol/L — ABNORMAL LOW (ref 3.5–5.1)
SODIUM: 133 mmol/L — AB (ref 135–145)
Total Bilirubin: 0.6 mg/dL (ref 0.3–1.2)
Total Protein: 7.1 g/dL (ref 6.5–8.1)

## 2018-03-31 LAB — LIPASE, BLOOD: Lipase: 35 U/L (ref 11–51)

## 2018-03-31 NOTE — MAU Provider Note (Signed)
Chief Complaint:  Abdominal Pain   First Provider Initiated Contact with Patient 03/31/18 2256      HPI: Mindy Bright is a 37 y.o. G2P1001 at 74w0dwho presents to maternity admissions reporting diarrhea for several days.  Having it less often now but had several stools in a row and started feeling pain in abdomen, mostly RUQ, so was worried it might be her gallbladder.  Is actually better now.  Has had many workups for that and last HIDA scan was normal   . She reports good fetal movement, denies LOF, vaginal bleeding, vaginal itching/burning, urinary symptoms, h/a, dizziness, n/v, diarrhea, constipation or fever/chills.    Abdominal Pain  This is a recurrent problem. The current episode started in the past 7 days. The onset quality is gradual. The problem occurs intermittently. The problem has been waxing and waning. The pain is located in the RUQ. The quality of the pain is burning, cramping and sharp. The abdominal pain does not radiate. Associated symptoms include diarrhea. Pertinent negatives include no anorexia, constipation, dysuria, fever, frequency or myalgias. The pain is aggravated by palpation. The pain is relieved by nothing. She has tried nothing for the symptoms.   RN Note: Pt reports having had 6 bouts of watery diarrhea this past Monday and one bout tues, none wed then 4 bouts of watery  diarrhea today. The RUQ pain started 30 min after she ate and apple and some chicken noodle soup and has been ongoing since 4pm. Has lessened from an 8 to a 4 on the pain scale during the drive from [place] to [place]  Past Medical History: Past Medical History:  Diagnosis Date  . Abnormal Pap smear   . Anemia   . Anxiety   . Gall bladder disease   . GERD (gastroesophageal reflux disease)   . H/O hiatal hernia   . Irregular periods 04/30/2014  . Patient desires pregnancy 12/11/2013  . Pregnant 10/26/2014  . Reflux 11/09/2014  . Thyroid disease   . Unspecified symptom associated with  female genital organs 12/11/2013  . Vaginal discharge 12/11/2013  . Vaginal Pap smear, abnormal    >10 years ago, no treatment    Past obstetric history: OB History  Gravida Para Term Preterm AB Living  2 1 1     1   SAB TAB Ectopic Multiple Live Births        0 1    # Outcome Date GA Lbr Len/2nd Weight Sex Delivery Anes PTL Lv  2 Current           1 Term 06/15/15 [redacted]w[redacted]d 05:09 / 05:08 2645 g F Vag-Spont EPI N LIV    Past Surgical History: Past Surgical History:  Procedure Laterality Date  . WISDOM TOOTH EXTRACTION      Family History: Family History  Problem Relation Age of Onset  . Diabetes Mother   . Heart disease Paternal Grandfather   . Diabetes Paternal Grandfather   . Diabetes Maternal Grandmother   . Hypertension Father   . Heart disease Maternal Grandfather   . Gout Brother   . Diabetes Maternal Aunt   . Colon cancer Neg Hx   . Stomach cancer Neg Hx   . Pancreatic cancer Neg Hx   . Esophageal cancer Neg Hx   . Liver disease Neg Hx     Social History: Social History   Tobacco Use  . Smoking status: Former Smoker    Packs/day: 0.25    Years: 3.00    Pack years:  0.75    Types: Cigarettes    Last attempt to quit: 07/14/2002    Years since quitting: 15.7  . Smokeless tobacco: Never Used  Substance Use Topics  . Alcohol use: No    Comment: not now  . Drug use: No    Allergies:  Allergies  Allergen Reactions  . Penicillins Swelling    Has patient had a PCN reaction causing immediate rash, facial/tongue/throat swelling, SOB or lightheadedness with hypotension: Yes Has patient had a PCN reaction causing severe rash involving mucus membranes or skin necrosis: No Has patient had a PCN reaction that required hospitalization Yes Has patient had a PCN reaction occurring within the last 10 years: No If all of the above answers are "NO", then may proceed with Cephalosporin use.    Meds:  Medications Prior to Admission  Medication Sig Dispense Refill Last  Dose  . ferrous sulfate 325 (65 FE) MG tablet Take 1 tablet (325 mg total) by mouth 2 (two) times daily with a meal. 60 tablet 3 Taking  . levothyroxine (SYNTHROID, LEVOTHROID) 50 MCG tablet Take 1 tablet (50 mcg total) by mouth daily before breakfast. 30 tablet 6 Taking  . NIFEdipine (PROCARDIA) 10 MG capsule Take 1 capsule (10 mg total) by mouth 3 (three) times daily. 90 capsule 0 Taking  . omeprazole (PRILOSEC) 40 MG capsule Take 1 capsule (40 mg total) by mouth daily. 30 capsule 3 Taking  . ondansetron (ZOFRAN ODT) 4 MG disintegrating tablet Take 1 tablet (4 mg total) by mouth every 8 (eight) hours as needed for nausea or vomiting. 20 tablet 0 Taking  . Prenatal Vit-Fe Fumarate-FA (PRENATAL VITAMIN PO) Take by mouth daily.   Taking    I have reviewed patient's Past Medical Hx, Surgical Hx, Family Hx, Social Hx, medications and allergies.   ROS:  Review of Systems  Constitutional: Negative for fever.  Gastrointestinal: Positive for abdominal pain and diarrhea. Negative for anorexia and constipation.  Genitourinary: Negative for dysuria and frequency.  Musculoskeletal: Negative for myalgias.   Other systems negative  Physical Exam   Patient Vitals for the past 24 hrs:  BP Temp Pulse Resp SpO2 Height Weight  03/31/18 2155 118/63 98.1 F (36.7 C) 66 19 98 % 5\' 2"  (1.575 m) 60 kg   Constitutional: Well-developed, well-nourished female in no acute distress.  Cardiovascular: normal rate and rhythm Respiratory: normal effort, clear to auscultation bilaterally GI: Abd soft, non-tender except over RUQ.  Uterus gravid, appropriate for gestational age.   No rebound or guarding. MS: Extremities nontender, no edema, normal ROM Neurologic: Alert and oriented x 4.  GU: Neg CVAT.  PELVIC EXAM: Dilation: 2.5 Effacement (%): 50 Station: -3 Presentation: Vertex Exam by:: Artelia Laroche CNM  FHT:  Baseline 140 , moderate variability, accelerations present, no decelerations Contractions:   Irregular with irritability   Labs: --/--/O POS (09/29 0540) Results for orders placed or performed during the hospital encounter of 03/31/18 (from the past 24 hour(s))  Urinalysis, Routine w reflex microscopic     Status: Abnormal   Collection Time: 03/31/18 10:03 PM  Result Value Ref Range   Color, Urine STRAW (A) YELLOW   APPearance CLEAR CLEAR   Specific Gravity, Urine 1.003 (L) 1.005 - 1.030   pH 6.0 5.0 - 8.0   Glucose, UA NEGATIVE NEGATIVE mg/dL   Hgb urine dipstick NEGATIVE NEGATIVE   Bilirubin Urine NEGATIVE NEGATIVE   Ketones, ur NEGATIVE NEGATIVE mg/dL   Protein, ur NEGATIVE NEGATIVE mg/dL   Nitrite NEGATIVE NEGATIVE  Leukocytes, UA NEGATIVE NEGATIVE  CBC with Differential/Platelet     Status: Abnormal   Collection Time: 03/31/18 11:24 PM  Result Value Ref Range   WBC 13.3 (H) 4.0 - 10.5 K/uL   RBC 3.89 3.87 - 5.11 MIL/uL   Hemoglobin 11.5 (L) 12.0 - 15.0 g/dL   HCT 16.1 (L) 09.6 - 04.5 %   MCV 85.3 78.0 - 100.0 fL   MCH 29.6 26.0 - 34.0 pg   MCHC 34.6 30.0 - 36.0 g/dL   RDW 40.9 81.1 - 91.4 %   Platelets 296 150 - 400 K/uL   Neutrophils Relative % 71 %   Neutro Abs 9.5 1.7 - 7.7 K/uL   Lymphocytes Relative 22 %   Lymphs Abs 2.9 0.7 - 4.0 K/uL   Monocytes Relative 5 %   Monocytes Absolute 0.7 0.1 - 1.0 K/uL   Eosinophils Relative 2 %   Eosinophils Absolute 0.2 0.0 - 0.7 K/uL   Basophils Relative 0 %   Basophils Absolute 0.0 0.0 - 0.1 K/uL  Comprehensive metabolic panel     Status: Abnormal   Collection Time: 03/31/18 11:24 PM  Result Value Ref Range   Sodium 133 (L) 135 - 145 mmol/L   Potassium 3.4 (L) 3.5 - 5.1 mmol/L   Chloride 105 98 - 111 mmol/L   CO2 17 (L) 22 - 32 mmol/L   Glucose, Bld 105 (H) 70 - 99 mg/dL   BUN 11 6 - 20 mg/dL   Creatinine, Ser 7.82 0.44 - 1.00 mg/dL   Calcium 9.0 8.9 - 95.6 mg/dL   Total Protein 7.1 6.5 - 8.1 g/dL   Albumin 3.0 (L) 3.5 - 5.0 g/dL   AST 25 15 - 41 U/L   ALT 23 0 - 44 U/L   Alkaline Phosphatase 141 (H) 38 -  126 U/L   Total Bilirubin 0.6 0.3 - 1.2 mg/dL   GFR calc non Af Amer >60 >60 mL/min   GFR calc Af Amer >60 >60 mL/min   Anion gap 11 5 - 15  Lipase, blood     Status: None   Collection Time: 03/31/18 11:24 PM  Result Value Ref Range   Lipase 35 11 - 51 U/L    Imaging:  No results found.  MAU Course/MDM: I have ordered labs and reviewed results. LFTs and lipase normal  WBC normal for pregnancy NST reviewed, category I Levsin given with good relief of abdominal pain.  Discussed the colicky pain is likely due to diarrheal illness.  Will likely run its course soon.  Advise starting probiotics today  Assessment: SIUP at [redacted]w[redacted]d Diarrhea, unspecified type - Plan: Discharge patient  Chronic RUQ pain - Plan: Discharge patient  Uterine irritability - Plan: Discharge patient  Gastroenteritis - Plan: Discharge patient   Plan: Discharge home Rx a few Levsin tablets for use with colicky pain Start probiotics today.  Maintain hydration and advance diet as tolerated Preterm Labor precautions and fetal kick counts Follow up in Office for prenatal visits and recheck of status  Encouraged to return here or to other Urgent Care/ED if she develops worsening of symptoms, increase in pain, fever, or other concerning symptoms.   Pt stable at time of discharge.  Wynelle Bourgeois CNM, MSN Certified Nurse-Midwife 03/31/2018 10:56 PM

## 2018-03-31 NOTE — MAU Note (Signed)
Was d/c'd Sunday from [facility] d/t PTL.  On procardia, having N/V.  Having upper right sided abdominal pain.  Has had gallbladder issues in the past and is worried that could be it.  Took tylenol for the pain tonight around 2000 with minimal relief.  No VB.  Had some thin discharge earlier tonight.  + FM.  Not noticing any CTX.

## 2018-03-31 NOTE — Telephone Encounter (Signed)
LMOVM that Mindy Bright stated she could have gallbladder U/S.  If feeling worse she needed to be seen.  Advised to return call.

## 2018-03-31 NOTE — MAU Note (Signed)
Pt reports having had 6 bouts of watery diarrhea this past Monday and one bout tues, none wed then 4 bouts of watery  diarrhea today. The RUQ pain started 30 min after she ate and apple and some chicken noodle soup and has been ongoing since 4pm. Has lessened from an 8 to a 4 on the pain scale during the drive from [place] to [place].

## 2018-04-01 DIAGNOSIS — R1011 Right upper quadrant pain: Secondary | ICD-10-CM | POA: Diagnosis not present

## 2018-04-01 DIAGNOSIS — Z3A34 34 weeks gestation of pregnancy: Secondary | ICD-10-CM

## 2018-04-01 DIAGNOSIS — O26893 Other specified pregnancy related conditions, third trimester: Secondary | ICD-10-CM

## 2018-04-01 LAB — CBC WITH DIFFERENTIAL/PLATELET
Basophils Absolute: 0 10*3/uL (ref 0.0–0.1)
Basophils Relative: 0 %
EOS ABS: 0.2 10*3/uL (ref 0.0–0.7)
EOS PCT: 2 %
HCT: 33.2 % — ABNORMAL LOW (ref 36.0–46.0)
Hemoglobin: 11.5 g/dL — ABNORMAL LOW (ref 12.0–15.0)
Lymphocytes Relative: 22 %
Lymphs Abs: 2.9 10*3/uL (ref 0.7–4.0)
MCH: 29.6 pg (ref 26.0–34.0)
MCHC: 34.6 g/dL (ref 30.0–36.0)
MCV: 85.3 fL (ref 78.0–100.0)
MONO ABS: 0.7 10*3/uL (ref 0.1–1.0)
MONOS PCT: 5 %
Neutro Abs: 9.5 10*3/uL (ref 1.7–7.7)
Neutrophils Relative %: 71 %
PLATELETS: 296 10*3/uL (ref 150–400)
RBC: 3.89 MIL/uL (ref 3.87–5.11)
RDW: 13 % (ref 11.5–15.5)
WBC: 13.3 10*3/uL — AB (ref 4.0–10.5)

## 2018-04-01 MED ORDER — HYOSCYAMINE SULFATE SL 0.125 MG SL SUBL: 1.0000 | SUBLINGUAL_TABLET | Freq: Two times a day (BID) | SUBLINGUAL | 0 refills | Status: DC | PRN

## 2018-04-01 MED ORDER — HYOSCYAMINE SULFATE 0.125 MG PO TABS
0.1250 mg | ORAL_TABLET | Freq: Once | ORAL | Status: AC
  Administered 2018-04-01: 0.125 mg via ORAL
  Filled 2018-04-01: qty 1

## 2018-04-01 MED ORDER — NIFEDIPINE 10 MG PO CAPS: 10.0000 mg | ORAL_CAPSULE | ORAL | Status: DC | PRN

## 2018-04-01 NOTE — Discharge Instructions (Signed)

## 2018-04-05 ENCOUNTER — Ambulatory Visit (INDEPENDENT_AMBULATORY_CARE_PROVIDER_SITE_OTHER): Payer: BLUE CROSS/BLUE SHIELD | Admitting: Obstetrics & Gynecology

## 2018-04-05 ENCOUNTER — Encounter: Payer: Self-pay | Admitting: Obstetrics & Gynecology

## 2018-04-05 ENCOUNTER — Other Ambulatory Visit: Payer: Self-pay

## 2018-04-05 VITALS — BP 108/65 | HR 76 | Wt 131.0 lb

## 2018-04-05 DIAGNOSIS — Z1389 Encounter for screening for other disorder: Secondary | ICD-10-CM

## 2018-04-05 DIAGNOSIS — Z3A34 34 weeks gestation of pregnancy: Secondary | ICD-10-CM

## 2018-04-05 DIAGNOSIS — K828 Other specified diseases of gallbladder: Secondary | ICD-10-CM

## 2018-04-05 DIAGNOSIS — Z331 Pregnant state, incidental: Secondary | ICD-10-CM

## 2018-04-05 DIAGNOSIS — O0993 Supervision of high risk pregnancy, unspecified, third trimester: Secondary | ICD-10-CM

## 2018-04-05 LAB — POCT URINALYSIS DIPSTICK OB
Blood, UA: NEGATIVE
GLUCOSE, UA: NEGATIVE
KETONES UA: NEGATIVE
Leukocytes, UA: NEGATIVE
Nitrite, UA: NEGATIVE
POC,PROTEIN,UA: NEGATIVE

## 2018-04-05 MED ORDER — PROMETHAZINE HCL 25 MG PO TABS: 25.0000 mg | ORAL_TABLET | Freq: Four times a day (QID) | ORAL | 1 refills | Status: DC | PRN

## 2018-04-05 MED ORDER — PANTOPRAZOLE SODIUM 40 MG PO TBEC: 40.0000 mg | DELAYED_RELEASE_TABLET | Freq: Every day | ORAL | 11 refills | Status: DC

## 2018-04-05 NOTE — Progress Notes (Signed)
Patient ID: Mindy Bright, female   DOB: [birth year], 37 y.o.   MRN: [id number]   [facility] PREGNANCY VISIT Patient name: Mindy Bright MRN [id number]  Date of birth: 1980-08-17 Chief Complaint:   Routine Prenatal Visit  History of Present Illness:   Mindy Bright is a 37 y.o. G65P1001 female at [redacted]w[redacted]d with an Estimated Date of Delivery: 05/12/18 being seen today for ongoing management of a high-risk pregnancy complicated by PTL.  Today she reports postprandiol epigastric pain and nausea plus contractions if she delays her procardia. Contractions: Irregular. Vag. Bleeding: None.  Movement: Present. denies leaking of fluid.  Review of Systems:   Pertinent items are noted in HPI Denies abnormal vaginal discharge w/ itching/odor/irritation, headaches, visual changes, shortness of breath, chest pain, abdominal pain, severe nausea/vomiting, or problems with urination or bowel movements unless otherwise stated above. Pertinent History Reviewed:  Reviewed past medical,surgical, social, obstetrical and family history.  Reviewed problem list, medications and allergies. Physical Assessment:   Vitals:   04/05/18 1058  BP: 108/65  Pulse: 76  Weight: 131 lb (59.4 kg)  Body mass index is 23.96 kg/m.           Physical Examination:   General appearance: alert, well appearing, and in no distress  Mental status: alert, oriented to person, place, and time  Skin: warm & dry   Extremities: Edema: None    Cardiovascular: normal heart rate noted  Respiratory: normal respiratory effort, no distress  Abdomen: gravid, soft, non-tender  Pelvic: Cervical exam deferred         Fetal Status:     Movement: Present    Fetal Surveillance Testing today: FHR 146   Results for orders placed or performed in visit on 04/05/18 (from the past 24 hour(s))  POC Urinalysis Dipstick OB   Collection Time: 04/05/18 10:59 AM  Result Value Ref Range   Color, UA     Clarity, UA     Glucose, UA Negative Negative   Bilirubin, UA     Ketones, UA neg    Spec Grav, UA     Blood, UA neg    pH, UA     POC Protein UA Negative Negative, Trace   Urobilinogen, UA     Nitrite, UA neg    Leukocytes, UA Negative Negative   Appearance     Odor      Assessment & Plan:  1) High-risk pregnancy G2P1001 at [redacted]w[redacted]d with an Estimated Date of Delivery: 05/12/18   2) PTL, stable, on procardia  3) Postprandiol pain nausea< biliary dyskinesis, unstable  Meds:  Meds ordered this encounter  Medications  . promethazine (PHENERGAN) 25 MG tablet    Sig: Take 1 tablet (25 mg total) by mouth every 6 (six) hours as needed for nausea or vomiting.    Dispense:  30 tablet    Refill:  1  . pantoprazole (PROTONIX) 40 MG tablet    Sig: Take 1 tablet (40 mg total) by mouth daily.    Dispense:  30 tablet    Refill:  11    Labs/procedures today:   Treatment Plan:  Begin protonix and phenergan when she gets pain which may help her biliary dyskinesia  Reviewed: Preterm labor symptoms and general obstetric precautions including but not limited to vaginal bleeding, contractions, leaking of fluid and fetal movement were reviewed in detail with the patient.  All questions were answered.  Follow-up: Return in about 2 weeks (around 04/19/2018) for HROB.  Orders Placed This Encounter  Procedures  . POC Urinalysis Dipstick OB   Lazaro Arms  04/05/2018 4:17 PM

## 2018-04-08 ENCOUNTER — Telehealth: Payer: Self-pay | Admitting: *Deleted

## 2018-04-08 ENCOUNTER — Encounter: Payer: BLUE CROSS/BLUE SHIELD | Admitting: Obstetrics & Gynecology

## 2018-04-08 MED ORDER — POLYSACCHARIDE IRON COMPLEX 150 MG PO CAPS: 150.0000 mg | ORAL_CAPSULE | Freq: Every day | ORAL | 3 refills | Status: DC

## 2018-04-08 NOTE — Telephone Encounter (Signed)
Patient informed Nu-iron was sent.

## 2018-04-08 NOTE — Telephone Encounter (Signed)
Patient states she is certain the iron tablets are causing her upset stomach. She stopped taking them for a couple of days and it seemed to get better.  States a few years ago she was taking NuIron which did not seem to irritate her as much.  Is this something she could switch too? Please advise.

## 2018-04-13 ENCOUNTER — Other Ambulatory Visit: Payer: Self-pay | Admitting: Advanced Practice Midwife

## 2018-04-13 ENCOUNTER — Encounter: Payer: Self-pay | Admitting: *Deleted

## 2018-04-19 ENCOUNTER — Ambulatory Visit (INDEPENDENT_AMBULATORY_CARE_PROVIDER_SITE_OTHER): Payer: BLUE CROSS/BLUE SHIELD | Admitting: Obstetrics & Gynecology

## 2018-04-19 ENCOUNTER — Encounter: Payer: Self-pay | Admitting: Obstetrics & Gynecology

## 2018-04-19 VITALS — BP 112/64 | HR 74 | Wt 139.0 lb

## 2018-04-19 DIAGNOSIS — Z23 Encounter for immunization: Secondary | ICD-10-CM | POA: Diagnosis not present

## 2018-04-19 DIAGNOSIS — Z3A36 36 weeks gestation of pregnancy: Secondary | ICD-10-CM

## 2018-04-19 DIAGNOSIS — Z1389 Encounter for screening for other disorder: Secondary | ICD-10-CM

## 2018-04-19 DIAGNOSIS — O0993 Supervision of high risk pregnancy, unspecified, third trimester: Secondary | ICD-10-CM | POA: Diagnosis not present

## 2018-04-19 DIAGNOSIS — Z3493 Encounter for supervision of normal pregnancy, unspecified, third trimester: Secondary | ICD-10-CM

## 2018-04-19 DIAGNOSIS — Z331 Pregnant state, incidental: Secondary | ICD-10-CM

## 2018-04-19 LAB — POCT URINALYSIS DIPSTICK OB
Blood, UA: NEGATIVE
Glucose, UA: NEGATIVE
Ketones, UA: NEGATIVE
LEUKOCYTES UA: NEGATIVE
NITRITE UA: NEGATIVE
PROTEIN: NEGATIVE

## 2018-04-19 LAB — OB RESULTS CONSOLE GBS: GBS: NEGATIVE

## 2018-04-19 NOTE — Progress Notes (Signed)
   LOW-RISK PREGNANCY VISIT Patient name: Mindy Bright MRN [id number]  Date of birth: Oct 09, 1980 Chief Complaint:   Routine Prenatal Visit  History of Present Illness:   Mindy Bright is a 37 y.o. G75P1001 female at [redacted]w[redacted]d with an Estimated Date of Delivery: 05/12/18 being seen today for ongoing management of a low-risk pregnancy.  Today she reports no complaints. Contractions: Not present. Vag. Bleeding: None.  Movement: Present. denies leaking of fluid. Review of Systems:   Pertinent items are noted in HPI Denies abnormal vaginal discharge w/ itching/odor/irritation, headaches, visual changes, shortness of breath, chest pain, abdominal pain, severe nausea/vomiting, or problems with urination or bowel movements unless otherwise stated above. Pertinent History Reviewed:  Reviewed past medical,surgical, social, obstetrical and family history.  Reviewed problem list, medications and allergies. Physical Assessment:   Vitals:   04/19/18 1024  BP: 112/64  Pulse: 74  Weight: 139 lb (63 kg)  Body mass index is 25.42 kg/m.        Physical Examination:   General appearance: Well appearing, and in no distress  Mental status: Alert, oriented to person, place, and time  Skin: Warm & dry  Cardiovascular: Normal heart rate noted  Respiratory: Normal respiratory effort, no distress  Abdomen: Soft, gravid, nontender  Pelvic: Cervical exam performed       3-4/50/-2  Extremities: Edema: None  Fetal Status: Fetal Heart Rate (bpm): 145 Fundal Height: 34 cm Movement: Present    Results for orders placed or performed in visit on 04/19/18 (from the past 24 hour(s))  POC Urinalysis Dipstick OB   Collection Time: 04/19/18 10:29 AM  Result Value Ref Range   Color, UA     Clarity, UA     Glucose, UA Negative Negative   Bilirubin, UA     Ketones, UA neg    Spec Grav, UA     Blood, UA neg    pH, UA     POC Protein UA Negative Negative, Trace   Urobilinogen, UA     Nitrite, UA neg    Leukocytes, UA Negative Negative   Appearance     Odor      Assessment & Plan:  1) Low-risk pregnancy G2P1001 at [redacted]w[redacted]d with an Estimated Date of Delivery: 05/12/18   2) PTL, now almost 37 weeks   Meds: No orders of the defined types were placed in this encounter.  Labs/procedures today: cultures done  Plan:  Continue routine obstetrical care   Reviewed: Term labor symptoms and general obstetric precautions including but not limited to vaginal bleeding, contractions, leaking of fluid and fetal movement were reviewed in detail with the patient.  All questions were answered  Follow-up: Return in about 1 week (around 04/26/2018) for LROB.  Orders Placed This Encounter  Procedures  . GC/Chlamydia Probe Amp([facility])  . Strep Gp B NAA+Rflx  . POC Urinalysis Dipstick OB   Lazaro Arms  04/19/2018 10:42 AM

## 2018-04-20 DIAGNOSIS — Z029 Encounter for administrative examinations, unspecified: Secondary | ICD-10-CM

## 2018-04-21 LAB — GC/CHLAMYDIA PROBE AMP
Chlamydia trachomatis, NAA: NEGATIVE
Neisseria gonorrhoeae by PCR: NEGATIVE

## 2018-04-21 LAB — STREP GP B NAA+RFLX: Strep Gp B NAA+Rflx: NEGATIVE

## 2018-04-26 ENCOUNTER — Telehealth: Payer: Self-pay | Admitting: Obstetrics & Gynecology

## 2018-04-26 ENCOUNTER — Encounter: Payer: Self-pay | Admitting: Women's Health

## 2018-04-26 ENCOUNTER — Ambulatory Visit (INDEPENDENT_AMBULATORY_CARE_PROVIDER_SITE_OTHER): Payer: BLUE CROSS/BLUE SHIELD | Admitting: Women's Health

## 2018-04-26 VITALS — BP 126/69 | HR 68 | Wt 137.8 lb

## 2018-04-26 DIAGNOSIS — Z3483 Encounter for supervision of other normal pregnancy, third trimester: Secondary | ICD-10-CM

## 2018-04-26 DIAGNOSIS — O26843 Uterine size-date discrepancy, third trimester: Secondary | ICD-10-CM

## 2018-04-26 DIAGNOSIS — Z3A37 37 weeks gestation of pregnancy: Secondary | ICD-10-CM

## 2018-04-26 DIAGNOSIS — Z1389 Encounter for screening for other disorder: Secondary | ICD-10-CM

## 2018-04-26 DIAGNOSIS — Z331 Pregnant state, incidental: Secondary | ICD-10-CM

## 2018-04-26 LAB — POCT URINALYSIS DIPSTICK OB
Blood, UA: NEGATIVE
GLUCOSE, UA: NEGATIVE
KETONES UA: NEGATIVE
Nitrite, UA: NEGATIVE
PROTEIN: NEGATIVE

## 2018-04-26 NOTE — Progress Notes (Signed)
   LOW-RISK PREGNANCY VISIT Patient name: Mindy Bright MRN [id number]  Date of birth: [birth year] Chief Complaint:   Routine Prenatal Visit  History of Present Illness:   Mindy Bright is a 37 y.o. G36P1001 female at [redacted]w[redacted]d with an Estimated Date of Delivery: 05/12/18 being seen today for ongoing management of a low-risk pregnancy.  Today she reports irregular uc's, will be regular then stops. Concerns her b/c she lives 1hr from hospital. Contractions: Irritability. Vag. Bleeding: None.  Movement: Present. denies leaking of fluid. Review of Systems:   Pertinent items are noted in HPI Denies abnormal vaginal discharge w/ itching/odor/irritation, headaches, visual changes, shortness of breath, chest pain, abdominal pain, severe nausea/vomiting, or problems with urination or bowel movements unless otherwise stated above. Pertinent History Reviewed:  Reviewed past medical,surgical, social, obstetrical and family history.  Reviewed problem list, medications and allergies. Physical Assessment:   Vitals:   04/26/18 1115  BP: 126/69  Pulse: 68  Weight: 137 lb 12.8 oz (62.5 kg)  Body mass index is 25.2 kg/m.        Physical Examination:   General appearance: Well appearing, and in no distress  Mental status: Alert, oriented to person, place, and time  Skin: Warm & dry  Cardiovascular: Normal heart rate noted  Respiratory: Normal respiratory effort, no distress  Abdomen: Soft, gravid, nontender  Pelvic: Cervical exam performed  Dilation: 4 Effacement (%): 70 Station: -2  Extremities: Edema: None  Fetal Status: Fetal Heart Rate (bpm): 128 Fundal Height: 34 cm Movement: Present Presentation: Vertex  Results for orders placed or performed in visit on 04/26/18 (from the past 24 hour(s))  POC Urinalysis Dipstick OB   Collection Time: 04/26/18 11:16 AM  Result Value Ref Range   Color, UA     Clarity, UA     Glucose, UA Negative Negative   Bilirubin, UA     Ketones, UA neg    Spec  Grav, UA     Blood, UA neg    pH, UA     POC Protein UA Negative Negative, Trace   Urobilinogen, UA     Nitrite, UA neg    Leukocytes, UA Trace (A) Negative   Appearance     Odor      Assessment & Plan:  1) Low-risk pregnancy G2P1001 at [redacted]w[redacted]d with an Estimated Date of Delivery: 05/12/18   2) Uterine size <dates, h/o 5lb13oz (4098J) baby @ 39wks, will get efw/afi u/s   Meds: No orders of the defined types were placed in this encounter.  Labs/procedures today: sve  Plan:  Continue routine obstetrical care   Reviewed: Term labor symptoms and general obstetric precautions including but not limited to vaginal bleeding, contractions, leaking of fluid and fetal movement were reviewed in detail with the patient.  All questions were answered  Follow-up: Return for asap efw/afi u/s (no visit), then 1wk LROB.  Orders Placed This Encounter  Procedures  . US OB Follow Up  . POC Urinalysis Dipstick OB   Cheral Marker CNM, [facility] 04/26/2018 11:35 AM

## 2018-04-26 NOTE — Telephone Encounter (Signed)
LMOVM that she is scheduled for the first available and will also see Dr Despina Hidden at that visit so it will be looked at if she will need to be seen more frequently or if there is a need for induction.  Advised to call back if she had further questions.

## 2018-04-26 NOTE — Telephone Encounter (Signed)
Patient called, stated that she was seen this morning and that Selena Batten said she was not measuring where she should be.  She stated that Selena Batten wanted her to have an U/S asap,she was given an appointment for 05/02/18.  She's concerned since Selena Batten told her asap, is this soon enough.  She is concerned that if something is wrong that may be too long to wait.  She wanted to speak to a nurse.  [phone]

## 2018-04-26 NOTE — Patient Instructions (Signed)
Mindy Bright, I greatly value your feedback.  If you receive a survey following your visit with [place] today, we appreciate you taking the time to fill it out.  Thanks, Mindy Bright, CNM, WHNP-BC   Call the office [phone]) or go to [facility] if:  You begin to have strong, frequent contractions  Your water breaks.  Sometimes it is a big gush of fluid, sometimes it is just a trickle that keeps getting your panties wet or running down your legs  You have vaginal bleeding.  It is normal to have a small amount of spotting if your cervix was checked.   You don't feel your baby moving like normal.  If you don't, get you something to eat and drink and lay down and focus on feeling your baby move.  You should feel at least 10 movements in 2 hours.  If you don't, you should call the office or go to [facility].     [facility] Contractions Contractions of the uterus can occur throughout pregnancy, but they are not always a sign that you are in labor. You may have practice contractions called Braxton Hicks contractions. These false labor contractions are sometimes confused with true labor. What are Mindy Bright contractions? Braxton Hicks contractions are tightening movements that occur in the muscles of the uterus before labor. Unlike true labor contractions, these contractions do not result in opening (dilation) and thinning of the cervix. Toward the end of pregnancy (32-34 weeks), Braxton Hicks contractions can happen more often and may become stronger. These contractions are sometimes difficult to tell apart from true labor because they can be very uncomfortable. You should not feel embarrassed if you go to the hospital with false labor. Sometimes, the only way to tell if you are in true labor is for your health care provider to look for changes in the cervix. The health care provider will do a physical exam and may monitor your contractions. If you are not in true labor, the exam should  show that your cervix is not dilating and your water has not broken. If there are other health problems associated with your pregnancy, it is completely safe for you to be sent home with false labor. You may continue to have Braxton Hicks contractions until you go into true labor. How to tell the difference between true labor and false labor True labor  Contractions last 30-70 seconds.  Contractions become very regular.  Discomfort is usually felt in the top of the uterus, and it spreads to the lower abdomen and low back.  Contractions do not go away with walking.  Contractions usually become more intense and increase in frequency.  The cervix dilates and gets thinner. False labor  Contractions are usually shorter and not as strong as true labor contractions.  Contractions are usually irregular.  Contractions are often felt in the front of the lower abdomen and in the groin.  Contractions may go away when you walk around or change positions while lying down.  Contractions get weaker and are shorter-lasting as time goes on.  The cervix usually does not dilate or become thin. Follow these instructions at home:  Take over-the-counter and prescription medicines only as told by your health care provider.  Keep up with your usual exercises and follow other instructions from your health care provider.  Eat and drink lightly if you think you are going into labor.  If Braxton Hicks contractions are making you uncomfortable: ? Change your position from lying  down or resting to walking, or change from walking to resting. ? Sit and rest in a tub of warm water. ? Drink enough fluid to keep your urine pale yellow. Dehydration may cause these contractions. ? Do slow and deep breathing several times an hour.  Keep all follow-up prenatal visits as told by your health care provider. This is important. Contact a health care provider if:  You have a fever.  You have continuous pain in  your abdomen. Get help right away if:  Your contractions become stronger, more regular, and closer together.  You have fluid leaking or gushing from your vagina.  You pass blood-tinged mucus (bloody show).  You have bleeding from your vagina.  You have low back pain that you never had before.  You feel your baby's head pushing down and causing pelvic pressure.  Your baby is not moving inside you as much as it used to. Summary  Contractions that occur before labor are called Braxton Hicks contractions, false labor, or practice contractions.  Braxton Hicks contractions are usually shorter, weaker, farther apart, and less regular than true labor contractions. True labor contractions usually become progressively stronger and regular and they become more frequent.  Manage discomfort from [facility] contractions by changing position, resting in a warm bath, drinking plenty of water, or practicing deep breathing. This information is not intended to replace advice given to you by your health care provider. Make sure you discuss any questions you have with your health care provider. Document Released: 10/29/2016 Document Revised: 10/29/2016 Document Reviewed: 10/29/2016 Elsevier Interactive Patient Education  2018 [employer].

## 2018-04-28 ENCOUNTER — Telehealth: Payer: Self-pay | Admitting: *Deleted

## 2018-04-28 NOTE — Telephone Encounter (Signed)
Patient states she has a brown discharge, no leaking and baby is active.  Informed patient it was old blood but if it became bright in color or heavy, to call us or go to [facility].  Pt verbalized understanding.

## 2018-05-02 ENCOUNTER — Ambulatory Visit (INDEPENDENT_AMBULATORY_CARE_PROVIDER_SITE_OTHER): Payer: BLUE CROSS/BLUE SHIELD | Admitting: Obstetrics & Gynecology

## 2018-05-02 ENCOUNTER — Ambulatory Visit (INDEPENDENT_AMBULATORY_CARE_PROVIDER_SITE_OTHER): Payer: BLUE CROSS/BLUE SHIELD

## 2018-05-02 VITALS — BP 117/74 | HR 74 | Wt 138.0 lb

## 2018-05-02 DIAGNOSIS — Z331 Pregnant state, incidental: Secondary | ICD-10-CM

## 2018-05-02 DIAGNOSIS — O26843 Uterine size-date discrepancy, third trimester: Secondary | ICD-10-CM

## 2018-05-02 DIAGNOSIS — Z3483 Encounter for supervision of other normal pregnancy, third trimester: Secondary | ICD-10-CM

## 2018-05-02 DIAGNOSIS — Z3A38 38 weeks gestation of pregnancy: Secondary | ICD-10-CM

## 2018-05-02 DIAGNOSIS — Z1389 Encounter for screening for other disorder: Secondary | ICD-10-CM

## 2018-05-02 LAB — POCT URINALYSIS DIPSTICK OB
Blood, UA: NEGATIVE
GLUCOSE, UA: NEGATIVE
Ketones, UA: NEGATIVE
LEUKOCYTES UA: NEGATIVE
Nitrite, UA: NEGATIVE
POC,PROTEIN,UA: NEGATIVE

## 2018-05-02 NOTE — Progress Notes (Signed)
   LOW-RISK PREGNANCY VISIT Patient name: Mindy Bright MRN [id number]  Date of birth: 1981-05-29 Chief Complaint:   Routine Prenatal Visit ([place] today)  History of Present Illness:   Mindy Bright is a 37 y.o. G65P1001 female at [redacted]w[redacted]d with an Estimated Date of Delivery: 05/12/18 being seen today for ongoing management of a low-risk pregnancy.  Today she reports pelvic pressure . Contractions: Irritability. Vag. Bleeding: None.  Movement: Present. denies leaking of fluid. Review of Systems:   Pertinent items are noted in HPI Denies abnormal vaginal discharge w/ itching/odor/irritation, headaches, visual changes, shortness of breath, chest pain, abdominal pain, severe nausea/vomiting, or problems with urination or bowel movements unless otherwise stated above. Pertinent History Reviewed:  Reviewed past medical,surgical, social, obstetrical and family history.  Reviewed problem list, medications and allergies. Physical Assessment:   Vitals:   05/02/18 1420  BP: 117/74  Pulse: 74  Weight: 138 lb (62.6 kg)  Body mass index is 25.24 kg/m.        Physical Examination:   General appearance: Well appearing, and in no distress  Mental status: Alert, oriented to person, place, and time  Skin: Warm & dry  Cardiovascular: Normal heart rate noted  Respiratory: Normal respiratory effort, no distress  Abdomen: Soft, gravid, nontender  Pelvic: Cervical exam performed       4/70/-1/vertex/midplane  Extremities: Edema: None  Fetal Status:     Movement: Present    Results for orders placed or performed in visit on 05/02/18 (from the past 24 hour(s))  POC Urinalysis Dipstick OB   Collection Time: 05/02/18  2:22 PM  Result Value Ref Range   Color, UA     Clarity, UA     Glucose, UA Negative Negative   Bilirubin, UA     Ketones, UA neg    Spec Grav, UA     Blood, UA neg    pH, UA     POC Protein UA Negative Negative, Trace   Urobilinogen, UA     Nitrite, UA neg    Leukocytes, UA  Negative Negative   Appearance     Odor      Assessment & Plan:  1) Low-risk pregnancy G2P1001 at [redacted]w[redacted]d with an Estimated Date of Delivery: 05/12/18   2) sonogram is normal with good efw   Meds: No orders of the defined types were placed in this encounter.  Labs/procedures today:   Plan:  Continue routine obstetrical care   Reviewed: Term labor symptoms and general obstetric precautions including but not limited to vaginal bleeding, contractions, leaking of fluid and fetal movement were reviewed in detail with the patient.  All questions were answered  Follow-up: Return in about 1 week (around 05/09/2018) for LROB.  Orders Placed This Encounter  Procedures  . POC Urinalysis Dipstick OB   Amaryllis Dyke Geniya Fulgham 05/02/2018 3:01 PM

## 2018-05-02 NOTE — Progress Notes (Signed)
Patient ID: Mindy Bright, female   DOB: [birth year], 37 y.o.   MRN: [id number] [place] 38+4 wks,cephalic,fhr 142 bpm,posterior placenta gr 3,afi 8.3 cm,EFW 3049 g 25%

## 2018-05-04 ENCOUNTER — Inpatient Hospital Stay (HOSPITAL_COMMUNITY)
Admission: AD | Admit: 2018-05-04 | Discharge: 2018-05-06 | DRG: 807 | Disposition: A | Discharge status: Home / Self Care | Payer: BLUE CROSS/BLUE SHIELD | Attending: Obstetrics and Gynecology | Admitting: Obstetrics and Gynecology

## 2018-05-04 ENCOUNTER — Inpatient Hospital Stay (HOSPITAL_COMMUNITY): Payer: BLUE CROSS/BLUE SHIELD | Admitting: Anesthesiology

## 2018-05-04 ENCOUNTER — Encounter (HOSPITAL_COMMUNITY): Payer: Self-pay

## 2018-05-04 ENCOUNTER — Other Ambulatory Visit: Payer: Self-pay

## 2018-05-04 ENCOUNTER — Telehealth: Payer: Self-pay | Admitting: *Deleted

## 2018-05-04 DIAGNOSIS — D649 Anemia, unspecified: Secondary | ICD-10-CM | POA: Diagnosis not present

## 2018-05-04 DIAGNOSIS — Z3A38 38 weeks gestation of pregnancy: Secondary | ICD-10-CM

## 2018-05-04 DIAGNOSIS — O99284 Endocrine, nutritional and metabolic diseases complicating childbirth: Secondary | ICD-10-CM | POA: Diagnosis present

## 2018-05-04 DIAGNOSIS — Z880 Allergy status to penicillin: Secondary | ICD-10-CM

## 2018-05-04 DIAGNOSIS — K219 Gastro-esophageal reflux disease without esophagitis: Secondary | ICD-10-CM | POA: Diagnosis not present

## 2018-05-04 DIAGNOSIS — O99013 Anemia complicating pregnancy, third trimester: Secondary | ICD-10-CM | POA: Diagnosis present

## 2018-05-04 DIAGNOSIS — O9962 Diseases of the digestive system complicating childbirth: Secondary | ICD-10-CM | POA: Diagnosis not present

## 2018-05-04 DIAGNOSIS — E039 Hypothyroidism, unspecified: Secondary | ICD-10-CM | POA: Diagnosis present

## 2018-05-04 DIAGNOSIS — Z3483 Encounter for supervision of other normal pregnancy, third trimester: Secondary | ICD-10-CM | POA: Diagnosis not present

## 2018-05-04 DIAGNOSIS — O9902 Anemia complicating childbirth: Principal | ICD-10-CM | POA: Diagnosis present

## 2018-05-04 DIAGNOSIS — Z87891 Personal history of nicotine dependence: Secondary | ICD-10-CM | POA: Diagnosis not present

## 2018-05-04 LAB — CBC
HCT: 34.9 % — ABNORMAL LOW (ref 36.0–46.0)
Hemoglobin: 11.5 g/dL — ABNORMAL LOW (ref 12.0–15.0)
MCH: 28.8 pg (ref 26.0–34.0)
MCHC: 33 g/dL (ref 30.0–36.0)
MCV: 87.5 fL (ref 80.0–100.0)
Platelets: 243 10*3/uL (ref 150–400)
RBC: 3.99 MIL/uL (ref 3.87–5.11)
RDW: 13.7 % (ref 11.5–15.5)
WBC: 13 10*3/uL — ABNORMAL HIGH (ref 4.0–10.5)
nRBC: 0 % (ref 0.0–0.2)

## 2018-05-04 LAB — TYPE AND SCREEN
ABO/RH(D): O POS
ANTIBODY SCREEN: NEGATIVE

## 2018-05-04 MED ORDER — FLEET ENEMA 7-19 GM/118ML RE ENEM: 1.0000 | ENEMA | RECTAL | Status: DC | PRN

## 2018-05-04 MED ORDER — PHENYLEPHRINE 40 MCG/ML (10ML) SYRINGE FOR IV PUSH (FOR BLOOD PRESSURE SUPPORT)
80.0000 ug | PREFILLED_SYRINGE | INTRAVENOUS | Status: DC | PRN
  Filled 2018-05-04: qty 5
  Filled 2018-05-04: qty 10

## 2018-05-04 MED ORDER — DIPHENHYDRAMINE HCL 25 MG PO CAPS: 25.0000 mg | ORAL_CAPSULE | Freq: Four times a day (QID) | ORAL | Status: DC | PRN

## 2018-05-04 MED ORDER — PANTOPRAZOLE SODIUM 40 MG PO TBEC
40.0000 mg | DELAYED_RELEASE_TABLET | Freq: Every day | ORAL | Status: DC
  Administered 2018-05-04 – 2018-05-05 (×2): 40 mg via ORAL
  Filled 2018-05-04 (×2): qty 1

## 2018-05-04 MED ORDER — COCONUT OIL OIL
1.0000 "application " | TOPICAL_OIL | Status: DC | PRN
  Filled 2018-05-04: qty 120

## 2018-05-04 MED ORDER — SIMETHICONE 80 MG PO CHEW: 80.0000 mg | CHEWABLE_TABLET | ORAL | Status: DC | PRN

## 2018-05-04 MED ORDER — OXYCODONE-ACETAMINOPHEN 5-325 MG PO TABS: 2.0000 | ORAL_TABLET | ORAL | Status: DC | PRN

## 2018-05-04 MED ORDER — SOD CITRATE-CITRIC ACID 500-334 MG/5ML PO SOLN: 30.0000 mL | ORAL | Status: DC | PRN

## 2018-05-04 MED ORDER — MEASLES, MUMPS & RUBELLA VAC IJ SOLR
0.5000 mL | Freq: Once | INTRAMUSCULAR | Status: DC
  Filled 2018-05-04: qty 0.5

## 2018-05-04 MED ORDER — SENNOSIDES-DOCUSATE SODIUM 8.6-50 MG PO TABS
2.0000 | ORAL_TABLET | ORAL | Status: DC
  Administered 2018-05-04: 2 via ORAL
  Filled 2018-05-04: qty 2

## 2018-05-04 MED ORDER — DIPHENHYDRAMINE HCL 50 MG/ML IJ SOLN: 12.5000 mg | INTRAMUSCULAR | Status: DC | PRN

## 2018-05-04 MED ORDER — BISACODYL 10 MG RE SUPP: 10.0000 mg | Freq: Every day | RECTAL | Status: DC | PRN

## 2018-05-04 MED ORDER — EPHEDRINE 5 MG/ML INJ
10.0000 mg | INTRAVENOUS | Status: DC | PRN
  Filled 2018-05-04: qty 2

## 2018-05-04 MED ORDER — FLEET ENEMA 7-19 GM/118ML RE ENEM: 1.0000 | ENEMA | Freq: Every day | RECTAL | Status: DC | PRN

## 2018-05-04 MED ORDER — BENZOCAINE-MENTHOL 20-0.5 % EX AERO: 1.0000 "application " | INHALATION_SPRAY | CUTANEOUS | Status: DC | PRN

## 2018-05-04 MED ORDER — ONDANSETRON HCL 4 MG/2ML IJ SOLN: 4.0000 mg | INTRAMUSCULAR | Status: DC | PRN

## 2018-05-04 MED ORDER — LIDOCAINE HCL (PF) 1 % IJ SOLN
30.0000 mL | INTRAMUSCULAR | Status: DC | PRN
  Filled 2018-05-04: qty 30

## 2018-05-04 MED ORDER — ZOLPIDEM TARTRATE 5 MG PO TABS: 5.0000 mg | ORAL_TABLET | Freq: Every evening | ORAL | Status: DC | PRN

## 2018-05-04 MED ORDER — SODIUM CHLORIDE 0.9% FLUSH: 3.0000 mL | Freq: Two times a day (BID) | INTRAVENOUS | Status: DC

## 2018-05-04 MED ORDER — PHENYLEPHRINE 40 MCG/ML (10ML) SYRINGE FOR IV PUSH (FOR BLOOD PRESSURE SUPPORT)
80.0000 ug | PREFILLED_SYRINGE | INTRAVENOUS | Status: DC | PRN
  Filled 2018-05-04: qty 5

## 2018-05-04 MED ORDER — WITCH HAZEL-GLYCERIN EX PADS: 1.0000 "application " | MEDICATED_PAD | CUTANEOUS | Status: DC | PRN

## 2018-05-04 MED ORDER — OXYTOCIN BOLUS FROM INFUSION
500.0000 mL | Freq: Once | INTRAVENOUS | Status: AC
  Administered 2018-05-04: 500 mL via INTRAVENOUS

## 2018-05-04 MED ORDER — LACTATED RINGERS IV SOLN: 500.0000 mL | INTRAVENOUS | Status: DC | PRN

## 2018-05-04 MED ORDER — IBUPROFEN 600 MG PO TABS
600.0000 mg | ORAL_TABLET | Freq: Four times a day (QID) | ORAL | Status: DC
  Administered 2018-05-04 – 2018-05-06 (×7): 600 mg via ORAL
  Filled 2018-05-04 (×7): qty 1

## 2018-05-04 MED ORDER — OXYCODONE-ACETAMINOPHEN 5-325 MG PO TABS: 1.0000 | ORAL_TABLET | ORAL | Status: DC | PRN

## 2018-05-04 MED ORDER — LIDOCAINE HCL (PF) 1 % IJ SOLN
INTRAMUSCULAR | Status: DC | PRN
  Administered 2018-05-04: 10 mL via EPIDURAL

## 2018-05-04 MED ORDER — LEVOTHYROXINE SODIUM 50 MCG PO TABS
50.0000 ug | ORAL_TABLET | Freq: Every day | ORAL | Status: DC
  Administered 2018-05-04 – 2018-05-05 (×2): 50 ug via ORAL
  Filled 2018-05-04 (×3): qty 1

## 2018-05-04 MED ORDER — ACETAMINOPHEN 325 MG PO TABS
650.0000 mg | ORAL_TABLET | ORAL | Status: DC | PRN
  Administered 2018-05-05: 650 mg via ORAL
  Filled 2018-05-04: qty 2

## 2018-05-04 MED ORDER — LACTATED RINGERS IV SOLN
INTRAVENOUS | Status: DC
  Administered 2018-05-04: 02:00:00 via INTRAVENOUS

## 2018-05-04 MED ORDER — OXYTOCIN 40 UNITS IN LACTATED RINGERS INFUSION - SIMPLE MED
2.5000 [IU]/h | INTRAVENOUS | Status: DC
  Filled 2018-05-04: qty 1000

## 2018-05-04 MED ORDER — PRENATAL MULTIVITAMIN CH
1.0000 | ORAL_TABLET | Freq: Every day | ORAL | Status: DC
  Administered 2018-05-04 – 2018-05-05 (×2): 1 via ORAL
  Filled 2018-05-04 (×2): qty 1

## 2018-05-04 MED ORDER — LACTATED RINGERS IV SOLN
500.0000 mL | Freq: Once | INTRAVENOUS | Status: AC
  Administered 2018-05-04 (×2): 500 mL via INTRAVENOUS

## 2018-05-04 MED ORDER — SODIUM CHLORIDE 0.9 % IV SOLN: 250.0000 mL | INTRAVENOUS | Status: DC | PRN

## 2018-05-04 MED ORDER — ACETAMINOPHEN 325 MG PO TABS: 650.0000 mg | ORAL_TABLET | ORAL | Status: DC | PRN

## 2018-05-04 MED ORDER — ONDANSETRON HCL 4 MG/2ML IJ SOLN
4.0000 mg | Freq: Four times a day (QID) | INTRAMUSCULAR | Status: DC | PRN
  Administered 2018-05-04: 4 mg via INTRAVENOUS
  Filled 2018-05-04: qty 2

## 2018-05-04 MED ORDER — ONDANSETRON HCL 4 MG PO TABS: 4.0000 mg | ORAL_TABLET | ORAL | Status: DC | PRN

## 2018-05-04 MED ORDER — TETANUS-DIPHTH-ACELL PERTUSSIS 5-2.5-18.5 LF-MCG/0.5 IM SUSP: 0.5000 mL | Freq: Once | INTRAMUSCULAR | Status: DC

## 2018-05-04 MED ORDER — DIBUCAINE 1 % RE OINT: 1.0000 "application " | TOPICAL_OINTMENT | RECTAL | Status: DC | PRN

## 2018-05-04 MED ORDER — SODIUM CHLORIDE 0.9% FLUSH: 3.0000 mL | INTRAVENOUS | Status: DC | PRN

## 2018-05-04 MED ORDER — FENTANYL 2.5 MCG/ML BUPIVACAINE 1/10 % EPIDURAL INFUSION (WH - ANES)
14.0000 mL/h | INTRAMUSCULAR | Status: DC | PRN
  Administered 2018-05-04: 14 mL/h via EPIDURAL
  Filled 2018-05-04: qty 100

## 2018-05-04 NOTE — Anesthesia Postprocedure Evaluation (Signed)
Anesthesia Post Note  Patient: Mindy Bright  Procedure(s) Performed: AN AD HOC LABOR EPIDURAL     Patient location during evaluation: Mother Baby Anesthesia Type: Epidural Level of consciousness: awake Pain management: satisfactory to patient Vital Signs Assessment: post-procedure vital signs reviewed and stable Respiratory status: spontaneous breathing Cardiovascular status: stable Anesthetic complications: no    Last Vitals:  Vitals:   05/04/18 1045 05/04/18 1500  BP: (!) 107/54 (!) 100/57  Pulse: 69 (!) 58  Resp: 15 16  Temp: 36.7 C 36.7 C  SpO2:      Last Pain:  Vitals:   05/04/18 1625  TempSrc:   PainSc: 0-No pain   Pain Goal:                 [employer]

## 2018-05-04 NOTE — H&P (Addendum)
LABOR AND DELIVERY ADMISSION HISTORY AND PHYSICAL NOTE  Mindy Bright is a 37 y.o. female G2P1001 with IUP at [redacted]w[redacted]d presenting for SOL.   Contractions every 4 minutes. She reports positive fetal movement. She denies leakage of fluid or vaginal bleeding.  Prenatal History/Complications: PNC at [facility] Pregnancy complications: Anemia  Past Medical History: Past Medical History:  Diagnosis Date  . Abnormal Pap smear   . Anemia   . Anxiety   . Gall bladder disease   . GERD (gastroesophageal reflux disease)   . H/O hiatal hernia   . Irregular periods 04/30/2014  . Patient desires pregnancy 12/11/2013  . Pregnant 10/26/2014  . Reflux 11/09/2014  . Thyroid disease   . Unspecified symptom associated with female genital organs 12/11/2013  . Vaginal discharge 12/11/2013  . Vaginal Pap smear, abnormal    >10 years ago, no treatment    Past Surgical History: Past Surgical History:  Procedure Laterality Date  . WISDOM TOOTH EXTRACTION      Obstetrical History: OB History    Gravida  2   Para  1   Term  1   Preterm      AB      Living  1     SAB      TAB      Ectopic      Multiple  0   Live Births  1           Social History: Social History   Socioeconomic History  . Marital status: Married    Spouse name: Not on file  . Number of children: 1  . Years of education: Not on file  . Highest education level: Not on file  Occupational History  . Occupation: PE Runner, broadcasting/film/video in [place]  Social Needs  . Financial resource strain: Not on file  . Food insecurity:    Worry: Not on file    Inability: Not on file  . Transportation needs:    Medical: Not on file    Non-medical: Not on file  Tobacco Use  . Smoking status: Former Smoker    Packs/day: 0.25    Years: 3.00    Pack years: 0.75    Types: Cigarettes    Last attempt to quit: 07/14/2002    Years since quitting: 15.8  . Smokeless tobacco: Never Used  Substance and Sexual Activity  . Alcohol use: No    Comment:  not now  . Drug use: No  . Sexual activity: Yes    Birth control/protection: None  Lifestyle  . Physical activity:    Days per week: Not on file    Minutes per session: Not on file  . Stress: Not on file  Relationships  . Social connections:    Talks on phone: Not on file    Gets together: Not on file    Attends religious service: Not on file    Active member of club or organization: Not on file    Attends meetings of clubs or organizations: Not on file    Relationship status: Not on file  Other Topics Concern  . Not on file  Social History Narrative  . Not on file    Family History: Family History  Problem Relation Age of Onset  . Diabetes Mother   . Heart disease Paternal Grandfather   . Diabetes Paternal Grandfather   . Diabetes Maternal Grandmother   . Hypertension Father   . Heart disease Maternal Grandfather   . Gout Brother   .  Diabetes Maternal Aunt   . Colon cancer Neg Hx   . Stomach cancer Neg Hx   . Pancreatic cancer Neg Hx   . Esophageal cancer Neg Hx   . Liver disease Neg Hx     Allergies: Allergies  Allergen Reactions  . Penicillins Swelling    Has patient had a PCN reaction causing immediate rash, facial/tongue/throat swelling, SOB or lightheadedness with hypotension: Yes Has patient had a PCN reaction causing severe rash involving mucus membranes or skin necrosis: No Has patient had a PCN reaction that required hospitalization Yes Has patient had a PCN reaction occurring within the last 10 years: No If all of the above answers are "NO", then may proceed with Cephalosporin use.    Medications Prior to Admission  Medication Sig Dispense Refill Last Dose  . ferrous sulfate 325 (65 FE) MG tablet Take 1 tablet (325 mg total) by mouth 2 (two) times daily with a meal. (Patient not taking: Reported on 04/19/2018) 60 tablet 3 Not Taking  . Hyoscyamine Sulfate SL (LEVSIN/SL) 0.125 MG SUBL Place 1 tablet under the tongue every 12 (twelve) hours as needed.  (Patient not taking: Reported on 04/19/2018) 6 each 0 Not Taking  . iron polysaccharides (NU-IRON) 150 MG capsule Take 1 capsule (150 mg total) by mouth daily. 30 capsule 3 Taking  . levothyroxine (SYNTHROID, LEVOTHROID) 50 MCG tablet TAKE 1 TABLET(50 MCG) BY MOUTH DAILY BEFORE BREAKFAST 30 tablet 6 Taking  . NIFEdipine (PROCARDIA) 10 MG capsule Take 1 capsule (10 mg total) by mouth 3 (three) times daily. (Patient not taking: Reported on 04/26/2018) 90 capsule 0 Not Taking  . ondansetron (ZOFRAN ODT) 4 MG disintegrating tablet Take 1 tablet (4 mg total) by mouth every 8 (eight) hours as needed for nausea or vomiting. (Patient not taking: Reported on 04/05/2018) 20 tablet 0 Not Taking  . pantoprazole (PROTONIX) 40 MG tablet Take 1 tablet (40 mg total) by mouth daily. 30 tablet 11 Taking  . Prenatal Vit-Fe Fumarate-FA (PRENATAL VITAMIN PO) Take by mouth daily.   Taking  . promethazine (PHENERGAN) 25 MG tablet Take 1 tablet (25 mg total) by mouth every 6 (six) hours as needed for nausea or vomiting. (Patient not taking: Reported on 04/19/2018) 30 tablet 1 Not Taking     Review of Systems  All systems reviewed and negative except as stated in HPI  Physical Exam Blood pressure 116/70, pulse 60, temperature 98.1 F (36.7 C), temperature source Oral, resp. rate 18, height 5\' 2"  (1.575 m), weight 62.1 kg, last menstrual period 08/05/2017, SpO2 99 %, currently breastfeeding. General appearance: alert, oriented, NAD Lungs: normal respiratory effort Heart: regular rate Abdomen: soft, non-tender; gravid, FH appropriate for GA Extremities: No calf swelling or tenderness Presentation: cephalic by sutures Fetal monitoring: BHR 130, mod var, +accels, -decels Uterine actqivity: q2-37min Dilation: 6 Effacement (%): 90 Station: -1 Exam by:: Zenia Resides, RN [phone]  Prenatal labs: ABO, Rh: --/--/O POS (09/29 0540) Antibody: NEG (09/29 0540) Rubella: 1.44 (04/18 1650) RPR: Non Reactive (09/29 0540)   HBsAg: Negative (04/18 1650)  HIV: Non Reactive (08/22 0923)  GC/Chlamydia: Negative GBS:  Negative 2-hr GTT: Normal Genetic screening:  Low risk Anatomy US: Normal female  Prenatal Transfer Tool  Maternal Diabetes: No Genetic Screening: Normal Maternal Ultrasounds/Referrals: Normal Fetal Ultrasounds or other Referrals:  None Maternal Substance Abuse:  No Significant Maternal Medications:  None Significant Maternal Lab Results: None  No results found for this or any previous visit (from the past 24 hour(s)).  Patient  Active Problem List   Diagnosis Date Noted  . Preterm uterine contractions in third trimester, antepartum 03/27/2018  . Anemia during pregnancy in third trimester 03/25/2018  . Supervision of normal pregnancy 10/14/2017  . Cough 07/23/2017  . Tingling of skin 12/29/2016  . Cholelithiasis 12/29/2016  . History of abnormal cervical Pap smear 12/25/2016  . Hypothyroidism 09/06/2016  . GERD (gastroesophageal reflux disease) 11/09/2014    Assessment: ALBIRDA SHIEL is a 37 y.o. G2P1001 at [redacted]w[redacted]d here for SOL.  #Labor: Augment PRN with pitocin, consider AROM #Pain: Epidural #FWB: Cat 1 #ID:  GBS(-) #MOF: breast #MOC: Undecided #Circ:  Yes, outpatient  Terisa Starr, MD 05/04/2018, 1:54 AM   Attestation: I have seen this patient and agree with the resident's documentation. I have examined them separately, and we have discussed the plan of care.  Cristal Deer. Earlene Plater, DO OB/GYN Fellow

## 2018-05-04 NOTE — MAU Note (Signed)
Pt reports contractions every 4 mins since 0015. Pt denies LOF. Reports a small amount of brownish/red mucous. Reports good fetal movement. Cervix was 4cm on Monday

## 2018-05-04 NOTE — Telephone Encounter (Signed)
Lmom for pt to call us back.  05-04-18  AS

## 2018-05-04 NOTE — Anesthesia Preprocedure Evaluation (Signed)
Anesthesia Evaluation  Patient identified by MRN, date of birth, ID band Patient awake    Reviewed: Allergy & Precautions, H&P , NPO status , Patient's Chart, lab work & pertinent test results  History of Anesthesia Complications Negative for: history of anesthetic complications  Airway Mallampati: II  TM Distance: >3 FB Neck ROM: full    Dental no notable dental hx.    Pulmonary neg pulmonary ROS, former smoker,    Pulmonary exam normal        Cardiovascular negative cardio ROS Normal cardiovascular exam Rhythm:regular Rate:Normal     Neuro/Psych negative neurological ROS  negative psych ROS   GI/Hepatic Neg liver ROS, GERD  ,  Endo/Other  Hypothyroidism   Renal/GU negative Renal ROS  negative genitourinary   Musculoskeletal   Abdominal   Peds  Hematology negative hematology ROS (+)   Anesthesia Other Findings   Reproductive/Obstetrics (+) Pregnancy                             Anesthesia Physical Anesthesia Plan  ASA: II  Anesthesia Plan: Epidural   Post-op Pain Management:    Induction:   PONV Risk Score and Plan:   Airway Management Planned:   Additional Equipment:   Intra-op Plan:   Post-operative Plan:   Informed Consent: I have reviewed the patients History and Physical, chart, labs and discussed the procedure including the risks, benefits and alternatives for the proposed anesthesia with the patient or authorized representative who has indicated his/her understanding and acceptance.     Plan Discussed with:   Anesthesia Plan Comments:         Anesthesia Quick Evaluation

## 2018-05-04 NOTE — Anesthesia Procedure Notes (Signed)
Epidural Patient location during procedure: OB Start time: 05/04/2018 2:38 AM End time: 05/04/2018 2:54 AM  Staffing Anesthesiologist: Lucretia Kern, MD Performed: anesthesiologist   Preanesthetic Checklist Completed: patient identified, pre-op evaluation, timeout performed, IV checked, risks and benefits discussed and monitors and equipment checked  Epidural Patient position: sitting Prep: DuraPrep Patient monitoring: heart rate, continuous pulse ox and blood pressure Approach: midline Location: L3-L4 Injection technique: LOR saline  Needle:  Needle type: Tuohy  Needle gauge: 17 G Needle length: 9 cm Needle insertion depth: 4 cm Catheter type: closed end flexible Catheter size: 19 Gauge Catheter at skin depth: 9 cm  Assessment Events: blood not aspirated, injection not painful, no injection resistance, negative IV test and no paresthesia  Additional Notes Reason for block:procedure for pain

## 2018-05-05 ENCOUNTER — Telehealth: Payer: Self-pay | Admitting: *Deleted

## 2018-05-05 LAB — RPR: RPR: NONREACTIVE

## 2018-05-05 MED ORDER — WITCH HAZEL-GLYCERIN EX PADS: 1.0000 "application " | MEDICATED_PAD | CUTANEOUS | 12 refills | Status: DC | PRN

## 2018-05-05 MED ORDER — LEVOTHYROXINE SODIUM 50 MCG PO TABS: 50.0000 ug | ORAL_TABLET | Freq: Every day | ORAL | 1 refills | Status: DC

## 2018-05-05 MED ORDER — ACETAMINOPHEN 325 MG PO TABS: 650.0000 mg | ORAL_TABLET | ORAL | 0 refills | Status: DC | PRN

## 2018-05-05 MED ORDER — DIBUCAINE 1 % RE OINT: 1.0000 "application " | TOPICAL_OINTMENT | RECTAL | 0 refills | Status: DC | PRN

## 2018-05-05 MED ORDER — BENZOCAINE-MENTHOL 20-0.5 % EX AERO: 1.0000 "application " | INHALATION_SPRAY | CUTANEOUS | 0 refills | Status: DC | PRN

## 2018-05-05 MED ORDER — SENNOSIDES-DOCUSATE SODIUM 8.6-50 MG PO TABS: 2.0000 | ORAL_TABLET | ORAL | 1 refills | Status: DC

## 2018-05-05 MED ORDER — MEASLES, MUMPS & RUBELLA VAC IJ SOLR: 0.5000 mL | Freq: Once | INTRAMUSCULAR | 0 refills | Status: AC

## 2018-05-05 MED ORDER — TETANUS-DIPHTH-ACELL PERTUSSIS 5-2.5-18.5 LF-MCG/0.5 IM SUSP: 0.5000 mL | Freq: Once | INTRAMUSCULAR | 0 refills | Status: AC

## 2018-05-05 NOTE — Lactation Note (Signed)
This note was copied from a baby's chart. Lactation Consultation Note  Patient Name: Mindy Bright [id number] Date: 05/05/2018 Reason for consult: Follow-up assessment;Early term 16-38.6wks P2, 26 hours old female infant. Per mom she is feeling BF is going well. Per mom ,  It had been while since she last feed infant prior to him getting circumcised.  Mom wanted help with hand expression, LC ask mom demonstrate hand  expression and mom easily expressed 5 ml of colostrum that was spoon feed to infant.  Infant started cuing and mom latch infant to left breast using the football hold, infant mouth was wide with tongue down and audible swallowing heard. LC notice mom's milk is transitioning to white colostrum and breast are fuller with more volume. Infant was BF for 15 minutes and still BF as LC left the room. LC reviewed infant feeding  cues,  BF 8 to 12 times within 24 hours including nights. LC discussed engorgement prevention and treatment.   Maternal Data    Feeding Feeding Type: Breast Fed  LATCH Score Latch: Grasps breast easily, tongue down, lips flanged, rhythmical sucking.  Audible Swallowing: Spontaneous and intermittent  Type of Nipple: Everted at rest and after stimulation  Comfort (Breast/Nipple): Soft / non-tender  Hold (Positioning): Assistance needed to correctly position infant at breast and maintain latch.  LATCH Score: 9  Interventions Interventions: Assisted with latch;Adjust position;Support pillows  Lactation Tools Discussed/Used     Consult Status Consult Status: Complete Date: 05/05/18 Follow-up type: In-patient    Danelle Earthly 05/05/2018, 11:11 PM

## 2018-05-05 NOTE — Discharge Summary (Addendum)
Obstetrics Discharge Summary OB/GYN Faculty [facility]   Patient Name: Mindy Bright DOB: Apr 06, 1981 MRN: [id number]  Date of admission: 05/04/2018 Delivering MD: Wells Guiles R   Date of discharge: 05/05/2018  Admitting diagnosis: 18WKS CTX Intrauterine pregnancy: [redacted]w[redacted]d    Secondary diagnosis:   Active Problems:   Hypothyroidism   Anemia during pregnancy in third trimester   Normal labor and delivery     Discharge diagnosis: Term Pregnancy Delivered                                            Postpartum procedures: None  Complications: 1st degree laceration. Suture repair with 3.0 vicryl   Hospital course: Mindy MILESis a 37y.o. 317w6dho was admitted for SOL. Her pregnancy was uncomplicated aside from anemia. She received an epidural, and her labor course was notable for augmentation with AROM and delivery via SVD with delayed cord clamping and direct skin-to-skin contact. Delivery was complicated by a 1st degree laceration that was repaired via suture. Please see delivery/op note for additional details. Her postpartum course was uncomplicated. She was breastfeeding without difficulty. By day of discharge, she was passing flatus, urinating, eating and drinking without difficulty. Her pain was well-controlled, and she was discharged home with ibuprofen and acetaminophen. She will follow-up in clinic in 4-6 weeks.   Physical exam  Vitals:   05/04/18 1500 05/04/18 1830 05/04/18 2240 05/05/18 0551  BP: (!) 100/57 (!) 113/53 108/61 (!) 106/48  Pulse: (!) 58 (!) 59 (!) 57 (!) 58  Resp: '16 15 16 18  ' Temp: 98 F (36.7 C) 98.1 F (36.7 C)  98.2 F (36.8 C)  TempSrc: Oral Oral  Oral  SpO2:    100%  Weight:      Height:       Labs: Lab Results  Component Value Date   WBC 13.0 (H) 05/04/2018   HGB 11.5 (L) 05/04/2018   HCT 34.9 (L) 05/04/2018   MCV 87.5 05/04/2018   PLT 243 05/04/2018   CMP Latest Ref Rng & Units 03/31/2018  Glucose 70 - 99 mg/dL 105(H)  BUN 6 - 20  mg/dL 11  Creatinine 0.44 - 1.00 mg/dL 0.65  Sodium 135 - 145 mmol/L 133(L)  Potassium 3.5 - 5.1 mmol/L 3.4(L)  Chloride 98 - 111 mmol/L 105  CO2 22 - 32 mmol/L 17(L)  Calcium 8.9 - 10.3 mg/dL 9.0  Total Protein 6.5 - 8.1 g/dL 7.1  Total Bilirubin 0.3 - 1.2 mg/dL 0.6  Alkaline Phos 38 - 126 U/L 141(H)  AST 15 - 41 U/L 25  ALT 0 - 44 U/L 23    Discharge instructions: Per After Visit Summary and "Baby and Me Booklet"  After visit meds:  Allergies as of 05/05/2018      Reactions   Penicillins Swelling   Has patient had a PCN reaction causing immediate rash, facial/tongue/throat swelling, SOB or lightheadedness with hypotension: Yes Has patient had a PCN reaction causing severe rash involving mucus membranes or skin necrosis: No Has patient had a PCN reaction that required hospitalization Yes Has patient had a PCN reaction occurring within the last 10 years: No If all of the above answers are "NO", then may proceed with Cephalosporin use.      Medication List    STOP taking these medications   ferrous sulfate 325 (65 FE) MG tablet   Hyoscyamine  Sulfate SL 0.125 MG Subl   NIFEdipine 10 MG capsule Commonly known as:  PROCARDIA   ondansetron 4 MG disintegrating tablet Commonly known as:  ZOFRAN-ODT   pantoprazole 40 MG tablet Commonly known as:  PROTONIX   PRENATAL VITAMIN PO   promethazine 25 MG tablet Commonly known as:  PHENERGAN     TAKE these medications   acetaminophen 325 MG tablet Commonly known as:  TYLENOL Take 2 tablets (650 mg total) by mouth every 4 (four) hours as needed (for pain scale < 4).   benzocaine-Menthol 20-0.5 % Aero Commonly known as:  DERMOPLAST Apply 1 application topically as needed (perineal discomfort).   dibucaine 1 % Oint Commonly known as:  NUPERCAINAL Place 1 application rectally as needed for hemorrhoids.   iron polysaccharides 150 MG capsule Commonly known as:  NIFEREX Take 1 capsule (150 mg total) by mouth daily.    levothyroxine 50 MCG tablet Commonly known as:  SYNTHROID, LEVOTHROID TAKE 1 TABLET(50 MCG) BY MOUTH DAILY BEFORE BREAKFAST What changed:  Another medication with the same name was added. Make sure you understand how and when to take each.   levothyroxine 50 MCG tablet Commonly known as:  SYNTHROID, LEVOTHROID Take 1 tablet (50 mcg total) by mouth daily. Start taking on:  05/06/2018 What changed:  You were already taking a medication with the same name, and this prescription was added. Make sure you understand how and when to take each.   measles, mumps & rubella vaccine injection Commonly known as:  MMR Inject 0.5 mLs into the skin once for 1 dose.   senna-docusate 8.6-50 MG tablet Commonly known as:  Senokot-S Take 2 tablets by mouth daily. Start taking on:  05/06/2018   Tdap 5-2.5-18.5 LF-MCG/0.5 injection Commonly known as:  BOOSTRIX Inject 0.5 mLs into the muscle once for 1 dose.   witch hazel-glycerin pad Commonly known as:  TUCKS Apply 1 application topically as needed for hemorrhoids.       Postpartum contraception: Vasectomy Diet: Routine Diet Activity: Advance as tolerated. Pelvic rest for 6 weeks.    Outpatient follow up:4-6 weeks Follow-up Appt: Future Appointments  Date Time Provider [place]  06/09/2018  3:00 PM Roma Schanz, CNM [facility]   Follow-up Visit:No follow-ups on file.  Newborn Data: Live born female  Birth Weight: 7 lb 3.9 oz (3286 g) APGAR: 9, 9  Newborn Delivery   Birth date/time:  05/04/2018 08:07:00 Delivery type:  Vaginal, Spontaneous     Baby Feeding: Breast Disposition:home with mother  Mindy Plum, MD Family Medicine Resident, Faculty Service   I confirm that I have verified the information documented in the resident's note and that I have also personally reperformed the physical exam and all medical decision making activities.   Maye Hides CNM

## 2018-05-05 NOTE — Progress Notes (Signed)
MOB was referred for history of depression/anxiety. * Referral screened out by Clinical Social Worker because none of the following criteria appear to apply: ~ History of anxiety/depression during this pregnancy, or of post-partum depression following prior delivery. ~ Diagnosis of anxiety and/or depression within last 3 years; Per CSW's note on 06/17/15 MOB was dx with anxiety while attending college.  OR * MOB's symptoms currently being treated with medication and/or therapy.  Please contact the Clinical Social Worker if needs arise, by [facility] request, or if MOB scores greater than 9/yes to question 10 on [place] Postpartum Depression Screen.  Blaine Hamper, MSW, LCSW Clinical Social Work [phone]

## 2018-05-05 NOTE — Discharge Instructions (Signed)

## 2018-05-05 NOTE — Lactation Note (Signed)
This note was copied from a baby's chart. Lactation Consultation Note Baby 19 hrs old. Mom states baby is BF well. Mom had difficulty BF her 37 yr old for the first month. Latching was difficult d/t she wouldn't open wide enough or suck properly. Mom states not having any issues. Discussed newborn behavior, STS, I&O, differences in BF a toddler verses newborn. Answered questions mom and FOB asked. Mom has good everted nipples. Latched baby in cross cradle position. Discussed positions, support and comfort. Discussed pumping and milk storage. Encouraged to call for questions or assistance. WH/LC brochure given w/resources, support groups and LC services. . Patient Name: Boy Shenique Childers [id number] Date: 05/05/2018 Reason for consult: Initial assessment;Maternal endocrine disorder Type of Endocrine Disorder?: Thyroid   Maternal Data Has patient been taught Hand Expression?: Yes Does the patient have breastfeeding experience prior to this delivery?: Yes  Feeding Feeding Type: Breast Fed  LATCH Score Latch: Repeated attempts needed to sustain latch, nipple held in mouth throughout feeding, stimulation needed to elicit sucking reflex.  Audible Swallowing: A few with stimulation  Type of Nipple: Everted at rest and after stimulation  Comfort (Breast/Nipple): Soft / non-tender  Hold (Positioning): Assistance needed to correctly position infant at breast and maintain latch.  LATCH Score: 7  Interventions Interventions: Breast feeding basics reviewed;Support pillows;Assisted with latch;Position options;Skin to skin;Breast massage;Hand express;Breast compression  Lactation Tools Discussed/Used WIC Program: No   Consult Status Consult Status: Follow-up Date: 07/06/17 Follow-up type: In-patient    Coti Burd, Diamond Nickel 05/05/2018, 4:04 AM

## 2018-05-05 NOTE — Telephone Encounter (Signed)
Lmom for pt to call us back to schedule pp appointment.  05-05-18  AS

## 2018-05-09 ENCOUNTER — Encounter: Payer: BLUE CROSS/BLUE SHIELD | Admitting: Obstetrics & Gynecology

## 2018-06-09 ENCOUNTER — Encounter: Payer: Self-pay | Admitting: Women's Health

## 2018-06-09 ENCOUNTER — Ambulatory Visit (INDEPENDENT_AMBULATORY_CARE_PROVIDER_SITE_OTHER): Payer: BLUE CROSS/BLUE SHIELD | Admitting: Women's Health

## 2018-06-09 NOTE — Progress Notes (Signed)
   POSTPARTUM VISIT Patient name: Mindy Bright MRN [id number]  Date of birth: Oct 12, 1980 Chief Complaint:   Postpartum Care  History of Present Illness:   Mindy Bright is a 37 y.o. 652P2002 Caucasian female being seen today for a postpartum visit. She is 5 weeks postpartum following a spontaneous vaginal delivery at 38.6 gestational weeks. Anesthesia: epidural. Laceration: 1st degree. I have fully reviewed the prenatal and intrapartum course. Pregnancy uncomplicated. Postpartum course has been uncomplicated. Bleeding no bleeding. Bowel function is normal. Bladder function is normal.  Patient is not sexually active. Last sexual activity: prior to birth of baby.  Contraception method is condoms>vasectomy.  [place] Postpartum Depression Screening: negative. Score 0.   Last pap 12/25/16.  Results were normal .  Patient's last menstrual period was 08/05/2017 (exact date).  Baby's course has been uncomplicated. Baby is feeding by breast.  Review of Systems:   Pertinent items are noted in HPI Denies Abnormal vaginal discharge w/ itching/odor/irritation, headaches, visual changes, shortness of breath, chest pain, abdominal pain, severe nausea/vomiting, or problems with urination or bowel movements. Pertinent History Reviewed:  Reviewed past medical,surgical, obstetrical and family history.  Reviewed problem list, medications and allergies. OB History  Gravida Para Term Preterm AB Living  2 2 2     2   SAB TAB Ectopic Multiple Live Births        0 2    # Outcome Date GA Lbr Len/2nd Weight Sex Delivery Anes PTL Lv  2 Term 05/04/18 3325w6d 07:00 / 00:37 7 lb 3.9 oz (3.286 kg) M Vag-Spont EPI  LIV     Birth Comments: wnl  1 Term 06/15/15 3533w1d 05:09 / 05:08 5 lb 13.3 oz (2.645 kg) F Vag-Spont EPI N LIV   Physical Assessment:   Vitals:   06/09/18 1507  BP: 101/66  Pulse: 61  Weight: 118 lb 3.2 oz (53.6 kg)  Height: 5\' 2"  (1.575 m)  Body mass index is 21.62 kg/m.       Physical  Examination:   General appearance: alert, well appearing, and in no distress  Mental status: alert, oriented to person, place, and time  Skin: warm & dry   Cardiovascular: normal heart rate noted   Respiratory: normal respiratory effort, no distress   Breasts: deferred, no complaints   Abdomen: soft, non-tender   Pelvic: VULVA: normal appearing vulva with no masses, tenderness or lesions, UTERUS: uterus is normal size, shape, consistency and nontender  Rectal: no hemorrhoids  Extremities: no edema       No results found for this or any previous visit (from the past 24 hour(s)).  Assessment & Plan:  1) Postpartum exam 2) 5 wks s/p SVB 3) Breastfeeding 4) Depression screening 5) Contraception counseling, pt prefers condoms and husband getting vasectomy  Meds: No orders of the defined types were placed in this encounter.   Follow-up: Return for July for , Physical.   No orders of the defined types were placed in this encounter.   Cheral MarkerKimberly R Nancyjo Givhan CNM, [facility]NP[facility] 06/09/2018 4:01 PM

## 2018-06-16 ENCOUNTER — Telehealth: Payer: Self-pay | Admitting: Women's Health

## 2018-06-16 NOTE — Telephone Encounter (Signed)
Patient called, stated that her FMLA papers have her going back to work on 07/07/18.  She would like to go back on 07/04/18, but her work needs a note stating that she can.  [phone]

## 2018-06-27 ENCOUNTER — Encounter: Payer: Self-pay | Admitting: *Deleted

## 2018-06-27 ENCOUNTER — Telehealth: Payer: Self-pay | Admitting: Women's Health

## 2018-06-27 NOTE — Telephone Encounter (Signed)
Pt wants to go back to work 1-6 and F<MLA has her going back on the 9th needs a note for work just let pt know and she can come and pick up the note

## 2018-09-05 ENCOUNTER — Encounter: Payer: BLUE CROSS/BLUE SHIELD | Admitting: Internal Medicine

## 2018-09-09 ENCOUNTER — Encounter: Payer: BLUE CROSS/BLUE SHIELD | Admitting: Internal Medicine

## 2018-09-16 ENCOUNTER — Encounter: Payer: BLUE CROSS/BLUE SHIELD | Admitting: Internal Medicine

## 2018-09-19 ENCOUNTER — Telehealth: Payer: Self-pay | Admitting: Women's Health

## 2018-09-19 ENCOUNTER — Telehealth: Payer: Self-pay | Admitting: *Deleted

## 2018-09-19 DIAGNOSIS — L659 Nonscarring hair loss, unspecified: Secondary | ICD-10-CM

## 2018-09-19 DIAGNOSIS — E039 Hypothyroidism, unspecified: Secondary | ICD-10-CM

## 2018-09-19 DIAGNOSIS — Z862 Personal history of diseases of the blood and blood-forming organs and certain disorders involving the immune mechanism: Secondary | ICD-10-CM

## 2018-09-19 NOTE — Telephone Encounter (Signed)
Please call pt she would like to speak to a nurse

## 2018-09-19 NOTE — Telephone Encounter (Signed)
Patient states she seems to be loosing more hair than usual.  States she has also been anemic as well.  Says her thyroid level has not been checked since she was pregnant last summer.  Please advise.

## 2018-09-20 ENCOUNTER — Telehealth: Payer: Self-pay | Admitting: *Deleted

## 2018-09-20 NOTE — Telephone Encounter (Signed)
LMOVM that labs have been ordered and she can come at anytime to have drawn.

## 2018-09-21 DIAGNOSIS — Z862 Personal history of diseases of the blood and blood-forming organs and certain disorders involving the immune mechanism: Secondary | ICD-10-CM | POA: Diagnosis not present

## 2018-09-21 DIAGNOSIS — L659 Nonscarring hair loss, unspecified: Secondary | ICD-10-CM | POA: Diagnosis not present

## 2018-09-21 DIAGNOSIS — E039 Hypothyroidism, unspecified: Secondary | ICD-10-CM | POA: Diagnosis not present

## 2018-09-22 ENCOUNTER — Encounter: Payer: BLUE CROSS/BLUE SHIELD | Admitting: Internal Medicine

## 2018-09-22 LAB — CBC
Hematocrit: 40.9 % (ref 34.0–46.6)
Hemoglobin: 14.2 g/dL (ref 11.1–15.9)
MCH: 30.1 pg (ref 26.6–33.0)
MCHC: 34.7 g/dL (ref 31.5–35.7)
MCV: 87 fL (ref 79–97)
PLATELETS: 355 10*3/uL (ref 150–450)
RBC: 4.71 x10E6/uL (ref 3.77–5.28)
RDW: 11.8 % (ref 11.7–15.4)
WBC: 9.6 10*3/uL (ref 3.4–10.8)

## 2018-09-22 LAB — COMPREHENSIVE METABOLIC PANEL
A/G RATIO: 1.7 (ref 1.2–2.2)
ALK PHOS: 139 IU/L — AB (ref 39–117)
ALT: 11 IU/L (ref 0–32)
AST: 13 IU/L (ref 0–40)
Albumin: 4.5 g/dL (ref 3.8–4.8)
BILIRUBIN TOTAL: 0.9 mg/dL (ref 0.0–1.2)
BUN/Creatinine Ratio: 16 (ref 9–23)
BUN: 15 mg/dL (ref 6–20)
CALCIUM: 10.1 mg/dL (ref 8.7–10.2)
CHLORIDE: 102 mmol/L (ref 96–106)
CO2: 22 mmol/L (ref 20–29)
Creatinine, Ser: 0.96 mg/dL (ref 0.57–1.00)
GFR calc Af Amer: 87 mL/min/{1.73_m2} (ref >59)
GFR, EST NON AFRICAN AMERICAN: 76 mL/min/{1.73_m2} (ref >59)
GLOBULIN, TOTAL: 2.7 g/dL (ref 1.5–4.5)
Glucose: 124 mg/dL — ABNORMAL HIGH (ref 65–99)
POTASSIUM: 3.6 mmol/L (ref 3.5–5.2)
SODIUM: 141 mmol/L (ref 134–144)
Total Protein: 7.2 g/dL (ref 6.0–8.5)

## 2018-09-22 LAB — TSH: TSH: 1.11 u[IU]/mL (ref 0.450–4.500)

## 2019-01-04 DIAGNOSIS — R739 Hyperglycemia, unspecified: Secondary | ICD-10-CM | POA: Insufficient documentation

## 2019-01-04 NOTE — Progress Notes (Signed)
Subjective:    Patient ID: Mindy Bright, female    DOB: June 07, 1981, 38 y.o.   MRN: [id number]  HPI She is here for a physical exam.   Right ear and neck hurting a little and feels like it is draining - it is thick. No ear does not pop.  There is pressure at times.  She does a sinus rinse nightly.  She was going to try claritin.    Her gerd is controlled as long as she takes her medication daily.     Medications and allergies reviewed with patient and updated if appropriate.  Patient Active Problem List   Diagnosis Date Noted  . Hyperglycemia 01/04/2019  . Cough 07/23/2017  . Tingling of skin 12/29/2016  . Cholelithiasis 12/29/2016  . History of abnormal cervical Pap smear 12/25/2016  . Hypothyroidism 09/06/2016  . GERD (gastroesophageal reflux disease) 11/09/2014    Current Outpatient Medications on File Prior to Visit  Medication Sig Dispense Refill  . levothyroxine (SYNTHROID, LEVOTHROID) 50 MCG tablet TAKE 1 TABLET(50 MCG) BY MOUTH DAILY BEFORE BREAKFAST 30 tablet 6  . pantoprazole (PROTONIX) 40 MG tablet Take 40 mg by mouth daily.    . Prenatal Vit-Fe Fumarate-FA (PRENATAL MULTIVITAMIN) TABS tablet Take 1 tablet by mouth daily at 12 noon.     No current facility-administered medications on file prior to visit.     Past Medical History:  Diagnosis Date  . Abnormal Pap smear   . Anemia   . Anxiety   . Gall bladder disease   . GERD (gastroesophageal reflux disease)   . H/O hiatal hernia   . Irregular periods 04/30/2014  . Patient desires pregnancy 12/11/2013  . Pregnant 10/26/2014  . Reflux 11/09/2014  . Thyroid disease   . Unspecified symptom associated with female genital organs 12/11/2013  . Vaginal discharge 12/11/2013  . Vaginal Pap smear, abnormal    >10 years ago, no treatment    Past Surgical History:  Procedure Laterality Date  . WISDOM TOOTH EXTRACTION      Social History   Socioeconomic History  . Marital status: Married    Spouse name: Not  on file  . Number of children: 2  . Years of education: Not on file  . Highest education level: Not on file  Occupational History  . Occupation: PE Pharmacist, hospital in [place]  . Financial resource strain: Not on file  . Food insecurity    Worry: Not on file    Inability: Not on file  . Transportation needs    Medical: Not on file    Non-medical: Not on file  Tobacco Use  . Smoking status: Former Smoker    Packs/day: 0.25    Years: 3.00    Pack years: 0.75    Types: Cigarettes    Quit date: 07/14/2002    Years since quitting: 16.4  . Smokeless tobacco: Never Used  Substance and Sexual Activity  . Alcohol use: No    Comment: not now  . Drug use: No  . Sexual activity: Not Currently    Birth control/protection: None  Lifestyle  . Physical activity    Days per week: Not on file    Minutes per session: Not on file  . Stress: Not on file  Relationships  . Social Herbalist on phone: Not on file    Gets together: Not on file    Attends religious service: Not on file    Active member of  club or organization: Not on file    Attends meetings of clubs or organizations: Not on file    Relationship status: Not on file  Other Topics Concern  . Not on file  Social History Narrative  . Not on file    Family History  Problem Relation Age of Onset  . Diabetes Mother   . Heart disease Paternal Grandfather   . Diabetes Paternal Grandfather   . Diabetes Maternal Grandmother   . Hypertension Father   . Heart disease Maternal Grandfather   . Gout Brother   . Diabetes Maternal Aunt   . Colon cancer Neg Hx   . Stomach cancer Neg Hx   . Pancreatic cancer Neg Hx   . Esophageal cancer Neg Hx   . Liver disease Neg Hx     Review of Systems  Constitutional: Negative for chills and fever.  Eyes: Negative for visual disturbance.  Respiratory: Negative for cough, shortness of breath and wheezing.   Cardiovascular: Negative for chest pain, palpitations and leg swelling.   Gastrointestinal: Negative for abdominal pain, blood in stool, constipation, diarrhea and nausea.  Genitourinary: Negative for dysuria and hematuria.  Musculoskeletal: Negative for arthralgias and back pain.  Skin: Negative for color change and rash.  Neurological: Negative for light-headedness and headaches.  Psychiatric/Behavioral: Negative for dysphoric mood. The patient is not nervous/anxious.        Objective:   Vitals:   01/05/19 0828  BP: 128/84  Pulse: 72  Resp: 16  Temp: 97.8 F (36.6 C)  SpO2: 99%   Filed Weights   01/05/19 0828  Weight: 115 lb 12.8 oz (52.5 kg)   Body mass index is 21.18 kg/m.  BP Readings from Last 3 Encounters:  01/05/19 128/84  06/09/18 101/66  05/05/18 (!) 102/56    Wt Readings from Last 3 Encounters:  01/05/19 115 lb 12.8 oz (52.5 kg)  06/09/18 118 lb 3.2 oz (53.6 kg)  05/04/18 137 lb (62.1 kg)     Physical Exam Constitutional: She appears well-developed and well-nourished. No distress.  HENT:  Head: Normocephalic and atraumatic.  Right Ear: External ear normal. Normal ear canal and TM Left Ear: External ear normal.  Normal ear canal and TM Mouth/Throat: Oropharynx is clear and moist.  Eyes: Conjunctivae and EOM are normal.  Neck: Neck supple. No tracheal deviation present. No thyromegaly present.  No carotid bruit  Cardiovascular: Normal rate, regular rhythm and normal heart sounds.   No murmur heard.  No edema. Pulmonary/Chest: Effort normal and breath sounds normal. No respiratory distress. She has no wheezes. She has no rales.  Breast: deferred   Abdominal: Soft. She exhibits no distension. There is no tenderness.  Lymphadenopathy: She has no cervical adenopathy.  Skin: Skin is warm and dry. She is not diaphoretic.  Psychiatric: She has a normal mood and affect. Her behavior is normal.        Assessment & Plan:   Physical exam: Screening blood work ordered Immunizations  Up to date  Gyn  Up to date  Exercise   No  regular exercise, active with her kids Weight  Normal BMI Skin    No concerns Substance abuse   none  See Problem List for Assessment and Plan of chronic medical problems.   FU in one year

## 2019-01-04 NOTE — Patient Instructions (Addendum)
  Tests ordered today. Your results will be released to [place] (or called to you) after review.  If any changes need to be made, you will be notified at that same time.  All other Health Maintenance issues reviewed.   All recommended immunizations and age-appropriate screenings are up-to-date or discussed.  No immunization administered today.   Medications reviewed and updated.  Changes include :   Pepcid, pantoprazole 20 mg as discussed  Your prescription(s) have been submitted to your pharmacy. Please take as directed and contact our office if you believe you are having problem(s) with the medication(s).    Please followup in 12 months

## 2019-01-05 ENCOUNTER — Encounter: Payer: Self-pay | Admitting: Internal Medicine

## 2019-01-05 ENCOUNTER — Other Ambulatory Visit (INDEPENDENT_AMBULATORY_CARE_PROVIDER_SITE_OTHER): Payer: BC Managed Care – PPO

## 2019-01-05 ENCOUNTER — Ambulatory Visit (INDEPENDENT_AMBULATORY_CARE_PROVIDER_SITE_OTHER): Payer: BC Managed Care – PPO | Admitting: Internal Medicine

## 2019-01-05 ENCOUNTER — Other Ambulatory Visit: Payer: Self-pay

## 2019-01-05 VITALS — BP 128/84 | HR 72 | Temp 97.8°F | Resp 16 | Ht 62.0 in | Wt 115.8 lb

## 2019-01-05 DIAGNOSIS — R739 Hyperglycemia, unspecified: Secondary | ICD-10-CM

## 2019-01-05 DIAGNOSIS — E039 Hypothyroidism, unspecified: Secondary | ICD-10-CM

## 2019-01-05 DIAGNOSIS — Z833 Family history of diabetes mellitus: Secondary | ICD-10-CM | POA: Insufficient documentation

## 2019-01-05 DIAGNOSIS — K219 Gastro-esophageal reflux disease without esophagitis: Secondary | ICD-10-CM | POA: Diagnosis not present

## 2019-01-05 DIAGNOSIS — Z0000 Encounter for general adult medical examination without abnormal findings: Secondary | ICD-10-CM | POA: Diagnosis not present

## 2019-01-05 LAB — CBC WITH DIFFERENTIAL/PLATELET
Basophils Absolute: 0.1 10*3/uL (ref 0.0–0.1)
Basophils Relative: 1.3 % (ref 0.0–3.0)
Eosinophils Absolute: 0.3 10*3/uL (ref 0.0–0.7)
Eosinophils Relative: 3.2 % (ref 0.0–5.0)
HCT: 39.9 % (ref 36.0–46.0)
Hemoglobin: 13.3 g/dL (ref 12.0–15.0)
Lymphocytes Relative: 24 % (ref 12.0–46.0)
Lymphs Abs: 2.6 10*3/uL (ref 0.7–4.0)
MCHC: 33.4 g/dL (ref 30.0–36.0)
MCV: 87.6 fl (ref 78.0–100.0)
Monocytes Absolute: 0.6 10*3/uL (ref 0.1–1.0)
Monocytes Relative: 5.3 % (ref 3.0–12.0)
Neutro Abs: 7.2 10*3/uL (ref 1.4–7.7)
Neutrophils Relative %: 66.2 % (ref 43.0–77.0)
Platelets: 405 10*3/uL — ABNORMAL HIGH (ref 150.0–400.0)
RBC: 4.55 Mil/uL (ref 3.87–5.11)
RDW: 13.3 % (ref 11.5–15.5)
WBC: 10.9 10*3/uL — ABNORMAL HIGH (ref 4.0–10.5)

## 2019-01-05 LAB — LIPID PANEL
Cholesterol: 181 mg/dL (ref 0–200)
HDL: 56.3 mg/dL (ref >39.00)
LDL Cholesterol: 103 mg/dL — ABNORMAL HIGH (ref 0–99)
NonHDL: 124.39
Total CHOL/HDL Ratio: 3
Triglycerides: 108 mg/dL (ref 0.0–149.0)
VLDL: 21.6 mg/dL (ref 0.0–40.0)

## 2019-01-05 LAB — COMPREHENSIVE METABOLIC PANEL
ALT: 10 U/L (ref 0–35)
AST: 12 U/L (ref 0–37)
Albumin: 4.3 g/dL (ref 3.5–5.2)
Alkaline Phosphatase: 118 U/L — ABNORMAL HIGH (ref 39–117)
BUN: 11 mg/dL (ref 6–23)
CO2: 27 mEq/L (ref 19–32)
Calcium: 9.3 mg/dL (ref 8.4–10.5)
Chloride: 105 mEq/L (ref 96–112)
Creatinine, Ser: 0.84 mg/dL (ref 0.40–1.20)
GFR: 75.85 mL/min (ref >60.00)
Glucose, Bld: 91 mg/dL (ref 70–99)
Potassium: 4 mEq/L (ref 3.5–5.1)
Sodium: 140 mEq/L (ref 135–145)
Total Bilirubin: 0.6 mg/dL (ref 0.2–1.2)
Total Protein: 7.5 g/dL (ref 6.0–8.3)

## 2019-01-05 LAB — TSH: TSH: 3.79 u[IU]/mL (ref 0.35–4.50)

## 2019-01-05 LAB — HEMOGLOBIN A1C: Hgb A1c MFr Bld: 5.6 % (ref 4.6–6.5)

## 2019-01-05 MED ORDER — LEVOTHYROXINE SODIUM 50 MCG PO TABS: ORAL_TABLET | ORAL | 3 refills | Status: DC

## 2019-01-05 MED ORDER — FAMOTIDINE 40 MG PO TABS: 40.0000 mg | ORAL_TABLET | Freq: Two times a day (BID) | ORAL | 3 refills | Status: DC

## 2019-01-05 MED ORDER — PANTOPRAZOLE SODIUM 20 MG PO TBEC: 20.0000 mg | DELAYED_RELEASE_TABLET | Freq: Every day | ORAL | 1 refills | Status: DC

## 2019-01-05 NOTE — Assessment & Plan Note (Signed)
Taking pantoprazole 40 mg daily - has tried to stop cold [place] and it did not work Given concern with being on it long term - will try to start pepcid 40 mg BID and slowly taper off pantoprazole  To 40 mg alternating with 20 then eventually 20 mg daily with pepcid and then hopefully just on pepcid Goal would be eventually to be on nothing She is compliant with a GERD diet Call with questions

## 2019-01-05 NOTE — Assessment & Plan Note (Signed)
Clinically euthyroid Check tsh  Titrate med dose if needed  

## 2019-01-05 NOTE — Assessment & Plan Note (Signed)
a1c given family history

## 2019-01-05 NOTE — Assessment & Plan Note (Signed)
a1c

## 2019-01-06 DIAGNOSIS — L709 Acne, unspecified: Secondary | ICD-10-CM | POA: Diagnosis not present

## 2019-01-06 DIAGNOSIS — L811 Chloasma: Secondary | ICD-10-CM | POA: Diagnosis not present

## 2019-01-06 DIAGNOSIS — D229 Melanocytic nevi, unspecified: Secondary | ICD-10-CM | POA: Diagnosis not present

## 2019-03-29 ENCOUNTER — Other Ambulatory Visit (HOSPITAL_COMMUNITY)
Admission: RE | Admit: 2019-03-29 | Discharge: 2019-03-29 | Disposition: A | Discharge status: Home / Self Care | Payer: BC Managed Care – PPO | Source: Ambulatory Visit | Attending: Obstetrics & Gynecology | Admitting: Obstetrics & Gynecology

## 2019-03-29 ENCOUNTER — Encounter: Payer: Self-pay | Admitting: Adult Health

## 2019-03-29 ENCOUNTER — Ambulatory Visit (INDEPENDENT_AMBULATORY_CARE_PROVIDER_SITE_OTHER): Payer: BC Managed Care – PPO | Admitting: Adult Health

## 2019-03-29 ENCOUNTER — Other Ambulatory Visit: Payer: Self-pay

## 2019-03-29 VITALS — BP 107/72 | HR 71 | Ht 62.0 in | Wt 113.4 lb

## 2019-03-29 DIAGNOSIS — Z124 Encounter for screening for malignant neoplasm of cervix: Secondary | ICD-10-CM | POA: Diagnosis not present

## 2019-03-29 DIAGNOSIS — Z01419 Encounter for gynecological examination (general) (routine) without abnormal findings: Secondary | ICD-10-CM | POA: Diagnosis not present

## 2019-03-29 NOTE — Addendum Note (Signed)
Addended by: Diona Fanti A on: 03/29/2019 04:42 PM   Modules accepted: Orders

## 2019-03-29 NOTE — Progress Notes (Signed)
Patient ID: Mindy Bright, female   DOB: 30-Mar-1981, 38 y.o.   MRN: [id number] History of Present Illness: Mindy Bright is a 38 year old white female, married, G2P2, in for a pap smear, she had her physical with PCP this summer. She is PE Pharmacist, hospital in [place]. She is still breast feeding and husband had vasectomy last month.  PCP is Dr Quay Burow.   Current Medications, Allergies, Past Medical History, Past Surgical History, Family History and Social History were reviewed in [employer] record.     Review of Systems: Patient denies any headaches, hearing loss, fatigue, blurred vision, shortness of breath, chest pain, abdominal pain, problems with bowel movements, urination, or intercourse. No joint pain or mood swings.    Physical Exam:BP 107/72 (BP Location: Right Arm, Patient Position: Sitting, Cuff Size: Normal)   Pulse 71   Ht 5\' 2"  (1.575 m)   Wt 113 lb 6.4 oz (51.4 kg)   BMI 20.74 kg/m  General:  Well developed, well nourished, no acute distress Skin:  Warm and dry Pelvic:  External genitalia is normal in appearance, no lesions.  The vagina is normal in appearance. Urethra has no lesions or masses. The cervix is bulbous and smooth, pap with high risk HPV and genotyping 16/18 performed.  Uterus is felt to be normal size, shape, and contour.  No adnexal masses or tenderness noted.Bladder is non tender, no masses felt. Extremities/musculoskeletal:  No swelling or varicosities noted, no clubbing or cyanosis Psych:  No mood changes, alert and cooperative,seems happy Fall risk is low. PHQ 9 score is 0. Examination chaperoned by Diona Fanti CMA.  Impression and Plan: 1. Encounter for cervical Pap smear with pelvic exam -pap sent -pap in 3 years -physical and labs with PCP

## 2019-04-03 ENCOUNTER — Other Ambulatory Visit: Payer: BC Managed Care – PPO | Admitting: Obstetrics & Gynecology

## 2019-04-03 LAB — CYTOLOGY - PAP
Adequacy: ABSENT
Diagnosis: NEGATIVE
High risk HPV: NEGATIVE

## 2019-04-07 ENCOUNTER — Ambulatory Visit: Payer: BC Managed Care – PPO | Admitting: Adult Health

## 2019-05-09 ENCOUNTER — Encounter: Payer: Self-pay | Admitting: Internal Medicine

## 2019-06-28 ENCOUNTER — Encounter: Payer: Self-pay | Admitting: Internal Medicine

## 2019-07-27 ENCOUNTER — Encounter: Payer: Self-pay | Admitting: Physician Assistant

## 2019-07-27 ENCOUNTER — Other Ambulatory Visit (INDEPENDENT_AMBULATORY_CARE_PROVIDER_SITE_OTHER): Payer: BC Managed Care – PPO

## 2019-07-27 ENCOUNTER — Ambulatory Visit: Payer: BC Managed Care – PPO | Admitting: Physician Assistant

## 2019-07-27 VITALS — BP 98/60 | HR 56 | Temp 98.0°F | Ht 62.0 in | Wt 118.5 lb

## 2019-07-27 DIAGNOSIS — R1013 Epigastric pain: Secondary | ICD-10-CM

## 2019-07-27 DIAGNOSIS — R1011 Right upper quadrant pain: Secondary | ICD-10-CM

## 2019-07-27 LAB — CBC WITH DIFFERENTIAL/PLATELET
Basophils Absolute: 0.1 10*3/uL (ref 0.0–0.1)
Basophils Relative: 0.8 % (ref 0.0–3.0)
Eosinophils Absolute: 0.4 10*3/uL (ref 0.0–0.7)
Eosinophils Relative: 3.5 % (ref 0.0–5.0)
HCT: 39 % (ref 36.0–46.0)
Hemoglobin: 13.1 g/dL (ref 12.0–15.0)
Lymphocytes Relative: 23.4 % (ref 12.0–46.0)
Lymphs Abs: 2.7 10*3/uL (ref 0.7–4.0)
MCHC: 33.7 g/dL (ref 30.0–36.0)
MCV: 84.9 fl (ref 78.0–100.0)
Monocytes Absolute: 0.6 10*3/uL (ref 0.1–1.0)
Monocytes Relative: 5 % (ref 3.0–12.0)
Neutro Abs: 7.6 10*3/uL (ref 1.4–7.7)
Neutrophils Relative %: 67.3 % (ref 43.0–77.0)
Platelets: 356 10*3/uL (ref 150.0–400.0)
RBC: 4.6 Mil/uL (ref 3.87–5.11)
RDW: 12.9 % (ref 11.5–15.5)
WBC: 11.4 10*3/uL — ABNORMAL HIGH (ref 4.0–10.5)

## 2019-07-27 LAB — COMPREHENSIVE METABOLIC PANEL
ALT: 10 U/L (ref 0–35)
AST: 14 U/L (ref 0–37)
Albumin: 4.4 g/dL (ref 3.5–5.2)
Alkaline Phosphatase: 88 U/L (ref 39–117)
BUN: 16 mg/dL (ref 6–23)
CO2: 29 mEq/L (ref 19–32)
Calcium: 9.3 mg/dL (ref 8.4–10.5)
Chloride: 101 mEq/L (ref 96–112)
Creatinine, Ser: 0.72 mg/dL (ref 0.40–1.20)
GFR: 90.35 mL/min (ref >60.00)
Glucose, Bld: 94 mg/dL (ref 70–99)
Potassium: 4 mEq/L (ref 3.5–5.1)
Sodium: 135 mEq/L (ref 135–145)
Total Bilirubin: 0.7 mg/dL (ref 0.2–1.2)
Total Protein: 7.5 g/dL (ref 6.0–8.3)

## 2019-07-27 LAB — SEDIMENTATION RATE: Sed Rate: 18 mm/hr (ref 0–20)

## 2019-07-27 MED ORDER — PANTOPRAZOLE SODIUM 20 MG PO TBEC: 20.0000 mg | DELAYED_RELEASE_TABLET | Freq: Every day | ORAL | 1 refills | Status: DC

## 2019-07-27 MED ORDER — FAMOTIDINE 40 MG PO TABS: 40.0000 mg | ORAL_TABLET | Freq: Two times a day (BID) | ORAL | 3 refills | Status: DC

## 2019-07-27 MED ORDER — HYOSCYAMINE SULFATE 0.125 MG SL SUBL: 0.1250 mg | SUBLINGUAL_TABLET | Freq: Four times a day (QID) | SUBLINGUAL | 3 refills | Status: DC | PRN

## 2019-07-27 NOTE — Patient Instructions (Signed)
If you are age 39 or older, your body mass index should be between 23-30. Your Body mass index is 21.67 kg/m. If this is out of the aforementioned range listed, please consider follow up with your Primary Care Provider.  If you are age 79 or younger, your body mass index should be between 19-25. Your Body mass index is 21.67 kg/m. If this is out of the aformentioned range listed, please consider follow up with your Primary Care Provider.   Your provider has requested that you go to the basement level for lab work before leaving today. Press "B" on the elevator. The lab is located at the first door on the left as you exit the elevator.  We have sent the following medications to your pharmacy for you to pick up at your convenience: Levsin 0.125 SL dissolve one tablet on tongue every 6 hours as needed for abdominal pain.   Continue Protonix 20 mg daily. Continue Pepcid 40 mg twice daily.   You have been scheduled for a CT scan of the abdomen and pelvis at [facility] ([address]---this is in the same building as [employer]).   You are scheduled on Friday 08/04/19 at 3 pm. You should arrive 15 minutes prior to your appointment time for registration. Please follow the written instructions below on the day of your exam:  WARNING: IF YOU ARE ALLERGIC TO IODINE/X-RAY DYE, PLEASE NOTIFY RADIOLOGY IMMEDIATELY AT [phone]! YOU WILL BE GIVEN A 13 HOUR PREMEDICATION PREP.  1) Do not eat or drink anything after 11 am (4 hours prior to your test) 2) You have been given 2 bottles of oral contrast to drink. The solution may taste better if refrigerated, but do NOT add ice or any other liquid to this solution. Shake well before drinking.    Drink 1 bottle of contrast @ 1 pm (2 hours prior to your exam)  Drink 1 bottle of contrast @ 2 pm (1 hour prior to your exam)  You may take any medications as prescribed with a small amount of water, if necessary. If you take any of the  following medications: METFORMIN, GLUCOPHAGE, GLUCOVANCE, AVANDAMET, RIOMET, FORTAMET, Hominy MET, JANUMET, GLUMETZA or METAGLIP, you MAY be asked to HOLD this medication 48 hours AFTER the exam.  The purpose of you drinking the oral contrast is to aid in the visualization of your intestinal tract. The contrast solution may cause some diarrhea. Depending on your individual set of symptoms, you may also receive an intravenous injection of x-ray contrast/dye. Plan on being at [facility] for 30 minutes or longer, depending on the type of exam you are having performed.  This test typically takes 30-45 minutes to complete.  If you have any questions regarding your exam or if you need to reschedule, you may call the CT department at [phone] between the hours of 8:00 am and 5:00 pm, Monday-Friday.  ______________________________________________________________

## 2019-07-28 ENCOUNTER — Telehealth: Payer: Self-pay | Admitting: *Deleted

## 2019-07-28 NOTE — Telephone Encounter (Signed)
Patient insurance want cover Hyoscyamine SL tablets. They prefer Hyoscyamine Tablets. Can we switch? Please advise.

## 2019-07-30 ENCOUNTER — Encounter: Payer: Self-pay | Admitting: Physician Assistant

## 2019-07-30 NOTE — Progress Notes (Signed)
Subjective:    Patient ID: Mindy Bright, female    DOB: 04-Jun-1981, 39 y.o.   MRN: [id number]  HPI Mindy Bright is a pleasant 39 year old white female, known to Dr. Henrene Pastor, last seen in the office in 2018 who comes in today with complaints of recurrent right upper quadrant pain. Patient has previous history of similar pain over the past several years, without any objective evidence by imaging for gallbladder disease. She had upper abdominal ultrasound done in August 2018 which was normal and CCK HIDA scan also normal with EF of 65%. Patient says she has tried to control her symptoms with diet for years.  If she does not eat any fatty or greasy foods she will have less issues, and also feels that chocolate exacerbates her symptoms.  She tends to have an episode 3 to 4 months which may last for a few days. Recently she feels she has had a change in her stools with more constipation and has also noticed change in color of her stool which has been more yellow.  She will occasionally have episodes of diarrhea.  She has more recently had an episode of pain that lasted almost 2 weeks, not severe but more persistent dull discomfort that comes and goes.  She uses a heating pad and it sometimes takes baking soda which may or may not offer any benefit.  She is not taking any regular aspirin or NSAIDs.  She has been on pantoprazole 20 mg p.o. daily.  Review of Systems Pertinent positive and negative review of systems were noted in the above HPI section.  All other review of systems was otherwise negative.  Outpatient Encounter Medications as of 07/27/2019  Medication Sig  . famotidine (PEPCID) 40 MG tablet Take 1 tablet (40 mg total) by mouth 2 (two) times daily.  Marland Kitchen levothyroxine (SYNTHROID) 50 MCG tablet TAKE 1 TABLET(50 MCG) BY MOUTH DAILY BEFORE BREAKFAST  . pantoprazole (PROTONIX) 20 MG tablet Take 1 tablet (20 mg total) by mouth daily.  . [DISCONTINUED] famotidine (PEPCID) 40 MG tablet Take 1 tablet (40 mg  total) by mouth 2 (two) times daily.  . [DISCONTINUED] pantoprazole (PROTONIX) 20 MG tablet Take 1 tablet (20 mg total) by mouth daily.  . hyoscyamine (LEVSIN SL) 0.125 MG SL tablet Place 1 tablet (0.125 mg total) under the tongue every 6 (six) hours as needed.  . [DISCONTINUED] Prenatal Vit-Fe Fumarate-FA (PRENATAL MULTIVITAMIN) TABS tablet Take 1 tablet by mouth daily at 12 noon.   No facility-administered encounter medications on file as of 07/27/2019.   Allergies  Allergen Reactions  . Penicillins Swelling    Has patient had a PCN reaction causing immediate rash, facial/tongue/throat swelling, SOB or lightheadedness with hypotension: Yes Has patient had a PCN reaction causing severe rash involving mucus membranes or skin necrosis: No Has patient had a PCN reaction that required hospitalization Yes Has patient had a PCN reaction occurring within the last 10 years: No If all of the above answers are "NO", then may proceed with Cephalosporin use.   Patient Active Problem List   Diagnosis Date Noted  . Encounter for cervical Pap smear with pelvic exam 03/29/2019  . Family history of diabetes mellitus (DM) 01/05/2019  . Hyperglycemia 01/04/2019  . Cholelithiasis 12/29/2016  . History of abnormal cervical Pap smear 12/25/2016  . Hypothyroidism 09/06/2016  . GERD (gastroesophageal reflux disease) 11/09/2014   Social History   Socioeconomic History  . Marital status: Married    Spouse name: Not on file  .  Number of children: 2  . Years of education: Not on file  . Highest education level: Not on file  Occupational History  . 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 Smoking status: Former Smoker    Packs/day: 0.25    Years: 3.00    Pack years: 0.75    Types: Cigarettes    Quit date: 07/14/2002    Years since quitting: 17.0  . Smokeless tobacco: Never Used  Substance and Sexual Activity  . Alcohol use: No    Comment: not now  . Drug use: No  . Sexual activity: Yes     Birth control/protection: None, Surgical    Comment: vasectomy   Other Topics Concern  . Not on file  Social History Narrative  . Not on file   Social Determinants of Health   Financial Resource Strain:   . Difficulty of Paying Living Expenses: Not on file  Food Insecurity:   . Worried About Charity fundraiser in the Last Year: Not on file  . Ran Out of Food in the Last Year: Not on file  Transportation Needs:   . Lack of Transportation (Medical): Not on file  . Lack of Transportation (Non-Medical): Not on file  Physical Activity:   . Days of Exercise per Week: Not on file  . Minutes of Exercise per Session: Not on file  Stress:   . Feeling of Stress : Not on file  Social Connections:   . Frequency of Communication with Friends and Family: Not on file  . Frequency of Social Gatherings with Friends and Family: Not on file  . Attends Religious Services: Not on file  . Active Member of Clubs or Organizations: Not on file  . Attends Archivist Meetings: Not on file  . Marital Status: Not on file  Intimate Partner Violence:   . Fear of Current or Ex-Partner: Not on file  . Emotionally Abused: Not on file  . Physically Abused: Not on file  . Sexually Abused: Not on file    Ms. Waidelich's family history includes Diabetes in her maternal aunt, maternal grandmother, mother, and paternal grandfather; Gout in her brother; Heart disease in her maternal grandfather and paternal grandfather; Hypertension in her father.      Objective:    Vitals:   07/27/19 1501  BP: 98/60  Pulse: (!) 56  Temp: 98 F (36.7 C)    Physical Exam Well-developed well-nourished female/ in no acute distress.  Height, QASTMH962, BMI21.6 HEENT; nontraumatic normocephalic, EOMI, PER R LA, sclera anicteric. Oropharynx; not examined Neck; supple, no JVD Cardiovascular; regular rate and rhythm with S1-S2, no murmur rub or gallop Pulmonary; Clear bilaterally Abdomen; soft, nontender, nondistended,  no palpable mass or hepatosplenomegaly, bowel sounds are active Rectal; not done  skin; benign exam, no jaundice rash or appreciable lesions Extremities; no clubbing cyanosis or edema skin warm and dry Neuro/Psych; alert and oriented x4, grossly nonfocal mood and affect appropriate       Assessment & Plan:   #62 39 year old white female with recurrent episodes of right upper quadrant pain over the past several years with recent increase in frequency and prolonged episodes. Previous work-up has been unrevealing with normal ultrasound and CCK HIDA scan. Etiology of symptoms is not clear, rule out IBS, rule out possible sphincter of Oddi dysfunction, rule out other intra-abdominal inflammatory process/intermittent low-grade obstruction.  #2 GERD  Plan; CBC, c-Met, sed rate, CRP, Continue Protonix 20 mg p.o. every morning Continue Pepcid 40  mg p.o. twice daily Continue to avoid known triggers Add trial of Levsin sublingual as needed Schedule for CT scan of the abdomen and pelvis with contrast.  If CT is unremarkable proceed to EGD with Dr. Henrene Pastor. Yliana Gravois Genia Harold PA-C 07/30/2019   Cc: Binnie Rail, MD

## 2019-07-30 NOTE — Progress Notes (Signed)
Reviewed

## 2019-07-31 HISTORY — PX: UPPER GI ENDOSCOPY: SHX6162

## 2019-07-31 HISTORY — PX: COLONOSCOPY: SHX174

## 2019-07-31 MED ORDER — HYOSCYAMINE SULFATE 0.125 MG PO TABS: 0.1250 mg | ORAL_TABLET | Freq: Four times a day (QID) | ORAL | 0 refills | Status: DC | PRN

## 2019-07-31 NOTE — Telephone Encounter (Signed)
Informed patient of change in medication.

## 2019-07-31 NOTE — Telephone Encounter (Signed)
Sent script into pharmacy for Hyoscyamine tablets. Left message for patient to call office to let her know of switch.

## 2019-07-31 NOTE — Telephone Encounter (Signed)
Yes, ok to switch  

## 2019-08-01 ENCOUNTER — Telehealth: Payer: Self-pay | Admitting: General Surgery

## 2019-08-01 NOTE — Telephone Encounter (Signed)
Contacted [facility] and spoke with [place], [facility]. They sent over a request to change the rx of hyoscamine but in all actuality the patients insurance will pay for it they just can not get it in stock. Patient picked up a partial fill of the rx. [facility] is uncertain of when they will get more in. They notified the patient but have not heard back from her.

## 2019-08-04 ENCOUNTER — Other Ambulatory Visit: Payer: Self-pay

## 2019-08-04 ENCOUNTER — Telehealth: Payer: Self-pay | Admitting: Physician Assistant

## 2019-08-04 ENCOUNTER — Ambulatory Visit (INDEPENDENT_AMBULATORY_CARE_PROVIDER_SITE_OTHER)
Admission: RE | Admit: 2019-08-04 | Discharge: 2019-08-04 | Disposition: A | Discharge status: Home / Self Care | Payer: BC Managed Care – PPO | Source: Ambulatory Visit | Attending: Physician Assistant | Admitting: Physician Assistant

## 2019-08-04 DIAGNOSIS — R1013 Epigastric pain: Secondary | ICD-10-CM

## 2019-08-04 DIAGNOSIS — R1011 Right upper quadrant pain: Secondary | ICD-10-CM

## 2019-08-04 DIAGNOSIS — R1110 Vomiting, unspecified: Secondary | ICD-10-CM | POA: Diagnosis not present

## 2019-08-04 MED ORDER — IOHEXOL 300 MG/ML  SOLN
100.0000 mL | Freq: Once | INTRAMUSCULAR | Status: AC | PRN
  Administered 2019-08-04: 100 mL via INTRAVENOUS

## 2019-08-04 NOTE — Telephone Encounter (Signed)
Pt is scheduled for a CT today and has lost instruction letter.

## 2019-08-04 NOTE — Telephone Encounter (Signed)
Advised NPO after 11:00 am. !st bottle at 1pm and 2nd bottle at 2pm.

## 2019-08-09 ENCOUNTER — Telehealth: Payer: Self-pay

## 2019-08-09 NOTE — Telephone Encounter (Signed)
-----   Message from Sammuel Cooper, PA-C sent at 08/08/2019 12:50 PM EST ----- Regarding: procedures Ct findings were all in distal small bowel, however her pain has been epigastric and  right - I am ok with scheduling for EGD and Colon  ----- Message ----- From: Evalee Jefferson, LPN Sent: 01/28/7077   9:12 AM EST To: Sammuel Cooper, PA-C  Hello Amy  The patient is scheduled for a colonoscopy with Dr Marina Goodell. She is asking if she needs an EGD also. She understands why the colonoscopy is needed. She asks if an EGD would be needed also.

## 2019-08-09 NOTE — Telephone Encounter (Signed)
Appointment modified to Endo/colon Patient notified by leaving her a message. (She is a Runner, broadcasting/film/video)

## 2019-08-11 ENCOUNTER — Ambulatory Visit: Payer: BC Managed Care – PPO | Admitting: Internal Medicine

## 2019-08-15 ENCOUNTER — Other Ambulatory Visit: Payer: Self-pay

## 2019-08-15 ENCOUNTER — Ambulatory Visit (AMBULATORY_SURGERY_CENTER): Payer: BC Managed Care – PPO | Admitting: *Deleted

## 2019-08-15 VITALS — Ht 62.0 in | Wt 115.0 lb

## 2019-08-15 DIAGNOSIS — R1013 Epigastric pain: Secondary | ICD-10-CM

## 2019-08-15 DIAGNOSIS — R1011 Right upper quadrant pain: Secondary | ICD-10-CM

## 2019-08-15 DIAGNOSIS — R9389 Abnormal findings on diagnostic imaging of other specified body structures: Secondary | ICD-10-CM

## 2019-08-15 MED ORDER — NA SULFATE-K SULFATE-MG SULF 17.5-3.13-1.6 GM/177ML PO SOLN: 1.0000 | Freq: Once | ORAL | 0 refills | Status: AC

## 2019-08-15 NOTE — Progress Notes (Signed)

## 2019-08-18 ENCOUNTER — Encounter: Payer: Self-pay | Admitting: Internal Medicine

## 2019-08-21 ENCOUNTER — Ambulatory Visit (AMBULATORY_SURGERY_CENTER): Payer: BC Managed Care – PPO | Admitting: Internal Medicine

## 2019-08-21 ENCOUNTER — Other Ambulatory Visit: Payer: Self-pay

## 2019-08-21 ENCOUNTER — Encounter: Payer: Self-pay | Admitting: Internal Medicine

## 2019-08-21 VITALS — BP 93/50 | HR 54 | Temp 97.5°F | Resp 18 | Ht 62.0 in | Wt 115.0 lb

## 2019-08-21 DIAGNOSIS — R1084 Generalized abdominal pain: Secondary | ICD-10-CM | POA: Diagnosis not present

## 2019-08-21 DIAGNOSIS — R9389 Abnormal findings on diagnostic imaging of other specified body structures: Secondary | ICD-10-CM

## 2019-08-21 DIAGNOSIS — G8929 Other chronic pain: Secondary | ICD-10-CM

## 2019-08-21 DIAGNOSIS — R1031 Right lower quadrant pain: Secondary | ICD-10-CM

## 2019-08-21 DIAGNOSIS — R194 Change in bowel habit: Secondary | ICD-10-CM

## 2019-08-21 DIAGNOSIS — R1011 Right upper quadrant pain: Secondary | ICD-10-CM

## 2019-08-21 DIAGNOSIS — R1013 Epigastric pain: Secondary | ICD-10-CM | POA: Diagnosis not present

## 2019-08-21 DIAGNOSIS — B3781 Candidal esophagitis: Secondary | ICD-10-CM

## 2019-08-21 MED ORDER — SODIUM CHLORIDE 0.9 % IV SOLN: 500.0000 mL | Freq: Once | INTRAVENOUS | Status: DC

## 2019-08-21 MED ORDER — FLUCONAZOLE 100 MG PO TABS: ORAL_TABLET | ORAL | 0 refills | Status: DC

## 2019-08-21 NOTE — Progress Notes (Signed)
Report to PACU, RN, vss, BBS= Clear.  

## 2019-08-21 NOTE — Progress Notes (Signed)
Temp by LC. VS by DT. 

## 2019-08-21 NOTE — Patient Instructions (Signed)
Medication for Esophageal candidiasis sent to your pharmacy -[facility] [place] ,[address]   YOU HAD AN ENDOSCOPIC PROCEDURE TODAY AT THE [facility]:   Refer to the procedure report that was given to you for any specific questions about what was found during the examination.  If the procedure report does not answer your questions, please call your gastroenterologist to clarify.  If you requested that your care partner not be given the details of your procedure findings, then the procedure report has been included in a sealed envelope for you to review at your convenience later.  YOU SHOULD EXPECT: Some feelings of bloating in the abdomen. Passage of more gas than usual.  Walking can help get rid of the air that was put into your GI tract during the procedure and reduce the bloating. If you had a lower endoscopy (such as a colonoscopy or flexible sigmoidoscopy) you may notice spotting of blood in your stool or on the toilet paper. If you underwent a bowel prep for your procedure, you may not have a normal bowel movement for a few days.  Please Note:  You might notice some irritation and congestion in your nose or some drainage.  This is from the oxygen used during your procedure.  There is no need for concern and it should clear up in a day or so.  SYMPTOMS TO REPORT IMMEDIATELY:   Following lower endoscopy (colonoscopy or flexible sigmoidoscopy):  Excessive amounts of blood in the stool  Significant tenderness or worsening of abdominal pains  Swelling of the abdomen that is new, acute  Fever of 100F or higher   Following upper endoscopy (EGD)  Vomiting of blood or coffee ground material  New chest pain or pain under the shoulder blades  Painful or persistently difficult swallowing  New shortness of breath  Fever of 100F or higher  Black, tarry-looking stools  For urgent or emergent issues, a gastroenterologist can be reached at any hour by calling [phone]  [phone].   DIET:  We do recommend a small meal at first, but then you may proceed to your regular diet.  Drink plenty of fluids but you should avoid alcoholic beverages for 24 hours.  ACTIVITY:  You should plan to take it easy for the rest of today and you should NOT DRIVE or use heavy machinery until tomorrow (because of the sedation medicines used during the test).    FOLLOW UP: Our staff will call the number listed on your records 48-72 hours following your procedure to check on you and address any questions or concerns that you may have regarding the information given to you following your procedure. If we do not reach you, we will leave a message.  We will attempt to reach you two times.  During this call, we will ask if you have developed any symptoms of COVID 19. If you develop any symptoms (ie: fever, flu-like symptoms, shortness of breath, cough etc.) before then, please call [phone].  If you test positive for Covid 19 in the 2 weeks post procedure, please call and report this information to [place].    If any biopsies were taken you will be contacted by phone or by letter within the next 1-3 weeks.  Please call us at [phone] if you have not heard about the biopsies in 3 weeks.    SIGNATURES/CONFIDENTIALITY: You and/or your care partner have signed paperwork which will be entered into your electronic medical record.  These signatures attest to the  fact that that the information above on your After Visit Summary has been reviewed and is understood.  Full responsibility of the confidentiality of this discharge information lies with you and/or your care-partner.

## 2019-08-21 NOTE — Progress Notes (Signed)
Pt's states no medical or surgical changes since previsit or office visit. 

## 2019-08-21 NOTE — Op Note (Signed)
[facility] Patient Name: Mindy Bright Procedure Date: 08/21/2019 9:42 AM MRN: [id number] Endoscopist: Wilhemina Bonito. Marina Goodell , MD Age: 39 Referring MD:  Date of Birth: 01/27/1981 Gender: Female Account #: [id number] Procedure:                Colonoscopy Indications:              Abdominal pain in the right lower quadrant,                            Abnormal CT of the GI tract (question abnormality                            of the cecum and distal ileum), Change in bowel                            habits Medicines:                Monitored Anesthesia Care Procedure:                Pre-Anesthesia Assessment:                           - Prior to the procedure, a History and Physical                            was performed, and patient medications and                            allergies were reviewed. The patient's tolerance of                            previous anesthesia was also reviewed. The risks                            and benefits of the procedure and the sedation                            options and risks were discussed with the patient.                            All questions were answered, and informed consent                            was obtained. Prior Anticoagulants: The patient has                            taken no previous anticoagulant or antiplatelet                            agents. ASA Grade Assessment: I - A normal, healthy                            patient. After reviewing the risks and benefits,  the patient was deemed in satisfactory condition to                            undergo the procedure.                           After obtaining informed consent, the colonoscope                            was passed under direct vision. Throughout the                            procedure, the patient's blood pressure, pulse, and                            oxygen saturations were monitored continuously. The       Colonoscope was introduced through the anus and                            advanced to the the cecum, identified by                            appendiceal orifice and ileocecal valve. The                            terminal ileum, ileocecal valve, appendiceal                            orifice, and rectum were photographed. The quality                            of the bowel preparation was excellent. The                            colonoscopy was performed without difficulty. The                            patient tolerated the procedure well. The bowel                            preparation used was SUPREP via split dose                            instruction. Scope In: 9:52:21 AM Scope Out: 10:01:42 AM Scope Withdrawal Time: 0 hours 6 minutes 36 seconds  Total Procedure Duration: 0 hours 9 minutes 21 seconds  Findings:                 The terminal ileum appeared normal.                           The entire examined colon appeared normal on direct                            and retroflexion views. Complications:  No immediate complications. Estimated blood loss:                            None. Estimated Blood Loss:     Estimated blood loss: none. Impression:               - The examined portion of the ileum was normal.                           - The entire examined colon is normal on direct and                            retroflexion views.                           - No specimens collected. Recommendation:           - Repeat colonoscopy at age 35 for screening                            purposes.                           - Patient has a contact number available for                            emergencies. The signs and symptoms of potential                            delayed complications were discussed with the                            patient. Return to normal activities tomorrow.                            Written discharge instructions were provided to the                             patient.                           - Resume previous diet.                           - Continue present medications. Wilhemina Bonito. Marina Goodell, MD 08/21/2019 10:05:28 AM This report has been signed electronically.

## 2019-08-21 NOTE — Op Note (Signed)
[facility] Patient Name: Mindy Bright Procedure Date: 08/21/2019 9:42 AM MRN: [id number] Endoscopist: Docia Chuck. Henrene Pastor , MD Age: 39 Referring MD:  Date of Birth: 1981-01-11 Gender: Female Account #: [id number] Procedure:                Upper GI endoscopy Indications:              Abdominal pain Medicines:                Monitored Anesthesia Care Procedure:                Pre-Anesthesia Assessment:                           - Prior to the procedure, a History and Physical                            was performed, and patient medications and                            allergies were reviewed. The patient's tolerance of                            previous anesthesia was also reviewed. The risks                            and benefits of the procedure and the sedation                            options and risks were discussed with the patient.                            All questions were answered, and informed consent                            was obtained. Prior Anticoagulants: The patient has                            taken no previous anticoagulant or antiplatelet                            agents. ASA Grade Assessment: I - A normal, healthy                            patient. After reviewing the risks and benefits,                            the patient was deemed in satisfactory condition to                            undergo the procedure.                           After obtaining informed consent, the endoscope was  passed under direct vision. Throughout the                            procedure, the patient's blood pressure, pulse, and                            oxygen saturations were monitored continuously. The                            Endoscope was introduced through the mouth, and                            advanced to the second part of duodenum. The upper                            GI endoscopy was accomplished without difficulty.                           The patient tolerated the procedure well. Scope In: Scope Out: Findings:                 Patchy, white plaques were found in the upper third                            of the esophagus, consistent with mild esophageal                            candidiasis.                           The esophagus was otherwise normal.                           The stomach was normal.                           The examined duodenum was normal.                           The cardia and gastric fundus were normal on                            retroflexion. Complications:            No immediate complications. Estimated Blood Loss:     Estimated blood loss: none. Impression:               - Esophageal plaques were found, consistent with                            mild candidiasis.                           - Normal esophagus otherwise.                           - Normal stomach.                           -  Normal examined duodenum.                           - No specimens collected. Recommendation:           - Patient has a contact number available for                            emergencies. The signs and symptoms of potential                            delayed complications were discussed with the                            patient. Return to normal activities tomorrow.                            Written discharge instructions were provided to the                            patient.                           - Resume previous diet.                           - Continue present medications.                           - Prescribe Diflucan (fluconazole) 100 mg; dispense                            6; take 2 by mouth on day 1 then 1 daily until                            completed Laverne Klugh N. Marina Goodell, MD 08/21/2019 10:15:44 AM This report has been signed electronically.

## 2019-08-23 ENCOUNTER — Telehealth: Payer: Self-pay | Admitting: *Deleted

## 2019-08-23 NOTE — Telephone Encounter (Signed)
  Follow up Call-  Call back number 08/21/2019  Post procedure Call Back phone  # [phone]  Permission to leave phone message Yes  Some recent data might be hidden     Patient questions:  Do you have a fever, pain , or abdominal swelling? No. Pain Score  0 *  Have you tolerated food without any problems? Yes.    Have you been able to return to your normal activities? Yes.    Do you have any questions about your discharge instructions: Diet   No. Medications  No. Follow up visit  No.  Do you have questions or concerns about your Care? No.  Actions: * If pain score is 4 or above: No action needed, pain <4.  1. Have you developed a fever since your procedure? no  2.   Have you had an respiratory symptoms (SOB or cough) since your procedure? no  3.   Have you tested positive for COVID 19 since your procedure no  4.   Have you had any family members/close contacts diagnosed with the COVID 19 since your procedure?  no   If yes to any of these questions please route to Laverna Peace, RN and Jennye Boroughs, Charity fundraiser.

## 2019-09-08 ENCOUNTER — Telehealth: Payer: Self-pay | Admitting: Internal Medicine

## 2019-09-08 NOTE — Telephone Encounter (Signed)
Pt stated that she has had EGD and colon but she is still experiencing abdominal pain.  Please advise.

## 2019-09-08 NOTE — Telephone Encounter (Signed)
Pt reports she is still having RUQ pain, cannot eat anything that is not low fat due to abd pain. Also states her stool is irregular, yellow in color. Pt scheduled to see Noel Gerold NP 09/15/19 at 2:30pm. Pt aware of appt.

## 2019-09-15 ENCOUNTER — Ambulatory Visit: Payer: BC Managed Care – PPO | Admitting: Nurse Practitioner

## 2019-09-27 ENCOUNTER — Ambulatory Visit: Payer: BC Managed Care – PPO | Admitting: Gastroenterology

## 2019-09-27 ENCOUNTER — Encounter: Payer: Self-pay | Admitting: Gastroenterology

## 2019-09-27 ENCOUNTER — Other Ambulatory Visit (INDEPENDENT_AMBULATORY_CARE_PROVIDER_SITE_OTHER): Payer: BC Managed Care – PPO

## 2019-09-27 VITALS — BP 108/64 | HR 67 | Temp 98.5°F | Ht 62.0 in | Wt 116.0 lb

## 2019-09-27 DIAGNOSIS — R1011 Right upper quadrant pain: Secondary | ICD-10-CM | POA: Diagnosis not present

## 2019-09-27 LAB — IGA: IgA: 202 mg/dL (ref 68–378)

## 2019-09-27 NOTE — Progress Notes (Signed)
Assessment and plan reviewed 

## 2019-09-27 NOTE — Patient Instructions (Addendum)
You have been scheduled for an appointment with ______________ at [facility]. Your appointment is on ____________________ at __________________. Please arrive at _______________ for registration. Make certain to bring a list of current medications, including any over the counter medications or vitamins. Also bring your co-pay if you have one as well as your insurance cards. [facility] is located at [address], [address]. Should you need to reschedule your appointment, please contact them at [phone].  Your provider has requested that you go to the basement level for lab work before leaving today. Press "B" on the elevator. The lab is located at the first door on the left as you exit the elevator.   Due to recent changes in healthcare laws, you may see the results of your imaging and laboratory studies on MyChart before your provider has had a chance to review them.  We understand that in some cases there may be results that are confusing or concerning to you. Not all laboratory results come back in the same time frame and the provider may be waiting for multiple results in order to interpret others.  Please give [place] 48 hours in order for your provider to thoroughly review all the results before contacting the office for clarification of your results.    You have been scheduled for a HIDA scan at [facility] (1st floor) on 10/13/2019. Please arrive 15 minutes prior to your scheduled appointment at 73:71GG. Make certain not to have anything to eat or drink at least 6 hours prior to your test. Should this appointment date or time not work well for you, please call radiology scheduling at [phone].  _____________________________________________________________________ hepatobiliary (HIDA) scan is an imaging procedure used to diagnose problems in the liver, gallbladder and bile ducts. In the HIDA scan, a radioactive chemical or tracer is injected into a vein  in your arm. The tracer is handled by the liver like bile. Bile is a fluid produced and excreted by your liver that helps your digestive system break down fats in the foods you eat. Bile is stored in your gallbladder and the gallbladder releases the bile when you eat a meal. A special nuclear medicine scanner (gamma camera) tracks the flow of the tracer from your liver into your gallbladder and small intestine.  During your HIDA scan  You'll be asked to change into a hospital gown before your HIDA scan begins. Your health care team will position you on a table, usually on your back. The radioactive tracer is then injected into a vein in your arm.The tracer travels through your bloodstream to your liver, where it's taken up by the bile-producing cells. The radioactive tracer travels with the bile from your liver into your gallbladder and through your bile ducts to your small intestine.You may feel some pressure while the radioactive tracer is injected into your vein. As you lie on the table, a special gamma camera is positioned over your abdomen taking pictures of the tracer as it moves through your body. The gamma camera takes pictures continually for about an hour. You'll need to keep still during the HIDA scan. This can become uncomfortable, but you may find that you can lessen the discomfort by taking deep breaths and thinking about other things. Tell your health care team if you're uncomfortable. The radiologist will watch on a computer the progress of the radioactive tracer through your body. The HIDA scan may be stopped when the radioactive tracer is seen in the gallbladder and enters  your small intestine. This typically takes about an hour. In some cases extra imaging will be performed if original images aren't satisfactory, if morphine is given to help visualize the gallbladder or if the medication CCK is given to look at the contraction of the gallbladder. This test typically takes 2 hours to  complete. ________________________________________________________________________   I appreciate the opportunity to care for you. Alonza Bogus, PA-C

## 2019-09-27 NOTE — Progress Notes (Signed)
09/27/2019 Mindy Bright [id number] Nov 19, 1980   HISTORY OF PRESENT ILLNESS: This is a 39 year old female who is a patient of Dr. Lamar Sprinkles.  She is here today with complaints of right upper quadrant abdominal pain.  She says that she had this issue 10 years ago or so and was told that she had abnormal HIDA scan they recommended cholecystectomy.  She did not want to have cholecystectomy at that time so changed her diet and her symptoms got better.  Now recently she has been having same type of symptoms.  Mostly right upper quadrant abdominal pain when she eats fatty or greasy foods.  She says if she avoids those foods then she does not have any issues for most part.  She had another HIDA scan here in 2018 that was then normal, however.  Since then she has had other extensive GI evaluation including a CT scan, EGD, and colonoscopy, which have all been unremarkable.   Past Medical History:  Diagnosis Date  . Abnormal Pap smear   . Anemia   . Anxiety   . Gall bladder disease   . GERD (gastroesophageal reflux disease)   . H/O hiatal hernia   . Irregular periods 04/30/2014  . Patient desires pregnancy 12/11/2013  . Pregnant 10/26/2014  . Reflux 11/09/2014  . Thyroid disease   . Unspecified symptom associated with female genital organs 12/11/2013  . Vaginal discharge 12/11/2013  . Vaginal Pap smear, abnormal    >10 years ago, no treatment   Past Surgical History:  Procedure Laterality Date  . WISDOM TOOTH EXTRACTION      reports that she quit smoking about 17 years ago. Her smoking use included cigarettes. She has a 0.75 pack-year smoking history. She has never used smokeless tobacco. She reports that she does not drink alcohol or use drugs. family history includes Diabetes in her maternal aunt, maternal grandmother, mother, and paternal grandfather; Gout in her brother; Heart disease in her maternal grandfather and paternal grandfather; Hypertension in her father. Allergies  Allergen  Reactions  . Penicillins Swelling    Has patient had a PCN reaction causing immediate rash, facial/tongue/throat swelling, SOB or lightheadedness with hypotension: Yes Has patient had a PCN reaction causing severe rash involving mucus membranes or skin necrosis: No Has patient had a PCN reaction that required hospitalization Yes Has patient had a PCN reaction occurring within the last 10 years: No If all of the above answers are "NO", then may proceed with Cephalosporin use.      Outpatient Encounter Medications as of 09/27/2019  Medication Sig  . famotidine (PEPCID) 40 MG tablet Take 1 tablet (40 mg total) by mouth 2 (two) times daily.  . hyoscyamine (LEVSIN) 0.125 MG tablet Take 1 tablet (0.125 mg total) by mouth every 6 (six) hours as needed.  Marland Kitchen levothyroxine (SYNTHROID) 50 MCG tablet TAKE 1 TABLET(50 MCG) BY MOUTH DAILY BEFORE BREAKFAST  . [DISCONTINUED] fluconazole (DIFLUCAN) 100 MG tablet Diflucan 100 mg ,dispense 6: take 2 by mouth on day 1 ,then 1 daily until completed  . [DISCONTINUED] pantoprazole (PROTONIX) 20 MG tablet Take 1 tablet (20 mg total) by mouth daily.   No facility-administered encounter medications on file as of 09/27/2019.     REVIEW OF SYSTEMS  : All other systems reviewed and negative except where noted in the History of Present Illness.   PHYSICAL EXAM: BP 108/64   Pulse 67   Temp 98.5 F (36.9 C)   Ht 5\' 2"  (1.575 m)  Wt 116 lb (52.6 kg)   BMI 21.22 kg/m  General: Well developed white female in no acute distress Head: Normocephalic and atraumatic Eyes:  Sclerae anicteric, conjunctiva pink. Ears: Normal auditory acuity Lungs: Clear throughout to auscultation; no increased WOB. Heart: Regular rate and rhythm; no M/R/G. Abdomen: Soft, non-distended.  BS present.  Non-tender. Musculoskeletal: Symmetrical with no gross deformities  Skin: No lesions on visible extremities Extremities: No edema  Neurological: Alert oriented x 4, grossly  non-focal Psychological:  Alert and cooperative. Normal mood and affect  ASSESSMENT AND PLAN: *RUQ abdominal pain:  Sounds biliary.  Says that years ago she had a HIDA scan with EF in the 20's and they recommended cholecystectomy, but she just changed her diet and did better for a while.  In 2018 HIDA showed normal EF.  Continues to have episodic RUQ abdominal pain when eating fatty or greasy food.  Will plan for repeat HIDA scan.  Will refer to surgery as well because it certainly sounds biliary in nature and other extensive evaluation is negative.  Will check celiac labs as well although I think this is likely going to be negative with normal appearing duodenum on recent EGD.   CC:  Binnie Rail, MD

## 2019-09-28 LAB — TISSUE TRANSGLUTAMINASE, IGA: (tTG) Ab, IgA: 2 U/mL

## 2019-10-06 ENCOUNTER — Ambulatory Visit: Payer: BC Managed Care – PPO | Admitting: Gastroenterology

## 2019-10-13 ENCOUNTER — Other Ambulatory Visit: Payer: Self-pay

## 2019-10-13 ENCOUNTER — Ambulatory Visit (HOSPITAL_COMMUNITY)
Admission: RE | Admit: 2019-10-13 | Discharge: 2019-10-13 | Disposition: A | Discharge status: Home / Self Care | Payer: BC Managed Care – PPO | Source: Ambulatory Visit | Attending: Gastroenterology | Admitting: Gastroenterology

## 2019-10-13 DIAGNOSIS — R1011 Right upper quadrant pain: Secondary | ICD-10-CM | POA: Insufficient documentation

## 2019-10-13 MED ORDER — TECHNETIUM TC 99M MEBROFENIN IV KIT
5.1000 | PACK | Freq: Once | INTRAVENOUS | Status: AC | PRN
  Administered 2019-10-13: 5.1 via INTRAVENOUS

## 2019-10-27 ENCOUNTER — Ambulatory Visit: Payer: Self-pay | Admitting: Surgery

## 2019-10-27 DIAGNOSIS — K811 Chronic cholecystitis: Secondary | ICD-10-CM | POA: Diagnosis not present

## 2019-10-27 NOTE — H&P (Signed)
History of Present Illness Mindy Bright. Mindy Horn MD; 10/27/2019 12:41 PM) The patient is a 39 year old female who presents with abdominal pain.PCP - Billey Gosling, MD Referred by Alonza Bogus, PA-C at [facility]  This is a healthy 39 year old female who presents with over 10 years of intermittent epigastric and right upper quadrant abdominal pain. This tends to be postprandial, especially after fatty or greasy foods. She has minimal associated nausea or vomiting but does have intermittent diarrhea. She has had multiple tests over the years. She states that 8 years ago, she had a HIDA scan in [place], [place], that yielded a gallbladder ejection fraction of 28%. At that time, she was too nervous to have surgery. She has had subsequent HIDA scans that showed normal EF with the most recent at 54%. She does report some bloating. Recently, she has noticed some change in her bowel movements with more mucus. She denies any change in the color of her urine or bowel movements. She recently had a CT scan that showed some possible thickening of the terminal ileum. She underwent EGD and colonoscopy after that scan. Her colon and terminal ileum appeared normal. Upper endoscopy showed some esophageal candidiasis but otherwise normal. Because of her ongoing symptoms and her lack of other diagnostic findings, she is referred to [place] to discuss possible elective cholecystectomy.   Problem List/Past Medical Mindy Key K. Shyne Lehrke, MD; 10/27/2019 12:41 PM) CHRONIC CHOLECYSTITIS WITHOUT CALCULUS (K81.1)  Past Surgical History Mammie Lorenzo, Bright; 1/91/4782 95:62 AM) No pertinent past surgical history  Diagnostic Studies History Mammie Lorenzo, Bright; 07/29/8655 84:69 AM) Colonoscopy within last year Mammogram never Pap Smear 1-5 years ago  Allergies Mammie Lorenzo, Bright; 12/26/5282 13:24 AM) Penicillins Swelling. Allergies Reconciled  Medication History Mammie Lorenzo, Bright; 09/28/270 53:66 AM) Levothyroxine Sodium  (50MCG Tablet, Oral) Active. Famotidine (40MG  Tablet, Oral) Active. Medications Reconciled  Social History Mammie Lorenzo, Bright; 4/40/3474 25:95 AM) Alcohol use Occasional alcohol use. Caffeine use Tea. No drug use Tobacco use Former smoker.  Family History Mammie Lorenzo, Bright; 6/38/7564 33:29 AM) Diabetes Mellitus Mother. Heart disease in female family member before age 28 Hypertension Father.  Pregnancy / Birth History Mammie Lorenzo, Bright; 11/14/8414 60:63 AM) Age at menarche 83 years. Gravida 2 Length (months) of breastfeeding 12-24 Maternal age 76-35 Para 2 Regular periods  Other Problems Mindy Bright. Mindy Gappa, MD; 10/27/2019 12:41 PM) Anxiety Disorder Gastroesophageal Reflux Disease Thyroid Disease     Review of Systems Mindy Bright; 0/16/0109 32:35 AM) General Not Present- Appetite Loss, Chills, Fatigue, Fever, Night Sweats, Weight Gain and Weight Loss. Skin Not Present- Change in Wart/Mole, Dryness, Hives, Jaundice, New Lesions, Non-Healing Wounds, Rash and Ulcer. HEENT Present- Seasonal Allergies. Not Present- Earache, Hearing Loss, Hoarseness, Nose Bleed, Oral Ulcers, Ringing in the Ears, Sinus Pain, Sore Throat, Visual Disturbances, Wears glasses/contact lenses and Yellow Eyes. Respiratory Not Present- Bloody sputum, Chronic Cough, Difficulty Breathing, Snoring and Wheezing. Breast Not Present- Breast Mass, Breast Pain, Nipple Discharge and Skin Changes. Cardiovascular Not Present- Chest Pain, Difficulty Breathing Lying Down, Leg Cramps, Palpitations, Rapid Heart Rate, Shortness of Breath and Swelling of Extremities. Gastrointestinal Present- Abdominal Pain, Change in Bowel Habits, Constipation, Excessive gas and Indigestion. Not Present- Bloating, Bloody Stool, Chronic diarrhea, Difficulty Swallowing, Gets full quickly at meals, Hemorrhoids, Nausea, Rectal Pain and Vomiting. Female Genitourinary Not Present- Frequency, Nocturia, Painful Urination,  Pelvic Pain and Urgency. Musculoskeletal Not Present- Back Pain, Joint Pain, Joint Stiffness, Muscle Pain, Muscle Weakness and Swelling of Extremities. Neurological Not Present- Decreased Memory, Fainting,  Headaches, Numbness, Seizures, Tingling, Tremor, Trouble walking and Weakness. Psychiatric Not Present- Anxiety, Bipolar, Change in Sleep Pattern, Depression, Fearful and Frequent crying. Endocrine Not Present- Cold Intolerance, Excessive Hunger, Hair Changes, Heat Intolerance, Hot flashes and New Diabetes. Hematology Not Present- Blood Thinners, Easy Bruising, Excessive bleeding, Gland problems, HIV and Persistent Infections.  Vitals Mindy Bright; 8/33/8250 53:97 AM) 10/27/2019 11:07 AM Weight: 116.4 lb Height: 62in Body Surface Area: 1.52 m Body Mass Index: 21.29 kg/m  Temp.: 98.1F(Thermal Scan)  Pulse: 98 (Regular)  BP: 112/62(Sitting, Left Arm, Standard)        Physical Exam Mindy Hazard K. Latavia Goga MD; 10/27/2019 12:42 PM)  The physical exam findings are as follows: Note:Constitutional: WDWN in NAD, conversant, no obvious deformities Eyes: Pupils equal, round; sclera anicteric; moist conjunctiva; no lid lag HENT: Oral mucosa moist; good dentition Neck: No masses palpated, trachea midline; no thyromegaly Lungs: CTA bilaterally; normal respiratory effort CV: Regular rate and rhythm; no murmurs; extremities well-perfused with no edema Abd: +bowel sounds, soft, mildly tender in RUQ, no palpable organomegaly; no palpable hernias; healed scar at upper edge of umbilicus from previous piercing Musc: Normal gait; no apparent clubbing or cyanosis in extremities Lymphatic: No palpable cervical or axillary lymphadenopathy Skin: Warm, dry; no sign of jaundice Psychiatric - alert and oriented x 4; calm mood and affect    Assessment & Plan Mindy Hazard K. Adriene Padula MD; 10/27/2019 12:44 PM)  CHRONIC CHOLECYSTITIS WITHOUT CALCULUS (K81.1)  Current Plans Please call us back at 336  [phone] and let us know if he would like to proceed with surgery.  Schedule for Surgery - laparoscopic cholecystectomy with intraoperative cholangiogram. The surgical procedure has been discussed with the patient. Potential risks, benefits, alternative treatments, and expected outcomes have been explained. All of the patient's questions at this time have been answered. The likelihood of reaching the patient's treatment goal is good. The patient understand the proposed surgical procedure and wishes to proceed.  Note:We spent a considerable amount of time discussing gallbladder disease and possible surgery. Obviously, she is fairly nervous about having surgery without clear indications from diagnostic studies. However her symptoms are classic for gallbladder disease. She has had extensive testing that has yielded no significant findings that would explain her symptoms. I explained to the patient that we do see a few patients each year that presented with symptoms but no diagnostic findings consistent with gallbladder disease. After thorough discussion, we often proceed with cholecystectomy and these patients have significant improvement in her symptoms. However, there is no guarantee that this is the only source of their abdominal symptoms. She wants to talk to her husband about this but feels that she will likely proceed with cholecystectomy as she personally feels that she does have gallbladder disease.  Mindy Arms. Corliss Skains, MD, [facility]  General/ Trauma Surgery   10/27/2019 12:44 PM

## 2020-01-19 ENCOUNTER — Inpatient Hospital Stay (HOSPITAL_COMMUNITY): Admission: RE | Admit: 2020-01-19 | Payer: BC Managed Care – PPO | Source: Ambulatory Visit

## 2020-01-19 ENCOUNTER — Encounter (HOSPITAL_COMMUNITY): Payer: Self-pay | Admitting: Surgery

## 2020-01-19 NOTE — Progress Notes (Signed)
Patient returned my call.  She verified that she is cancelling her 01/23/20 surgery with Dr Corliss Skains.  Patient stated she had already informed Dr Fatima Sanger office.

## 2020-01-23 ENCOUNTER — Ambulatory Visit (HOSPITAL_COMMUNITY): Admission: RE | Admit: 2020-01-23 | Payer: BC Managed Care – PPO | Source: Home / Self Care | Admitting: Surgery

## 2020-01-23 SURGERY — LAPAROSCOPIC CHOLECYSTECTOMY WITH INTRAOPERATIVE CHOLANGIOGRAM
Anesthesia: General

## 2020-02-05 ENCOUNTER — Other Ambulatory Visit: Payer: Self-pay | Admitting: Internal Medicine

## 2020-02-29 ENCOUNTER — Ambulatory Visit (INDEPENDENT_AMBULATORY_CARE_PROVIDER_SITE_OTHER): Payer: BC Managed Care – PPO | Admitting: Internal Medicine

## 2020-02-29 ENCOUNTER — Other Ambulatory Visit: Payer: Self-pay

## 2020-02-29 ENCOUNTER — Encounter: Payer: Self-pay | Admitting: Internal Medicine

## 2020-02-29 VITALS — BP 124/78 | HR 87 | Temp 98.6°F | Ht 62.0 in | Wt 122.0 lb

## 2020-02-29 DIAGNOSIS — R202 Paresthesia of skin: Secondary | ICD-10-CM | POA: Insufficient documentation

## 2020-02-29 DIAGNOSIS — E039 Hypothyroidism, unspecified: Secondary | ICD-10-CM

## 2020-02-29 DIAGNOSIS — R739 Hyperglycemia, unspecified: Secondary | ICD-10-CM

## 2020-02-29 NOTE — Patient Instructions (Signed)
Hypothyroidism  Hypothyroidism is when the thyroid gland does not make enough of certain hormones (it is underactive). The thyroid gland is a small gland located in the lower front part of the neck, just in front of the windpipe (trachea). This gland makes hormones that help control how the body uses food for energy (metabolism) as well as how the heart and brain function. These hormones also play a role in keeping your bones strong. When the thyroid is underactive, it produces too little of the hormones thyroxine (T4) and triiodothyronine (T3). What are the causes? This condition may be caused by:  Hashimoto's disease. This is a disease in which the body's disease-fighting system (immune system) attacks the thyroid gland. This is the most common cause.  Viral infections.  Pregnancy.  Certain medicines.  Birth defects.  Past radiation treatments to the head or neck for cancer.  Past treatment with radioactive iodine.  Past exposure to radiation in the environment.  Past surgical removal of part or all of the thyroid.  Problems with a gland in the center of the brain (pituitary gland).  Lack of enough iodine in the diet. What increases the risk? You are more likely to develop this condition if:  You are female.  You have a family history of thyroid conditions.  You use a medicine called lithium.  You take medicines that affect the immune system (immunosuppressants). What are the signs or symptoms? Symptoms of this condition include:  Feeling as though you have no energy (lethargy).  Not being able to tolerate cold.  Weight gain that is not explained by a change in diet or exercise habits.  Lack of appetite.  Dry skin.  Coarse hair.  Menstrual irregularity.  Slowing of thought processes.  Constipation.  Sadness or depression. How is this diagnosed? This condition may be diagnosed based on:  Your symptoms, your medical history, and a physical exam.  Blood  tests. You may also have imaging tests, such as an ultrasound or MRI. How is this treated? This condition is treated with medicine that replaces the thyroid hormones that your body does not make. After you begin treatment, it may take several weeks for symptoms to go away. Follow these instructions at home:  Take over-the-counter and prescription medicines only as told by your health care provider.  If you start taking any new medicines, tell your health care provider.  Keep all follow-up visits as told by your health care provider. This is important. ? As your condition improves, your dosage of thyroid hormone medicine may change. ? You will need to have blood tests regularly so that your health care provider can monitor your condition. Contact a health care provider if:  Your symptoms do not get better with treatment.  You are taking thyroid replacement medicine and you: ? Sweat a lot. ? Have tremors. ? Feel anxious. ? Lose weight rapidly. ? Cannot tolerate heat. ? Have emotional swings. ? Have diarrhea. ? Feel weak. Get help right away if you have:  Chest pain.  An irregular heartbeat.  A rapid heartbeat.  Difficulty breathing. Summary  Hypothyroidism is when the thyroid gland does not make enough of certain hormones (it is underactive).  When the thyroid is underactive, it produces too little of the hormones thyroxine (T4) and triiodothyronine (T3).  The most common cause is Hashimoto's disease, a disease in which the body's disease-fighting system (immune system) attacks the thyroid gland. The condition can also be caused by viral infections, medicine, pregnancy, or past   radiation treatment to the head or neck.  Symptoms may include weight gain, dry skin, constipation, feeling as though you do not have energy, and not being able to tolerate cold.  This condition is treated with medicine to replace the thyroid hormones that your body does not make. This information  is not intended to replace advice given to you by your health care provider. Make sure you discuss any questions you have with your health care provider. Document Revised: 05/28/2017 Document Reviewed: 05/26/2017 Elsevier Patient Education  2020 [employer]  

## 2020-02-29 NOTE — Progress Notes (Signed)
Subjective:  Patient ID: Mindy Bright, female    DOB: [birth year]  Age: 39 y.o. MRN: [id number]  CC: Hypothyroidism  This visit occurred during the SARS-CoV-2 public health emergency.  Safety protocols were in place, including screening questions prior to the visit, additional usage of staff PPE, and extensive cleaning of exam room while observing appropriate contact time as indicated for disinfecting solutions.   NEW TO ME  HPI Mindy Bright presents for f/up - She complains of a 3-day history of numbness and tingling in her fingertips and feet.  She says the abnormal sensations are static distally and have not migrated proximally.  She denies weakness, incoordination, back pain, headache, blurred vision, slurred speech, ataxia, rash, sore throat, nausea, vomiting, fever, or chills.  She has had a vague, mild discomfort on the left lateral aspect of her neck.  Outpatient Medications Prior to Visit  Medication Sig Dispense Refill  . famotidine (PEPCID) 40 MG tablet Take 1 tablet (40 mg total) by mouth 2 (two) times daily. 180 tablet 3  . hyoscyamine (LEVSIN) 0.125 MG tablet Take 1 tablet (0.125 mg total) by mouth every 6 (six) hours as needed. 90 tablet 0  . levothyroxine (SYNTHROID) 50 MCG tablet TAKE 1 TABLET(50 MCG) BY MOUTH DAILY BEFORE BREAKFAST Annual appt w/labs is due must see provider for future refills 30 tablet 0   No facility-administered medications prior to visit.    ROS Review of Systems  Constitutional: Negative.  Negative for appetite change, diaphoresis, fatigue and unexpected weight change.  HENT: Negative.  Negative for sore throat and trouble swallowing.   Eyes: Negative.   Respiratory: Negative.  Negative for cough, chest tightness, shortness of breath and wheezing.   Gastrointestinal: Negative for abdominal pain, blood in stool, constipation, diarrhea, nausea and vomiting.  Endocrine: Negative.  Negative for cold intolerance and heat intolerance.    Genitourinary: Negative.  Negative for difficulty urinating, dysuria, hematuria and urgency.  Musculoskeletal: Positive for neck pain. Negative for arthralgias and myalgias.  Skin: Negative.  Negative for color change.  Neurological: Positive for numbness. Negative for dizziness, tremors, syncope, speech difficulty, weakness, light-headedness and headaches.  Hematological: Negative for adenopathy. Does not bruise/bleed easily.  Psychiatric/Behavioral: Negative.     Objective:  BP 124/78   Pulse 87   Temp 98.6 F (37 C) (Oral)   Ht 5\' 2"  (1.575 m)   Wt 122 lb (55.3 kg)   SpO2 99%   BMI 22.31 kg/m   BP Readings from Last 3 Encounters:  02/29/20 124/78  09/27/19 108/64  08/21/19 (!) 93/50    Wt Readings from Last 3 Encounters:  02/29/20 122 lb (55.3 kg)  09/27/19 116 lb (52.6 kg)  08/21/19 115 lb (52.2 kg)    Physical Exam Vitals reviewed.  Constitutional:      Appearance: Normal appearance.  HENT:     Nose: Nose normal.     Mouth/Throat:     Mouth: Mucous membranes are moist.  Eyes:     General: No scleral icterus.       Right eye: No discharge.        Left eye: No discharge.     Extraocular Movements: Extraocular movements intact.     Conjunctiva/sclera: Conjunctivae normal.     Pupils: Pupils are equal, round, and reactive to light.  Cardiovascular:     Rate and Rhythm: Normal rate and regular rhythm.     Heart sounds: No murmur heard.   Pulmonary:     Effort: Pulmonary  effort is normal.     Breath sounds: No stridor. No wheezing, rhonchi or rales.  Abdominal:     General: Abdomen is flat. Bowel sounds are normal. There is no distension.     Palpations: Abdomen is soft. There is no hepatomegaly or mass.     Tenderness: There is no abdominal tenderness.  Musculoskeletal:        General: Normal range of motion.     Cervical back: Neck supple.     Right lower leg: No edema.     Left lower leg: No edema.  Lymphadenopathy:     Cervical: No cervical  adenopathy.  Skin:    General: Skin is warm and dry.     Findings: No rash.  Neurological:     General: No focal deficit present.     Mental Status: She is alert.     Cranial Nerves: Cranial nerves are intact. No dysarthria or facial asymmetry.     Sensory: Sensation is intact.     Motor: Motor function is intact. No weakness, tremor, atrophy or abnormal muscle tone.     Coordination: Coordination is intact. Romberg sign negative. Coordination normal. Finger-Nose-Finger Test and Heel to [facility] Test normal. Rapid alternating movements normal.     Gait: Gait is intact. Gait and tandem walk normal.     Deep Tendon Reflexes: Reflexes normal. Babinski sign absent on the right side. Babinski sign absent on the left side.     Reflex Scores:      Tricep reflexes are 1+ on the right side and 1+ on the left side.      Bicep reflexes are 1+ on the right side and 1+ on the left side.      Brachioradialis reflexes are 2+ on the right side.      Patellar reflexes are 2+ on the right side and 2+ on the left side.      Achilles reflexes are 1+ on the right side and 1+ on the left side.    Lab Results  Component Value Date   WBC 11.4 (H) 07/27/2019   HGB 13.1 07/27/2019   HCT 39.0 07/27/2019   PLT 356.0 07/27/2019   GLUCOSE 94 07/27/2019   CHOL 181 01/05/2019   TRIG 108.0 01/05/2019   HDL 56.30 01/05/2019   LDLCALC 103 (H) 01/05/2019   ALT 10 07/27/2019   AST 14 07/27/2019   NA 135 07/27/2019   K 4.0 07/27/2019   CL 101 07/27/2019   CREATININE 0.72 07/27/2019   BUN 16 07/27/2019   CO2 29 07/27/2019   TSH 3.79 01/05/2019   HGBA1C 5.6 01/05/2019    NM Hepato W/EjeCT Fract  Result Date: 10/13/2019 CLINICAL DATA:  RIGHT upper quadrant abdominal pain EXAM: NUCLEAR MEDICINE HEPATOBILIARY IMAGING WITH GALLBLADDER EF TECHNIQUE: Sequential images of the abdomen were obtained out to 60 minutes following intravenous administration of radiopharmaceutical. After oral ingestion of Ensure, gallbladder  ejection fraction was determined. At 60 min, normal ejection fraction is greater than 33%. RADIOPHARMACEUTICALS:  5.1 mCi Tc-33m  Choletec IV COMPARISON:  02/09/2017 FINDINGS: Normal tracer extraction from bloodstream indicating normal hepatocellular function. Normal excretion of tracer into biliary tree. Gallbladder visualized at 11 min. Small bowel visualized at 15 min. No hepatic retention of tracer. Subjectively normal emptying of tracer from gallbladder following fatty meal stimulation. Calculated gallbladder ejection fraction is 54%, normal. Patient reported no symptoms following Ensure ingestion. Normal gallbladder ejection fraction following Ensure ingestion is greater than 33% at 1 hour. IMPRESSION: Patent biliary  tree with a normal gallbladder ejection fraction of 54% following fatty meal stimulation. Prior exam was also04/16/2021 17:31  normal, with a gallbladder ejection fraction of 65%. Electronically Signed   By: Ulyses Southward M.D.   On:    Assessment & Plan:   Mindy Bright was seen today for hypothyroidism.  Diagnoses and all orders for this visit:  Paresthesias- She has vague symptoms that are not c/w GB syndrome. Her neuro exam is normal. I will check labs to evaluate for secondary and metabolic causes. If these are all normal and she has persistent s/s then will order an MRI of brain and C-spine to evaluate for demyelinating process. -     CBC with Differential/Platelet; Future -     Vitamin B12; Future -     Sedimentation rate; Future -     RPR; Future -     Folate; Future -     Hepatic function panel; Future -     B. burgdorfi antibodies by WB; Future -     B. burgdorfi antibodies by WB -     Hepatic function panel -     Folate -     RPR -     Sedimentation rate -     Vitamin B12 -     CBC with Differential/Platelet  Hyperglycemia- I will get an A1C to evaluate for DM2. -     BASIC METABOLIC PANEL WITH GFR; Future -     Hemoglobin A1c; Future -     Hepatic function panel;  Future -     Hepatic function panel -     Hemoglobin A1c -     BASIC METABOLIC PANEL WITH GFR  Acquired hypothyroidism- I will check her TSH and will change her dose if needed. -     CBC with Differential/Platelet; Future -     Hepatic function panel; Future -     TSH; Future -     TSH -     Hepatic function panel -     CBC with Differential/Platelet   I am having Malary C. Konkel maintain her famotidine and hyoscyamine.  No orders of the defined types were placed in this encounter.    Follow-up: Return in about 4 weeks (around 03/28/2020).  Sanda Linger, MD

## 2020-03-04 ENCOUNTER — Other Ambulatory Visit: Payer: Self-pay | Admitting: Internal Medicine

## 2020-03-06 ENCOUNTER — Telehealth: Payer: Self-pay | Admitting: Internal Medicine

## 2020-03-06 ENCOUNTER — Ambulatory Visit: Payer: BC Managed Care – PPO | Admitting: Internal Medicine

## 2020-03-06 NOTE — Telephone Encounter (Signed)
Patient requesting follow-up regarding labs done on 9.2.21, patient has been checking my chart for results but labs are not released.  Please contact the patient

## 2020-03-06 NOTE — Telephone Encounter (Signed)
Pt has been informed and expressed understanding.  

## 2020-03-08 NOTE — Telephone Encounter (Addendum)
° °  Patient calling again for lab results. Advised no result seen at this time  Patient requesting follow up call be made to lab to have results released.

## 2020-03-11 NOTE — Telephone Encounter (Signed)
  Follow up message  Patient calling again for lab results. Patient states she is still having symptoms

## 2020-03-12 ENCOUNTER — Other Ambulatory Visit: Payer: Self-pay | Admitting: Internal Medicine

## 2020-03-12 DIAGNOSIS — R202 Paresthesia of skin: Secondary | ICD-10-CM

## 2020-03-12 LAB — HEPATIC FUNCTION PANEL
AG Ratio: 1.5 (calc) (ref 1.0–2.5)
ALT: 15 U/L (ref 6–29)
AST: 17 U/L (ref 10–30)
Albumin: 4.3 g/dL (ref 3.6–5.1)
Alkaline phosphatase (APISO): 71 U/L (ref 31–125)
Bilirubin, Direct: 0.1 mg/dL (ref 0.0–0.2)
Globulin: 2.8 g/dL (calc) (ref 1.9–3.7)
Indirect Bilirubin: 0.5 mg/dL (calc) (ref 0.2–1.2)
Total Bilirubin: 0.6 mg/dL (ref 0.2–1.2)
Total Protein: 7.1 g/dL (ref 6.1–8.1)

## 2020-03-12 LAB — B. BURGDORFI ANTIBODIES BY WB
B burgdorferi IgG Abs (IB): NEGATIVE
B burgdorferi IgM Abs (IB): POSITIVE — AB
Lyme Disease 18 kD IgG: NONREACTIVE
Lyme Disease 23 kD IgG: NONREACTIVE
Lyme Disease 23 kD IgM: REACTIVE — AB
Lyme Disease 28 kD IgG: NONREACTIVE
Lyme Disease 30 kD IgG: NONREACTIVE
Lyme Disease 39 kD IgG: NONREACTIVE
Lyme Disease 39 kD IgM: REACTIVE — AB
Lyme Disease 41 kD IgG: NONREACTIVE
Lyme Disease 41 kD IgM: NONREACTIVE
Lyme Disease 45 kD IgG: NONREACTIVE
Lyme Disease 58 kD IgG: NONREACTIVE
Lyme Disease 66 kD IgG: NONREACTIVE
Lyme Disease 93 kD IgG: NONREACTIVE

## 2020-03-12 LAB — CBC WITH DIFFERENTIAL/PLATELET
Absolute Monocytes: 440 cells/uL (ref 200–950)
Basophils Absolute: 50 cells/uL (ref 0–200)
Basophils Relative: 0.6 %
Eosinophils Absolute: 141 cells/uL (ref 15–500)
Eosinophils Relative: 1.7 %
HCT: 38.5 % (ref 35.0–45.0)
Hemoglobin: 12.7 g/dL (ref 11.7–15.5)
Lymphs Abs: 2407 cells/uL (ref 850–3900)
MCH: 28.7 pg (ref 27.0–33.0)
MCHC: 33 g/dL (ref 32.0–36.0)
MCV: 86.9 fL (ref 80.0–100.0)
MPV: 11 fL (ref 7.5–12.5)
Monocytes Relative: 5.3 %
Neutro Abs: 5262 cells/uL (ref 1500–7800)
Neutrophils Relative %: 63.4 %
Platelets: 303 10*3/uL (ref 140–400)
RBC: 4.43 10*6/uL (ref 3.80–5.10)
RDW: 12.6 % (ref 11.0–15.0)
Total Lymphocyte: 29 %
WBC: 8.3 10*3/uL (ref 3.8–10.8)

## 2020-03-12 LAB — BASIC METABOLIC PANEL WITH GFR
BUN: 15 mg/dL (ref 7–25)
CO2: 25 mmol/L (ref 20–32)
Calcium: 9.2 mg/dL (ref 8.6–10.2)
Chloride: 106 mmol/L (ref 98–110)
Creat: 0.89 mg/dL (ref 0.50–1.10)
GFR, Est African American: 95 mL/min/{1.73_m2} (ref >=60)
GFR, Est Non African American: 82 mL/min/{1.73_m2} (ref >=60)
Glucose, Bld: 99 mg/dL (ref 65–99)
Potassium: 4.1 mmol/L (ref 3.5–5.3)
Sodium: 139 mmol/L (ref 135–146)

## 2020-03-12 LAB — HEMOGLOBIN A1C
Hgb A1c MFr Bld: 5.5 % of total Hgb (ref <5.7)
Mean Plasma Glucose: 111 (calc)
eAG (mmol/L): 6.2 (calc)

## 2020-03-12 LAB — FOLATE: Folate: 19.3 ng/mL

## 2020-03-12 LAB — RPR: RPR Ser Ql: NONREACTIVE

## 2020-03-12 LAB — VITAMIN B12: Vitamin B-12: 412 pg/mL (ref 200–1100)

## 2020-03-12 LAB — SEDIMENTATION RATE: Sed Rate: 2 mm/h (ref 0–20)

## 2020-03-12 LAB — TSH: TSH: 2.96 mIU/L

## 2020-03-14 ENCOUNTER — Other Ambulatory Visit: Payer: Self-pay | Admitting: Internal Medicine

## 2020-03-14 DIAGNOSIS — R202 Paresthesia of skin: Secondary | ICD-10-CM

## 2020-03-15 ENCOUNTER — Encounter: Payer: Self-pay | Admitting: Internal Medicine

## 2020-03-21 ENCOUNTER — Encounter: Payer: Self-pay | Admitting: Internal Medicine

## 2020-04-01 ENCOUNTER — Encounter: Payer: BC Managed Care – PPO | Admitting: Internal Medicine

## 2020-04-06 ENCOUNTER — Other Ambulatory Visit: Payer: Self-pay | Admitting: Internal Medicine

## 2020-04-08 ENCOUNTER — Encounter: Payer: Self-pay | Admitting: Internal Medicine

## 2020-04-12 ENCOUNTER — Other Ambulatory Visit: Payer: Self-pay | Admitting: Internal Medicine

## 2020-04-12 NOTE — Telephone Encounter (Signed)
   Patient calling to report pharmacy does not have levothyroxine (SYNTHROID) 50 MCG tablet.  Patient made aware [facility] is down at this time  Please resend to pharmacy when system available

## 2020-04-13 ENCOUNTER — Other Ambulatory Visit: Payer: Self-pay | Admitting: Internal Medicine

## 2020-04-15 MED ORDER — LEVOTHYROXINE SODIUM 50 MCG PO TABS: ORAL_TABLET | ORAL | 0 refills | Status: DC

## 2020-04-15 NOTE — Addendum Note (Signed)
Addended by: Deatra James on: 04/15/2020 12:32 PM   Modules accepted: Orders

## 2020-04-15 NOTE — Telephone Encounter (Signed)
   Patient states pharmacy gave her a 3 day supply of Levothyroxine. Please resend request to pharmacy

## 2020-04-25 NOTE — Patient Instructions (Addendum)
All other Health Maintenance issues reviewed.   All recommended immunizations and age-appropriate screenings are up-to-date or discussed.  No immunization administered today.   Medications reviewed and updated.  Changes include :   Omeprazole 20 mg daily  Your prescription(s) have been submitted to your pharmacy. Please take as directed and contact our office if you believe you are having problem(s) with the medication(s).    Please followup in 1 year    Health Maintenance, Female Adopting a healthy lifestyle and getting preventive care are important in promoting health and wellness. Ask your health care provider about:  The right schedule for you to have regular tests and exams.  Things you can do on your own to prevent diseases and keep yourself healthy. What should I know about diet, weight, and exercise? Eat a healthy diet   Eat a diet that includes plenty of vegetables, fruits, low-fat dairy products, and lean protein.  Do not eat a lot of foods that are high in solid fats, added sugars, or sodium. Maintain a healthy weight Body mass index (BMI) is used to identify weight problems. It estimates body fat based on height and weight. Your health care provider can help determine your BMI and help you achieve or maintain a healthy weight. Get regular exercise Get regular exercise. This is one of the most important things you can do for your health. Most adults should:  Exercise for at least 150 minutes each week. The exercise should increase your heart rate and make you sweat (moderate-intensity exercise).  Do strengthening exercises at least twice a week. This is in addition to the moderate-intensity exercise.  Spend less time sitting. Even light physical activity can be beneficial. Watch cholesterol and blood lipids Have your blood tested for lipids and cholesterol at 39 years of age, then have this test every 5 years. Have your cholesterol levels checked more often  if:  Your lipid or cholesterol levels are high.  You are older than 39 years of age.  You are at high risk for heart disease. What should I know about cancer screening? Depending on your health history and family history, you may need to have cancer screening at various ages. This may include screening for:  Breast cancer.  Cervical cancer.  Colorectal cancer.  Skin cancer.  Lung cancer. What should I know about heart disease, diabetes, and high blood pressure? Blood pressure and heart disease  High blood pressure causes heart disease and increases the risk of stroke. This is more likely to develop in people who have high blood pressure readings, are of African descent, or are overweight.  Have your blood pressure checked: ? Every 3-5 years if you are 86-67 years of age. ? Every year if you are 19 years old or older. Diabetes Have regular diabetes screenings. This checks your fasting blood sugar level. Have the screening done:  Once every three years after age 72 if you are at a normal weight and have a low risk for diabetes.  More often and at a younger age if you are overweight or have a high risk for diabetes. What should I know about preventing infection? Hepatitis B If you have a higher risk for hepatitis B, you should be screened for this virus. Talk with your health care provider to find out if you are at risk for hepatitis B infection. Hepatitis C Testing is recommended for:  Everyone born from [birth year] through 1965.  Anyone with known risk factors for hepatitis C. Sexually transmitted  infections (STIs)  Get screened for STIs, including gonorrhea and chlamydia, if: ? You are sexually active and are younger than 39 years of age. ? You are older than 39 years of age and your health care provider tells you that you are at risk for this type of infection. ? Your sexual activity has changed since you were last screened, and you are at increased risk for chlamydia or  gonorrhea. Ask your health care provider if you are at risk.  Ask your health care provider about whether you are at high risk for HIV. Your health care provider may recommend a prescription medicine to help prevent HIV infection. If you choose to take medicine to prevent HIV, you should first get tested for HIV. You should then be tested every 3 months for as long as you are taking the medicine. Pregnancy  If you are about to stop having your period (premenopausal) and you may become pregnant, seek counseling before you get pregnant.  Take 400 to 800 micrograms (mcg) of folic acid every day if you become pregnant.  Ask for birth control (contraception) if you want to prevent pregnancy. Osteoporosis and menopause Osteoporosis is a disease in which the bones lose minerals and strength with aging. This can result in bone fractures. If you are 75 years old or older, or if you are at risk for osteoporosis and fractures, ask your health care provider if you should:  Be screened for bone loss.  Take a calcium or vitamin D supplement to lower your risk of fractures.  Be given hormone replacement therapy (HRT) to treat symptoms of menopause. Follow these instructions at home: Lifestyle  Do not use any products that contain nicotine or tobacco, such as cigarettes, e-cigarettes, and chewing tobacco. If you need help quitting, ask your health care provider.  Do not use street drugs.  Do not share needles.  Ask your health care provider for help if you need support or information about quitting drugs. Alcohol use  Do not drink alcohol if: ? Your health care provider tells you not to drink. ? You are pregnant, may be pregnant, or are planning to become pregnant.  If you drink alcohol: ? Limit how much you use to 0-1 drink a day. ? Limit intake if you are breastfeeding.  Be aware of how much alcohol is in your drink. In the [place] one drink equals one 12 oz bottle of beer (355 mL), one 5 oz  glass of wine (148 mL), or one 1 oz glass of hard liquor (44 mL). General instructions  Schedule regular health, dental, and eye exams.  Stay current with your vaccines.  Tell your health care provider if: ? You often feel depressed. ? You have ever been abused or do not feel safe at home. Summary  Adopting a healthy lifestyle and getting preventive care are important in promoting health and wellness.  Follow your health care provider's instructions about healthy diet, exercising, and getting tested or screened for diseases.  Follow your health care provider's instructions on monitoring your cholesterol and blood pressure. This information is not intended to replace advice given to you by your health care provider. Make sure you discuss any questions you have with your health care provider. Document Revised: 06/08/2018 Document Reviewed: 06/08/2018 Elsevier Patient Education  2020 [employer].

## 2020-04-25 NOTE — Progress Notes (Signed)
Subjective:    Patient ID: Mindy Bright, female    DOB: [birth year], 39 y.o.   MRN: [id number]   This visit occurred during the SARS-CoV-2 public health emergency.  Safety protocols were in place, including screening questions prior to the visit, additional usage of staff PPE, and extensive cleaning of exam room while observing appropriate contact time as indicated for disinfecting solutions.    HPI She is here for a physical exam.   She had an episode of tingling in different areas of her body and was seen by another physician in the office.  The test came out normal.  Looking back she does feel this was related to anxiety.  She is not anxious at that time, but the week before had significant anxiety.  She has not had any tingling since then.  She has no other concerns.  Medications and allergies reviewed with patient and updated if appropriate.  Patient Active Problem List   Diagnosis Date Noted  . Paresthesias 02/29/2020  . RUQ abdominal pain 09/27/2019  . Encounter for cervical Pap smear with pelvic exam 03/29/2019  . Family history of diabetes mellitus (DM) 01/05/2019  . Hyperglycemia 01/04/2019  . Cholelithiasis 12/29/2016  . History of abnormal cervical Pap smear 12/25/2016  . Hypothyroidism 09/06/2016  . GERD (gastroesophageal reflux disease) 11/09/2014    Current Outpatient Medications on File Prior to Visit  Medication Sig Dispense Refill  . famotidine (PEPCID) 40 MG tablet Take 1 tablet (40 mg total) by mouth 2 (two) times daily. 180 tablet 3  . levothyroxine (SYNTHROID) 50 MCG tablet TAKE 1 TABLET BY MOUTH EVERY DAY BEFORE BREAKFAST(KEEP APPT FOR FUTURE REFILLS) 30 tablet 0   No current facility-administered medications on file prior to visit.    Past Medical History:  Diagnosis Date  . Abnormal Pap smear   . Anemia   . Anxiety   . Gall bladder disease   . GERD (gastroesophageal reflux disease)   . H/O hiatal hernia   . Irregular periods 04/30/2014  .  Patient desires pregnancy 12/11/2013  . Pregnant 10/26/2014  . Reflux 11/09/2014  . Thyroid disease   . Unspecified symptom associated with female genital organs 12/11/2013  . Vaginal discharge 12/11/2013  . Vaginal Pap smear, abnormal    >10 years ago, no treatment    Past Surgical History:  Procedure Laterality Date  . COLONOSCOPY  07/2019  . UPPER GI ENDOSCOPY  07/2019  . WISDOM TOOTH EXTRACTION      Social History   Socioeconomic History  . Marital status: Married    Spouse name: Not on file  . Number of children: 2  . Years of education: Not on file  . Highest education level: Not on file  Occupational History  . Occupation: PE Runner, broadcasting/film/video in [place]  Tobacco Use  . Smoking status: Former Smoker    Packs/day: 0.25    Years: 3.00    Pack years: 0.75    Types: Cigarettes    Quit date: 07/14/2002    Years since quitting: 17.7  . Smokeless tobacco: Never Used  Vaping Use  . Vaping Use: Never used  Substance and Sexual Activity  . Alcohol use: No    Comment: not now  . Drug use: No  . Sexual activity: Yes    Birth control/protection: None, Surgical    Comment: vasectomy   Other Topics Concern  . Not on file  Social History Narrative  . Not on file   Social Determinants of Health  Financial Resource Strain:   . Difficulty of Paying Living Expenses: Not on file  Food Insecurity:   . Worried About Programme researcher, broadcasting/film/video in the Last Year: Not on file  . Ran Out of Food in the Last Year: Not on file  Transportation Needs:   . Lack of Transportation (Medical): Not on file  . Lack of Transportation (Non-Medical): Not on file  Physical Activity:   . Days of Exercise per Week: Not on file  . Minutes of Exercise per Session: Not on file  Stress:   . Feeling of Stress : Not on file  Social Connections:   . Frequency of Communication with Friends and Family: Not on file  . Frequency of Social Gatherings with Friends and Family: Not on file  . Attends Religious Services: Not on  file  . Active Member of Clubs or Organizations: Not on file  . Attends Banker Meetings: Not on file  . Marital Status: Not on file    Family History  Problem Relation Age of Onset  . Diabetes Mother   . Heart disease Paternal Grandfather   . Diabetes Paternal Grandfather   . Diabetes Maternal Grandmother   . Hypertension Father   . Heart disease Maternal Grandfather   . Gout Brother   . Diabetes Maternal Aunt   . Colon cancer Neg Hx   . Stomach cancer Neg Hx   . Pancreatic cancer Neg Hx   . Esophageal cancer Neg Hx   . Liver disease Neg Hx   . Rectal cancer Neg Hx     Review of Systems  Constitutional: Negative for chills and fever.  Eyes: Negative for visual disturbance.  Respiratory: Negative for cough, shortness of breath and wheezing.   Cardiovascular: Negative for chest pain, palpitations and leg swelling.  Gastrointestinal: Negative for abdominal pain, blood in stool, constipation, diarrhea and nausea.  Genitourinary: Negative for dysuria and hematuria.  Musculoskeletal: Negative for arthralgias and back pain.  Skin: Negative for rash.  Neurological: Positive for headaches (sinus heaches). Negative for dizziness and light-headedness.  Psychiatric/Behavioral: Negative for dysphoric mood. The patient is not nervous/anxious.        Objective:   Vitals:   04/26/20 1523  BP: 102/68  Pulse: 73  Temp: 98.2 F (36.8 C)  SpO2: 99%   Filed Weights   04/26/20 1523  Weight: 121 lb (54.9 kg)   Body mass index is 22.13 kg/m.  BP Readings from Last 3 Encounters:  04/26/20 102/68  02/29/20 124/78  09/27/19 108/64    Wt Readings from Last 3 Encounters:  04/26/20 121 lb (54.9 kg)  02/29/20 122 lb (55.3 kg)  09/27/19 116 lb (52.6 kg)     Physical Exam Constitutional: She appears well-developed and well-nourished. No distress.  HENT:  Head: Normocephalic and atraumatic.  Right Ear: External ear normal. Normal ear canal and TM Left Ear: External  ear normal.  Normal ear canal and TM Mouth/Throat: Oropharynx is clear and moist.  Eyes: Conjunctivae and EOM are normal.  Neck: Neck supple. No tracheal deviation present. No thyromegaly present.  No carotid bruit  Cardiovascular: Normal rate, regular rhythm and normal heart sounds.   No murmur heard.  No edema. Pulmonary/Chest: Effort normal and breath sounds normal. No respiratory distress. She has no wheezes. She has no rales.  Breast: deferred   Abdominal: Soft. She exhibits no distension. There is no tenderness.  Lymphadenopathy: She has no cervical adenopathy.  Skin: Skin is warm and dry. She is  not diaphoretic.  Psychiatric: She has a normal mood and affect. Her behavior is normal.        Assessment & Plan:   Physical exam: Screening blood work    Reviewed recent labs Immunizations    Has not had Covid - discussed importance of covid vaccine, tdap up to date, deferred flu Gyn    Up to date  - will schedule Exercise  PE teacher - active Weight  Normal  Substance abuse  none See derm Q 6 months     See Problem List for Assessment and Plan of chronic medical problems.

## 2020-04-26 ENCOUNTER — Other Ambulatory Visit: Payer: Self-pay

## 2020-04-26 ENCOUNTER — Ambulatory Visit (INDEPENDENT_AMBULATORY_CARE_PROVIDER_SITE_OTHER): Payer: BC Managed Care – PPO | Admitting: Internal Medicine

## 2020-04-26 ENCOUNTER — Encounter: Payer: Self-pay | Admitting: Internal Medicine

## 2020-04-26 VITALS — BP 102/68 | HR 73 | Temp 98.2°F | Ht 62.0 in | Wt 121.0 lb

## 2020-04-26 DIAGNOSIS — E039 Hypothyroidism, unspecified: Secondary | ICD-10-CM | POA: Diagnosis not present

## 2020-04-26 DIAGNOSIS — Z0000 Encounter for general adult medical examination without abnormal findings: Secondary | ICD-10-CM | POA: Diagnosis not present

## 2020-04-26 DIAGNOSIS — K219 Gastro-esophageal reflux disease without esophagitis: Secondary | ICD-10-CM

## 2020-04-26 DIAGNOSIS — R202 Paresthesia of skin: Secondary | ICD-10-CM

## 2020-04-26 DIAGNOSIS — R739 Hyperglycemia, unspecified: Secondary | ICD-10-CM | POA: Diagnosis not present

## 2020-04-26 DIAGNOSIS — K828 Other specified diseases of gallbladder: Secondary | ICD-10-CM | POA: Insufficient documentation

## 2020-04-26 MED ORDER — FAMOTIDINE 20 MG PO TABS: 20.0000 mg | ORAL_TABLET | Freq: Two times a day (BID) | ORAL | 3 refills | Status: DC

## 2020-04-26 MED ORDER — LEVOTHYROXINE SODIUM 50 MCG PO TABS
ORAL_TABLET | ORAL | 3 refills | Status: DC
Start: 2020-04-26 — End: 2021-06-05

## 2020-04-26 NOTE — Assessment & Plan Note (Signed)
Chronic Has no stones Has seen surgery - deferred surgery for now Eats low fat diet No symptoms x several months

## 2020-04-26 NOTE — Assessment & Plan Note (Signed)
Chronic  Clinically euthyroid Currently taking levothyroxine 50 mcg Check tsh  Titrate med dose if needed  

## 2020-04-26 NOTE — Assessment & Plan Note (Signed)
Chronic a1c 

## 2020-04-26 NOTE — Assessment & Plan Note (Signed)
Resolved  Likely related to anxiety

## 2020-04-26 NOTE — Assessment & Plan Note (Signed)
Chronic GERD controlled Continue omeprazole but will try 20 mg daily

## 2020-06-04 ENCOUNTER — Telehealth: Payer: Self-pay | Admitting: Internal Medicine

## 2020-06-04 NOTE — Telephone Encounter (Signed)
   Patient would like to know if COVID antibody testing would be appropriate for her to have.  She was positive for COVID in 2020. In November she was around a person that was positive.  She has not done a recent Covid test  Please advise

## 2020-06-04 NOTE — Telephone Encounter (Signed)
Spoke with patient and info given 

## 2020-06-04 NOTE — Telephone Encounter (Signed)
If she is not currently symptomatic there is no need for her to get the test.  If she is symptomatic it will tell her if she has active Covid.  If she is looking to see if she has immunity these tests are not reliable for that and its not recommended.

## 2020-07-17 ENCOUNTER — Other Ambulatory Visit (HOSPITAL_COMMUNITY)
Admission: RE | Admit: 2020-07-17 | Discharge: 2020-07-17 | Disposition: A | Discharge status: Home / Self Care | Payer: BC Managed Care – PPO | Source: Ambulatory Visit | Attending: Adult Health | Admitting: Adult Health

## 2020-07-17 ENCOUNTER — Ambulatory Visit (INDEPENDENT_AMBULATORY_CARE_PROVIDER_SITE_OTHER): Payer: BC Managed Care – PPO | Admitting: Adult Health

## 2020-07-17 ENCOUNTER — Encounter: Payer: Self-pay | Admitting: Adult Health

## 2020-07-17 ENCOUNTER — Other Ambulatory Visit: Payer: Self-pay

## 2020-07-17 VITALS — BP 113/69 | HR 72 | Ht 61.5 in | Wt 124.0 lb

## 2020-07-17 DIAGNOSIS — Z01419 Encounter for gynecological examination (general) (routine) without abnormal findings: Secondary | ICD-10-CM | POA: Diagnosis not present

## 2020-07-17 NOTE — Progress Notes (Signed)
Patient ID: Mindy Bright, female   DOB: [birth year], 40 y.o.   MRN: [id number] History of Present Illness:  Mindy Bright is a 40 year old white female, married, G2P2 in for a well woman gyn exam and pap(her request,she had abnormal years ago). 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  PCP is Dr Lawerance Bach  Current Medications, Allergies, Past Medical History, Past Surgical History, Family History and Social History were reviewed in [facility] electronic medical record.     Review of Systems: Patient denies any headaches, hearing loss, fatigue, blurred vision, shortness of breath, chest pain, abdominal pain, problems with bowel movements, urination, or intercourse. No joint pain or mood swings.    Physical Exam:BP 113/69 (BP Location: Left Arm, Patient Position: Sitting, Cuff Size: Normal)   Pulse 72   Ht 5' 1.5" (1.562 m)   Wt 124 lb (56.2 kg)   LMP 06/19/2020   Breastfeeding No   BMI 23.05 kg/m  General:  Well developed, well nourished, no acute distress Skin:  Warm and dry Neck:  Midline trachea, normal thyroid, good ROM, no lymphadenopathy Lungs; Clear to auscultation bilaterally Breast:  No dominant palpable mass, retraction, or nipple discharge Cardiovascular: Regular rate and rhythm Abdomen:  Soft, non tender, no hepatosplenomegaly Pelvic:  External genitalia is normal in appearance, no lesions.  The vagina is normal in appearance. Urethra has no lesions or masses. The cervix is bulbous,friable with EC brush,pap with HRHPV genotyping performed.  Uterus is felt to be normal size, shape, and contour.  No adnexal masses or tenderness noted.Bladder is non tender, no masses felt. Extremities/musculoskeletal:  No swelling or varicosities noted, no clubbing or cyanosis Psych:  No mood changes, alert and cooperative,seems happy AA is 0 Fall risk is low PHQ 9 score is 0 GAD 7 score is 0  Upstream - 07/17/20 1557      Pregnancy Intention Screening   Does the patient want to become  pregnant in the next year? No    Does the patient's partner want to become pregnant in the next year? No    Would the patient like to discuss contraceptive options today? No      Contraception Wrap Up   Current Method Vasectomy    End Method Vasectomy    Contraception Counseling Provided No         Examination chaperoned by Malachy Mood LPN.   Impression and Plan: 1. Encounter for gynecological examination with Papanicolaou smear of cervix Pap sent Physical in 1 year  Pap in 1-3 if normal Mammogram at 40 Labs with PCP

## 2020-07-22 LAB — CYTOLOGY - PAP
Comment: NEGATIVE
Diagnosis: NEGATIVE
High risk HPV: NEGATIVE

## 2020-10-02 ENCOUNTER — Ambulatory Visit: Payer: BC Managed Care – PPO | Admitting: Physician Assistant

## 2020-11-06 ENCOUNTER — Ambulatory Visit: Payer: BC Managed Care – PPO | Admitting: Internal Medicine

## 2020-12-05 ENCOUNTER — Other Ambulatory Visit: Payer: Self-pay

## 2020-12-05 ENCOUNTER — Ambulatory Visit: Payer: BC Managed Care – PPO | Admitting: Physician Assistant

## 2020-12-05 ENCOUNTER — Encounter: Payer: Self-pay | Admitting: Physician Assistant

## 2020-12-05 DIAGNOSIS — Z1283 Encounter for screening for malignant neoplasm of skin: Secondary | ICD-10-CM

## 2020-12-05 DIAGNOSIS — L821 Other seborrheic keratosis: Secondary | ICD-10-CM | POA: Diagnosis not present

## 2020-12-05 DIAGNOSIS — Z86018 Personal history of other benign neoplasm: Secondary | ICD-10-CM | POA: Diagnosis not present

## 2020-12-05 DIAGNOSIS — L818 Other specified disorders of pigmentation: Secondary | ICD-10-CM

## 2020-12-05 DIAGNOSIS — Z808 Family history of malignant neoplasm of other organs or systems: Secondary | ICD-10-CM

## 2020-12-05 MED ORDER — TRETINOIN 0.05 % EX CREA: TOPICAL_CREAM | Freq: Every day | CUTANEOUS | 0 refills | Status: AC

## 2020-12-05 NOTE — Progress Notes (Signed)
   Follow-Up Visit   Subjective  Mindy Bright is a 40 y.o. female who presents for the following: Annual Exam (Patient here today for yearly skin check, no concerns. Patient does have a personal history of atypical moles. No personal history of melanoma or non mole skin cancer. Per patient her dad has a history of non mole skin cancer. No family history of atypical moles, or melanoma. ).   The following portions of the chart were reviewed this encounter and updated as appropriate: Allergies  Meds  Problems  Med Hx  Surg Hx  Fam Hx       Objective  Well appearing patient in no apparent distress; mood and affect are within normal limits.  A full examination was performed including scalp, head, eyes, ears, nose, lips, neck, chest, axillae, abdomen, back, buttocks, bilateral upper extremities, bilateral lower extremities, hands, feet, fingers, toes, fingernails, and toenails. All findings within normal limits unless otherwise noted below.  head to toe No atypical nevi No signs of non-mole skin cancer.   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 - Observe - Call for any changes      I, Ceniyah Thorp, PA-C, have reviewed all documentation's for this visit.  The documentation on 12/05/20 for the exam, diagnosis, procedures and orders are all accurate and complete.

## 2021-01-24 DIAGNOSIS — M25571 Pain in right ankle and joints of right foot: Secondary | ICD-10-CM | POA: Diagnosis not present

## 2021-01-27 ENCOUNTER — Ambulatory Visit: Payer: BC Managed Care – PPO | Admitting: Internal Medicine

## 2021-01-28 ENCOUNTER — Ambulatory Visit: Payer: BC Managed Care – PPO | Admitting: Internal Medicine

## 2021-03-18 NOTE — Progress Notes (Signed)
Subjective:    Patient ID: Mindy Bright, female    DOB: 02-20-1981, 40 y.o.   MRN: [id number]  This visit occurred during the SARS-CoV-2 public health emergency.  Safety protocols were in place, including screening questions prior to the visit, additional usage of staff PPE, and extensive cleaning of exam room while observing appropriate contact time as indicated for disinfecting solutions.    HPI The patient is here for an acute visit.  Last week sinus/cold symptoms   Eye twitching for one week - it started last Monday - that weekend before she had pressure and twitching in her right eye.  She thought it was sinus related.  The sinus issues cleared up, but the right eye is still twitching.   She noticed random muscle twitching throughout her body as well.  Yesterday her left eye was twitching.  It does not last long.    No blurry vision,  no HA's.     Took allegra for 3 days -no other meds.  A lot going on at work.  Stress level is high.    Two days of lower abd pressure.  Freq urination.   Last few days eating and drinking regularly.     She thinks it may be all stress - last year had tingling and it was related to stress-it was about this time of year-just after school started.  She just needed to come and get evaluated.  Medications and allergies reviewed with patient and updated if appropriate.  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   Paresthesias 02/29/2020   RUQ abdominal pain 09/27/2019   Encounter for cervical Pap smear with pelvic exam 03/29/2019   Family history of diabetes mellitus (DM) 01/05/2019   Hyperglycemia 01/04/2019   History of abnormal cervical Pap smear 12/25/2016   Encounter for gynecological examination with Papanicolaou smear of cervix 12/25/2016   Hypothyroidism 09/06/2016   GERD (gastroesophageal reflux disease) 11/09/2014    Current Outpatient Medications on File Prior to Visit   Medication Sig Dispense Refill   famotidine (PEPCID) 20 MG tablet Take 1 tablet (20 mg total) by mouth 2 (two) times daily. 180 tablet 3   ibuprofen (ADVIL) 600 MG tablet Take 600 mg by mouth every 6 (six) hours as needed.     levothyroxine (SYNTHROID) 50 MCG tablet TAKE 1 TABLET BY MOUTH EVERY DAY BEFORE BREAKFAST 90 tablet 3   tretinoin (RETIN-A) 0.05 % cream Apply topically at bedtime. 45 g 0   No current facility-administered medications on file prior to visit.    Past Medical History:  Diagnosis Date   Abnormal Pap smear    Anemia    Anxiety    Gall bladder disease    GERD (gastroesophageal reflux disease)    H/O hiatal hernia    Irregular periods 04/30/2014   Patient desires pregnancy 12/11/2013   Pregnant 10/26/2014   Reflux 11/09/2014   Thyroid disease    Unspecified symptom associated with female genital organs 12/11/2013   Vaginal discharge 12/11/2013   Vaginal Pap smear, abnormal    >10 years ago, no treatment    Past Surgical History:  Procedure Laterality Date   COLONOSCOPY  07/2019   UPPER GI ENDOSCOPY  07/2019   WISDOM TOOTH EXTRACTION      Social History   Socioeconomic History   Marital status: Married    Spouse name: Not on file   Number of children: 2   Years of  education: Not on file   Highest education level: Not on file  Occupational History   Occupation: PE teacher in [place]  Tobacco Use   Smoking status: Former    Packs/day: 0.25    Years: 3.00    Pack years: 0.75    Types: Cigarettes    Quit date: 07/14/2002    Years since quitting: 18.6   Smokeless tobacco: Never  Vaping Use   Vaping Use: Never used  Substance and Sexual Activity   Alcohol use: No    Comment: not now   Drug use: No   Sexual activity: Yes    Birth control/protection: Surgical    Comment: vasectomy   Other Topics Concern   Not on file  Social History Narrative   Not on file   Social Determinants of Health   Financial Resource Strain: Low Risk    Difficulty of  Paying Living Expenses: Not hard at all  Food Insecurity: No Food Insecurity   Worried About Programme researcher, broadcasting/film/video in the Last Year: Never true   Ran Out of Food in the Last Year: Never true  Transportation Needs: No Transportation Needs   Lack of Transportation (Medical): No   Lack of Transportation (Non-Medical): No  Physical Activity: Sufficiently Active   Days of Exercise per Week: 5 days   Minutes of Exercise per Session: 30 min  Stress: No Stress Concern Present   Feeling of Stress : Not at all  Social Connections: Socially Integrated   Frequency of Communication with Friends and Family: More than three times a week   Frequency of Social Gatherings with Friends and Family: Three times a week   Attends Religious Services: 1 to 4 times per year   Active Member of Clubs or Organizations: Yes   Attends Banker Meetings: 1 to 4 times per year   Marital Status: Married    Family History  Problem Relation Age of Onset   Diabetes Mother    Heart disease Paternal Grandfather    Diabetes Paternal Grandfather    Diabetes Maternal Grandmother    Hypertension Father    Heart disease Maternal Grandfather    Gout Brother    Diabetes Maternal Aunt    Colon cancer Neg Hx    Stomach cancer Neg Hx    Pancreatic cancer Neg Hx    Esophageal cancer Neg Hx    Liver disease Neg Hx    Rectal cancer Neg Hx     Review of Systems  Constitutional:  Positive for fever (one week ago - one day ( kids had same thing)).  HENT:  Positive for postnasal drip (improving), sinus pressure (last week) and voice change (improving). Negative for congestion.   Eyes:  Negative for visual disturbance.  Respiratory:  Positive for cough (last week from PND). Negative for shortness of breath and wheezing.   Cardiovascular:  Negative for chest pain, palpitations and leg swelling.  Genitourinary:  Positive for frequency. Negative for dysuria and hematuria.       Bladder pressure  Neurological:  Negative  for dizziness, weakness, light-headedness, numbness and headaches.      Objective:   Vitals:   03/19/21 1540  BP: 112/76  Pulse: 79  Temp: 98 F (36.7 C)  SpO2: 99%   BP Readings from Last 3 Encounters:  03/19/21 112/76  07/17/20 113/69  04/26/20 102/68   Wt Readings from Last 3 Encounters:  03/19/21 120 lb (54.4 kg)  07/17/20 124 lb (56.2 kg)  04/26/20 121  lb (54.9 kg)   Body mass index is 22.31 kg/m.   Physical Exam Constitutional:      General: She is not in acute distress.    Appearance: Normal appearance. She is not ill-appearing.  HENT:     Head: Normocephalic and atraumatic.  Eyes:     Extraocular Movements: Extraocular movements intact.     Conjunctiva/sclera: Conjunctivae normal.  Cardiovascular:     Rate and Rhythm: Normal rate and regular rhythm.     Heart sounds: No murmur heard. Pulmonary:     Effort: Pulmonary effort is normal. No respiratory distress.     Breath sounds: No wheezing or rales.  Abdominal:     General: There is no distension.     Palpations: Abdomen is soft.     Tenderness: There is no abdominal tenderness. There is no guarding or rebound.  Musculoskeletal:     Cervical back: Neck supple. No tenderness.     Right lower leg: No edema.     Left lower leg: No edema.  Skin:    General: Skin is warm and dry.  Neurological:     Mental Status: She is alert and oriented to person, place, and time.     Cranial Nerves: No cranial nerve deficit.     Sensory: No sensory deficit.     Motor: No weakness.     Gait: Gait normal.     Comments: No visible muscle twitching  Psychiatric:     Comments: Anxious mood and affect           Assessment & Plan:    See Problem List for Assessment and Plan of chronic medical problems.

## 2021-03-19 ENCOUNTER — Ambulatory Visit: Payer: BC Managed Care – PPO | Admitting: Internal Medicine

## 2021-03-19 ENCOUNTER — Encounter: Payer: Self-pay | Admitting: Internal Medicine

## 2021-03-19 ENCOUNTER — Other Ambulatory Visit: Payer: Self-pay

## 2021-03-19 VITALS — BP 112/76 | HR 79 | Temp 98.0°F | Ht 61.5 in | Wt 120.0 lb

## 2021-03-19 DIAGNOSIS — E039 Hypothyroidism, unspecified: Secondary | ICD-10-CM

## 2021-03-19 DIAGNOSIS — R350 Frequency of micturition: Secondary | ICD-10-CM | POA: Diagnosis not present

## 2021-03-19 DIAGNOSIS — R253 Fasciculation: Secondary | ICD-10-CM | POA: Diagnosis not present

## 2021-03-19 DIAGNOSIS — Z833 Family history of diabetes mellitus: Secondary | ICD-10-CM

## 2021-03-19 DIAGNOSIS — K219 Gastro-esophageal reflux disease without esophagitis: Secondary | ICD-10-CM

## 2021-03-19 NOTE — Patient Instructions (Addendum)
  Blood work was ordered.     Medications changes include :   none     

## 2021-03-19 NOTE — Assessment & Plan Note (Signed)
Chronic Family history of diabetes Discussed that I do not think any of her symptoms are related to sugar Will check A1c

## 2021-03-19 NOTE — Assessment & Plan Note (Signed)
Chronic GERD controlled Continue Pepcid 20 mg 1-2 times daily

## 2021-03-19 NOTE — Assessment & Plan Note (Signed)
Chronic  Clinically euthyroid Currently taking levothyroxine 50 mcg daily Check tsh  Titrate med dose if needed  

## 2021-03-19 NOTE — Assessment & Plan Note (Signed)
Acute Started about a week ago with her right eye Yesterday also felt it in her left eye and has had some random twitching throughout the muscles in her body Likely related to increased stress No concerning symptoms with balance, strength, numbness or tingling-neuro exam is normal She is drinking and eating normally Will check CBC, CMP, magnesium Reassured Discussed stress management-deferred any medication at this time Will follow-up if her symptoms do not improve

## 2021-03-19 NOTE — Assessment & Plan Note (Signed)
Acute Associated with some lower abdominal pressure No abdominal tenderness on exam Rule out UTI Urinalysis, urine culture

## 2021-03-20 LAB — COMPREHENSIVE METABOLIC PANEL
ALT: 13 U/L (ref 0–35)
AST: 17 U/L (ref 0–37)
Albumin: 4.2 g/dL (ref 3.5–5.2)
Alkaline Phosphatase: 84 U/L (ref 39–117)
BUN: 18 mg/dL (ref 6–23)
CO2: 28 mEq/L (ref 19–32)
Calcium: 10 mg/dL (ref 8.4–10.5)
Chloride: 101 mEq/L (ref 96–112)
Creatinine, Ser: 0.87 mg/dL (ref 0.40–1.20)
GFR: 83.42 mL/min (ref >60.00)
Glucose, Bld: 86 mg/dL (ref 70–99)
Potassium: 3.9 mEq/L (ref 3.5–5.1)
Sodium: 137 mEq/L (ref 135–145)
Total Bilirubin: 0.4 mg/dL (ref 0.2–1.2)
Total Protein: 7.5 g/dL (ref 6.0–8.3)

## 2021-03-20 LAB — URINE CULTURE

## 2021-03-20 LAB — CBC WITH DIFFERENTIAL/PLATELET
Basophils Absolute: 0.1 10*3/uL (ref 0.0–0.1)
Basophils Relative: 0.5 % (ref 0.0–3.0)
Eosinophils Absolute: 0.3 10*3/uL (ref 0.0–0.7)
Eosinophils Relative: 2.8 % (ref 0.0–5.0)
HCT: 37.6 % (ref 36.0–46.0)
Hemoglobin: 12.4 g/dL (ref 12.0–15.0)
Lymphocytes Relative: 25.6 % (ref 12.0–46.0)
Lymphs Abs: 3 10*3/uL (ref 0.7–4.0)
MCHC: 33 g/dL (ref 30.0–36.0)
MCV: 85.1 fl (ref 78.0–100.0)
Monocytes Absolute: 0.6 10*3/uL (ref 0.1–1.0)
Monocytes Relative: 5.5 % (ref 3.0–12.0)
Neutro Abs: 7.6 10*3/uL (ref 1.4–7.7)
Neutrophils Relative %: 65.6 % (ref 43.0–77.0)
Platelets: 343 10*3/uL (ref 150.0–400.0)
RBC: 4.42 Mil/uL (ref 3.87–5.11)
RDW: 12.9 % (ref 11.5–15.5)
WBC: 11.5 10*3/uL — ABNORMAL HIGH (ref 4.0–10.5)

## 2021-03-20 LAB — URINALYSIS, ROUTINE W REFLEX MICROSCOPIC
Bilirubin Urine: NEGATIVE
Hgb urine dipstick: NEGATIVE
Ketones, ur: NEGATIVE
Leukocytes,Ua: NEGATIVE
Nitrite: NEGATIVE
RBC / HPF: NONE SEEN (ref 0–?)
Specific Gravity, Urine: 1.015 (ref 1.000–1.030)
Total Protein, Urine: NEGATIVE
Urine Glucose: NEGATIVE
Urobilinogen, UA: 0.2 (ref 0.0–1.0)
pH: 6 (ref 5.0–8.0)

## 2021-03-20 LAB — HEMOGLOBIN A1C: Hgb A1c MFr Bld: 5.8 % (ref 4.6–6.5)

## 2021-03-20 LAB — TSH: TSH: 4.04 u[IU]/mL (ref 0.35–5.50)

## 2021-03-20 LAB — MAGNESIUM: Magnesium: 2 mg/dL (ref 1.5–2.5)

## 2021-03-21 ENCOUNTER — Encounter: Payer: Self-pay | Admitting: Internal Medicine

## 2021-03-21 MED ORDER — ALPRAZOLAM 0.25 MG PO TABS: 0.2500 mg | ORAL_TABLET | Freq: Every day | ORAL | 0 refills | Status: DC | PRN

## 2021-03-21 MED ORDER — NITROFURANTOIN MONOHYD MACRO 100 MG PO CAPS: 100.0000 mg | ORAL_CAPSULE | Freq: Two times a day (BID) | ORAL | 0 refills | Status: DC

## 2021-03-21 NOTE — Addendum Note (Signed)
Addended by: Pincus Sanes on: 03/21/2021 07:48 AM   Modules accepted: Orders

## 2021-04-15 ENCOUNTER — Encounter: Payer: Self-pay | Admitting: Internal Medicine

## 2021-04-30 ENCOUNTER — Other Ambulatory Visit: Payer: Self-pay | Admitting: Internal Medicine

## 2021-05-14 ENCOUNTER — Ambulatory Visit: Payer: BC Managed Care – PPO

## 2021-05-14 ENCOUNTER — Ambulatory Visit
Admission: EM | Admit: 2021-05-14 | Discharge: 2021-05-14 | Disposition: A | Discharge status: Home / Self Care | Payer: BC Managed Care – PPO | Attending: Family Medicine | Admitting: Family Medicine

## 2021-05-14 ENCOUNTER — Other Ambulatory Visit: Payer: Self-pay

## 2021-05-14 ENCOUNTER — Encounter: Payer: Self-pay | Admitting: Emergency Medicine

## 2021-05-14 DIAGNOSIS — N898 Other specified noninflammatory disorders of vagina: Secondary | ICD-10-CM | POA: Insufficient documentation

## 2021-05-14 MED ORDER — DOXYCYCLINE HYCLATE 100 MG PO CAPS: 100.0000 mg | ORAL_CAPSULE | Freq: Two times a day (BID) | ORAL | 0 refills | Status: DC

## 2021-05-14 MED ORDER — MUPIROCIN 2 % EX OINT: 1.0000 "application " | TOPICAL_OINTMENT | Freq: Two times a day (BID) | CUTANEOUS | 0 refills | Status: DC

## 2021-05-14 NOTE — ED Triage Notes (Signed)
PT reports vaginal irritation / bumps that started Friday.   Started as 2 sites and became more plentiful over the weekend.   Area is itchy, burning, and painful.

## 2021-05-14 NOTE — Discharge Instructions (Addendum)
You may try using Dermaplast for the discomfort in addition to ibuprofen.

## 2021-05-14 NOTE — ED Provider Notes (Signed)
[facility]   [id number] 05/14/21 Arrival Time: 1742  ASSESSMENT & PLAN:  1. Vaginal irritation   2. Vaginal lesion    Possible HSV but lower suspicion given history. Discussed. Will treat as bacterial folliculitis first as HSV type/culture pending. Begin: Meds ordered this encounter  Medications   doxycycline (VIBRAMYCIN) 100 MG capsule    Sig: Take 1 capsule (100 mg total) by mouth 2 (two) times daily.    Dispense:  14 capsule    Refill:  0   mupirocin ointment (BACTROBAN) 2 %    Sig: Apply 1 application topically 2 (two) times daily.    Dispense:  22 g    Refill:  0      Discharge Instructions      You may try using Dermaplast for the discomfort in addition to ibuprofen.    Recommend:  Follow-up Information     Burns, Bobette Mo, MD.   Specialty: Internal Medicine Why: As needed. Contact information: [address] [place] [address] [phone]                  Reviewed expectations re: course of current medical issues. Questions answered. Outlined signs and symptoms indicating need for more acute intervention. Patient verbalized understanding. After Visit Summary given.   SUBJECTIVE:  Mindy Bright is a 40 y.o. female who presents with complaint of vaginal irritation; "painful bumps"; first noted approx 5 day ago, 3 days after shaving area. Slight progression with new areas; some discomfort and itching. No prodromal burning/tingling. No h/o genital HSV. Married/monogamous relationship x 10 years; no suspicion of marital infidelity. Without vaginal discharge. Normal urinary habits.  Patient's last menstrual period was 04/30/2021.   OBJECTIVE:  Vitals:   05/14/21 1814  BP: 129/81  Pulse: 75  Resp: 16  Temp: 97.8 F (36.6 C)  TempSrc: Oral  SpO2: 98%     General appearance: alert, cooperative, appears stated age and no distress GU: crops of inflamed indurations over R upper external vagina; mild TTP; upon  un-roofing a couple of these noted slight purulent drainage; no bleeding Psychological: alert and cooperative; normal mood and affect.   Labs Reviewed  HSV CULTURE AND TYPING    Allergies  Allergen Reactions   Penicillins Swelling    Has patient had a PCN reaction causing immediate rash, facial/tongue/throat swelling, SOB or lightheadedness with hypotension: Yes Has patient had a PCN reaction causing severe rash involving mucus membranes or skin necrosis: No Has patient had a PCN reaction that required hospitalization Yes Has patient had a PCN reaction occurring within the last 10 years: No If all of the above answers are "NO", then may proceed with Cephalosporin use.    Past Medical History:  Diagnosis Date   Abnormal Pap smear    Anemia    Anxiety    Gall bladder disease    GERD (gastroesophageal reflux disease)    H/O hiatal hernia    Irregular periods 04/30/2014   Patient desires pregnancy 12/11/2013   Pregnant 10/26/2014   Reflux 11/09/2014   Thyroid disease    Unspecified symptom associated with female genital organs 12/11/2013   Vaginal discharge 12/11/2013   Vaginal Pap smear, abnormal    >10 years ago, no treatment   Family History  Problem Relation Age of Onset   Diabetes Mother    Heart disease Paternal Grandfather    Diabetes Paternal Grandfather    Diabetes Maternal Grandmother    Hypertension Father    Heart disease Maternal  Grandfather    Gout Brother    Diabetes Maternal Aunt    Colon cancer Neg Hx    Stomach cancer Neg Hx    Pancreatic cancer Neg Hx    Esophageal cancer Neg Hx    Liver disease Neg Hx    Rectal cancer Neg Hx    Social History   Socioeconomic History   Marital status: Married    Spouse name: Not on file   Number of children: 2   Years of education: Not on file   Highest education level: Not on file  Occupational History   Occupation: PE teacher in [place]  Tobacco Use   Smoking status: Former    Packs/day: 0.25    Years: 3.00     Pack years: 0.75    Types: Cigarettes    Quit date: 07/14/2002    Years since quitting: 18.8   Smokeless tobacco: Never  Vaping Use   Vaping Use: Never used  Substance and Sexual Activity   Alcohol use: No    Comment: not now   Drug use: No   Sexual activity: Yes    Birth control/protection: Surgical    Comment: vasectomy   Other Topics Concern   Not on file  Social History Narrative   Not on file   Social Determinants of Health   Financial Resource Strain: Low Risk    Difficulty of Paying Living Expenses: Not hard at all  Food Insecurity: No Food Insecurity   Worried About Programme researcher, broadcasting/film/video in the Last Year: Never true   Ran Out of Food in the Last Year: Never true  Transportation Needs: No Transportation Needs   Lack of Transportation (Medical): No   Lack of Transportation (Non-Medical): No  Physical Activity: Sufficiently Active   Days of Exercise per Week: 5 days   Minutes of Exercise per Session: 30 min  Stress: No Stress Concern Present   Feeling of Stress : Not at all  Social Connections: Socially Integrated   Frequency of Communication with Friends and Family: More than three times a week   Frequency of Social Gatherings with Friends and Family: Three times a week   Attends Religious Services: 1 to 4 times per year   Active Member of Clubs or Organizations: Yes   Attends Banker Meetings: 1 to 4 times per year   Marital Status: Married  Catering manager Violence: Not At Risk   Fear of Current or Ex-Partner: No   Emotionally Abused: No   Physically Abused: No   Sexually Abused: No           Mardella Layman, MD 05/14/21 [phone]

## 2021-05-14 NOTE — ED Triage Notes (Signed)
PT also notes she had eye pain and drainage in right eye recently.

## 2021-05-18 LAB — HSV CULTURE AND TYPING

## 2021-05-19 ENCOUNTER — Telehealth: Payer: Self-pay | Admitting: Emergency Medicine

## 2021-05-19 ENCOUNTER — Telehealth: Payer: Self-pay | Admitting: Internal Medicine

## 2021-05-19 MED ORDER — ACYCLOVIR 400 MG PO TABS: 400.0000 mg | ORAL_TABLET | Freq: Three times a day (TID) | ORAL | 0 refills | Status: AC

## 2021-05-19 NOTE — Telephone Encounter (Signed)
Patient states her nose is bleeding and inflamed  Patient requesting a call back to discuss symptoms and otc medications   Offered patient an appt at another [facility] location, patient declined

## 2021-05-20 NOTE — Telephone Encounter (Signed)
Spoke with patient today.  She states her nose is extremely red and raw in the inside to the point where when she blew her nose it was blood on the tissue paper.  She has been using her sinus rinse every morning and night due to sinus infection.  She was wanting to know what you might recommend over the counter that she could use for the rawness inside her nose.  She states she was using sinus rinse to clear out yellow and green mucus which has now cleared up.

## 2021-05-20 NOTE — Telephone Encounter (Signed)
Spoke with patient today. 

## 2021-05-20 NOTE — Telephone Encounter (Signed)
For the nose can use saline nasal spray and vaseline for moisture

## 2021-05-21 ENCOUNTER — Ambulatory Visit: Payer: BC Managed Care – PPO | Admitting: Adult Health

## 2021-06-03 ENCOUNTER — Other Ambulatory Visit: Payer: Self-pay

## 2021-06-03 ENCOUNTER — Ambulatory Visit
Admission: RE | Admit: 2021-06-03 | Discharge: 2021-06-03 | Disposition: A | Discharge status: Home / Self Care | Payer: BC Managed Care – PPO | Source: Ambulatory Visit | Attending: Physician Assistant | Admitting: Physician Assistant

## 2021-06-03 VITALS — BP 119/76 | HR 88 | Temp 98.5°F | Resp 18 | Ht 62.0 in | Wt 118.0 lb

## 2021-06-03 DIAGNOSIS — J0120 Acute ethmoidal sinusitis, unspecified: Secondary | ICD-10-CM | POA: Diagnosis not present

## 2021-06-03 MED ORDER — CEFDINIR 300 MG PO CAPS: 300.0000 mg | ORAL_CAPSULE | Freq: Two times a day (BID) | ORAL | 0 refills | Status: DC

## 2021-06-03 NOTE — ED Triage Notes (Signed)
Pt reports was diagnosed with flu x5 days ago. Pt reports sinus pressure, right ear pain, and intermittent headache for last several days. Pt reports is taking otc ibuprofen with minimal change in pain/relief.

## 2021-06-04 ENCOUNTER — Other Ambulatory Visit: Payer: Self-pay | Admitting: Internal Medicine

## 2021-06-04 NOTE — ED Provider Notes (Signed)
[facility]    CSN: [id number] Arrival date & time: 06/03/21  1654      History   Chief Complaint Chief Complaint  Patient presents with   Ear Pain    HPI Mindy Bright is a 40 y.o. female.   Pt reports she was diagnosed with influenza 5 days ago.  Pt reports she has a headache and sinus pressure.  Pt reports she has had sinus infections in the past and this feel the same.  Pt reports pain both sides of her nose.  Pt reports she also has pain in the left side of her head.    The history is provided by the patient. No language interpreter was used.  Headache Pain location:  Generalized Radiates to:  Does not radiate Severity currently:  Unable to specify Severity at highest:  Unable to specify Onset quality:  Gradual Duration:  5 hours Timing:  Constant Chronicity:  New Worsened by:  Nothing Associated symptoms: facial pain and sinus pressure    Past Medical History:  Diagnosis Date   Abnormal Pap smear    Anemia    Anxiety    Gall bladder disease    GERD (gastroesophageal reflux disease)    H/O hiatal hernia    Irregular periods 04/30/2014   Patient desires pregnancy 12/11/2013   Pregnant 10/26/2014   Reflux 11/09/2014   Thyroid disease    Unspecified symptom associated with female genital organs 12/11/2013   Vaginal discharge 12/11/2013   Vaginal Pap smear, abnormal    >10 years ago, no treatment    Patient Active Problem List   Diagnosis Date Noted   Urinary frequency 03/19/2021   Muscle twitching 03/19/2021   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   Paresthesias 02/29/2020   RUQ abdominal pain 09/27/2019   Encounter for cervical Pap smear with pelvic exam 03/29/2019   Family history of diabetes mellitus (DM) 01/05/2019   Hyperglycemia 01/04/2019   History of abnormal cervical Pap smear 12/25/2016   Encounter for gynecological examination with Papanicolaou smear of cervix 12/25/2016   Hypothyroidism 09/06/2016   GERD (gastroesophageal reflux  disease) 11/09/2014    Past Surgical History:  Procedure Laterality Date   COLONOSCOPY  07/2019   UPPER GI ENDOSCOPY  07/2019   WISDOM TOOTH EXTRACTION      OB History     Gravida  2   Para  2   Term  2   Preterm      AB      Living  2      SAB      IAB      Ectopic      Multiple  0   Live Births  2            Home Medications    Prior to Admission medications   Medication Sig Start Date End Date Taking? Authorizing Provider  cefdinir (OMNICEF) 300 MG capsule Take 1 capsule (300 mg total) by mouth 2 (two) times daily. 06/03/21  Yes Cheron Schaumann K, PA-C  ALPRAZolam Prudy Feeler) 0.25 MG tablet Take 1 tablet (0.25 mg total) by mouth daily as needed for anxiety. 03/21/21   Pincus Sanes, MD  doxycycline (VIBRAMYCIN) 100 MG capsule Take 1 capsule (100 mg total) by mouth 2 (two) times daily. Patient not taking: Reported on 06/03/2021 05/14/21   Mardella Layman, MD  famotidine (PEPCID) 20 MG tablet TAKE 1 TABLET(20 MG) BY MOUTH TWICE DAILY 04/30/21   Myrlene Broker, MD  ibuprofen (ADVIL) 600  MG tablet Take 600 mg by mouth every 6 (six) hours as needed. 08/21/20   [provider]  levothyroxine (SYNTHROID) 50 MCG tablet TAKE 1 TABLET BY MOUTH EVERY DAY BEFORE BREAKFAST 04/26/20   Burns, Claudina Lick, MD  mupirocin ointment (BACTROBAN) 2 % Apply 1 application topically 2 (two) times daily. 05/14/21   Vanessa Kick, MD  nitrofurantoin, macrocrystal-monohydrate, (MACROBID) 100 MG capsule Take 1 capsule (100 mg total) by mouth 2 (two) times daily. Patient not taking: Reported on 06/03/2021 03/21/21   Binnie Rail, MD  tretinoin (RETIN-A) 0.05 % cream Apply topically at bedtime. Patient not taking: Reported on 06/03/2021 12/05/20 12/05/21  Warren Danes, PA-C    Family History Family History  Problem Relation Age of Onset   Diabetes Mother    Heart disease Paternal Grandfather    Diabetes Paternal Grandfather    Diabetes Maternal Grandmother    Hypertension  Father    Heart disease Maternal Grandfather    Gout Brother    Diabetes Maternal Aunt    Colon cancer Neg Hx    Stomach cancer Neg Hx    Pancreatic cancer Neg Hx    Esophageal cancer Neg Hx    Liver disease Neg Hx    Rectal cancer Neg Hx     Social History Social History   Tobacco Use   Smoking status: Former    Packs/day: 0.25    Years: 3.00    Pack years: 0.75    Types: Cigarettes    Quit date: 07/14/2002    Years since quitting: 18.9   Smokeless tobacco: Never  Vaping Use   Vaping Use: Never used  Substance Use Topics   Alcohol use: No    Comment: not now   Drug use: No     Allergies   Penicillins   Review of Systems Review of Systems  HENT:  Positive for sinus pressure.   Neurological:  Positive for headaches.  All other systems reviewed and are negative.   Physical Exam Triage Vital Signs ED Triage Vitals [06/03/21 1744]  Enc Vitals Group     BP 119/76     Pulse Rate 88     Resp 18     Temp 98.5 F (36.9 C)     Temp Source Oral     SpO2 98 %     Weight 118 lb (53.5 kg)     Height 5\' 2"  (1.575 m)     Head Circumference      Peak Flow      Pain Score 3     Pain Loc      Pain Edu?      Excl. in [place]?    No data found.  Updated Vital Signs BP 119/76 (BP Location: Right Arm)   Pulse 88   Temp 98.5 F (36.9 C) (Oral)   Resp 18   Ht 5\' 2"  (1.575 m)   Wt 53.5 kg   LMP 05/27/2021   SpO2 98%   BMI 21.58 kg/m   Visual Acuity Right Eye Distance:   Left Eye Distance:   Bilateral Distance:    Right Eye Near:   Left Eye Near:    Bilateral Near:     Physical Exam Constitutional:      Appearance: She is well-developed.  HENT:     Head: Normocephalic and atraumatic.     Comments: Tender maxillary and frontal sinus area     Mouth/Throat:     Pharynx: No oropharyngeal exudate or posterior  oropharyngeal erythema.  Eyes:     Conjunctiva/sclera: Conjunctivae normal.     Pupils: Pupils are equal, round, and reactive to light.   Cardiovascular:     Rate and Rhythm: Normal rate.  Pulmonary:     Effort: Pulmonary effort is normal.  Abdominal:     Palpations: Abdomen is soft.  Musculoskeletal:        General: Normal range of motion.     Cervical back: Normal range of motion and neck supple.  Skin:    General: Skin is warm and dry.  Neurological:     Mental Status: She is alert and oriented to person, place, and time.  Psychiatric:        Mood and Affect: Mood normal.     UC Treatments / Results  Labs (all labs ordered are listed, but only abnormal results are displayed) Labs Reviewed - No data to display  EKG   Radiology No results found.  Procedures Procedures (including critical care time)  Medications Ordered in UC Medications - No data to display  Initial Impression / Assessment and Plan / UC Course  I have reviewed the triage vital signs and the nursing notes.  Pertinent labs & imaging results that were available during my care of the patient were reviewed by me and considered in my medical decision making (see chart for details).    Final Clinical Impressions(s) / UC Diagnoses   Final diagnoses:  Acute ethmoidal sinusitis, recurrence not specified   Discharge Instructions   None    ED Prescriptions     Medication Sig Dispense Auth. Provider   cefdinir (OMNICEF) 300 MG capsule Take 1 capsule (300 mg total) by mouth 2 (two) times daily. 20 capsule Fransico Meadow, [place]      PDMP not reviewed this encounter.   Fransico Meadow, [place] 06/04/21 1911

## 2021-06-10 ENCOUNTER — Ambulatory Visit: Payer: BC Managed Care – PPO | Admitting: Emergency Medicine

## 2021-06-10 ENCOUNTER — Other Ambulatory Visit: Payer: Self-pay

## 2021-06-10 ENCOUNTER — Encounter: Payer: Self-pay | Admitting: Emergency Medicine

## 2021-06-10 VITALS — BP 98/56 | HR 70 | Temp 98.2°F | Ht 62.0 in | Wt 120.0 lb

## 2021-06-10 DIAGNOSIS — B349 Viral infection, unspecified: Secondary | ICD-10-CM

## 2021-06-10 DIAGNOSIS — J0110 Acute frontal sinusitis, unspecified: Secondary | ICD-10-CM | POA: Diagnosis not present

## 2021-06-10 MED ORDER — AZITHROMYCIN 250 MG PO TABS: ORAL_TABLET | ORAL | 0 refills | Status: DC

## 2021-06-10 NOTE — Assessment & Plan Note (Signed)
Status post recent influenza infection.  Develops secondary bacterial sinus infection which was partially treated.  Antibiotic was discontinued due to side effects. No red flag signs or symptoms. Start azithromycin for 5 days.  Patient is allergic to penicillin. Advised to take over-the-counter Mucinex-D for sinus congestion, Tylenol and or NSAIDs for headaches, stay well-hydrated and rest.

## 2021-06-10 NOTE — Progress Notes (Signed)
Mindy Bright 40 y.o.   Chief Complaint  Patient presents with   Follow-up    F/u having having flu; sinus headaches    HISTORY OF PRESENT ILLNESS: Here today for acute problem. This is a 40 y.o. female complaining of flu symptoms, mostly sinus headaches. Was diagnosed with the flu type a on 05/25/2021.  Was not treated with antiviral. Went to urgent care center 1 week ago and was diagnosed with left ear infection.  Was started on cefdinir but after 3 days she had to stop because of GI side effects. Still having some residual lingering flulike symptoms, mostly congestion and sinus headaches. No other complaints or medical concerns today.  HPI   Prior to Admission medications   Medication Sig Start Date End Date Taking? Authorizing Provider  ALPRAZolam (XANAX) 0.25 MG tablet Take 1 tablet (0.25 mg total) by mouth daily as needed for anxiety. 03/21/21  Yes Burns, Bobette Mo, MD  famotidine (PEPCID) 20 MG tablet TAKE 1 TABLET(20 MG) BY MOUTH TWICE DAILY 04/30/21  Yes Myrlene Broker, MD  ibuprofen (ADVIL) 600 MG tablet Take 600 mg by mouth every 6 (six) hours as needed. 08/21/20  Yes [provider]  levothyroxine (SYNTHROID) 50 MCG tablet TAKE 1 TABLET BY MOUTH EVERY DAY BEFORE BREAKFAST 06/05/21  Yes Burns, Bobette Mo, MD  mupirocin ointment (BACTROBAN) 2 % Apply 1 application topically 2 (two) times daily. 05/14/21  Yes Hagler, Arlys John, MD  tretinoin (RETIN-A) 0.05 % cream Apply topically at bedtime. 12/05/20 12/05/21 Yes Sheffield, Judye Bos, PA-C    Allergies  Allergen Reactions   Penicillins Swelling    Has patient had a PCN reaction causing immediate rash, facial/tongue/throat swelling, SOB or lightheadedness with hypotension: Yes Has patient had a PCN reaction causing severe rash involving mucus membranes or skin necrosis: No Has patient had a PCN reaction that required hospitalization Yes Has patient had a PCN reaction occurring within the last 10 years: No If all of the  above answers are "NO", then may proceed with Cephalosporin use.    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   Paresthesias 02/29/2020   Family history of diabetes mellitus (DM) 01/05/2019   History of abnormal cervical Pap smear 12/25/2016   Hypothyroidism 09/06/2016   GERD (gastroesophageal reflux disease) 11/09/2014    Past Medical History:  Diagnosis Date   Abnormal Pap smear    Anemia    Anxiety    Gall bladder disease    GERD (gastroesophageal reflux disease)    H/O hiatal hernia    Irregular periods 04/30/2014   Patient desires pregnancy 12/11/2013   Pregnant 10/26/2014   Reflux 11/09/2014   Thyroid disease    Unspecified symptom associated with female genital organs 12/11/2013   Vaginal discharge 12/11/2013   Vaginal Pap smear, abnormal    >10 years ago, no treatment    Past Surgical History:  Procedure Laterality Date   COLONOSCOPY  07/2019   UPPER GI ENDOSCOPY  07/2019   WISDOM TOOTH EXTRACTION      Social History   Socioeconomic History   Marital status: Married    Spouse name: Not on file   Number of children: 2   Years of education: Not on file   Highest education level: Not on file  Occupational History   Occupation: PE teacher in [place]  Tobacco Use   Smoking status: Former    Packs/day: 0.25    Years: 3.00  Pack years: 0.75    Types: Cigarettes    Quit date: 07/14/2002    Years since quitting: 18.9   Smokeless tobacco: Never  Vaping Use   Vaping Use: Never used  Substance and Sexual Activity   Alcohol use: No    Comment: not now   Drug use: No   Sexual activity: Yes    Birth control/protection: Surgical    Comment: vasectomy   Other Topics Concern   Not on file  Social History Narrative   Not on file   Social Determinants of Health   Financial Resource Strain: Low Risk    Difficulty of Paying Living Expenses: Not hard at all  Food Insecurity: No Food Insecurity   Worried About Community education officer in the Last Year: Never true   Ran Out of Food in the Last Year: Never true  Transportation Needs: No Transportation Needs   Lack of Transportation (Medical): No   Lack of Transportation (Non-Medical): No  Physical Activity: Sufficiently Active   Days of Exercise per Week: 5 days   Minutes of Exercise per Session: 30 min  Stress: No Stress Concern Present   Feeling of Stress : Not at all  Social Connections: Socially Integrated   Frequency of Communication with Friends and Family: More than three times a week   Frequency of Social Gatherings with Friends and Family: Three times a week   Attends Religious Services: 1 to 4 times per year   Active Member of Clubs or Organizations: Yes   Attends Banker Meetings: 1 to 4 times per year   Marital Status: Married  Catering manager Violence: Not At Risk   Fear of Current or Ex-Partner: No   Emotionally Abused: No   Physically Abused: No   Sexually Abused: No    Family History  Problem Relation Age of Onset   Diabetes Mother    Heart disease Paternal Grandfather    Diabetes Paternal Grandfather    Diabetes Maternal Grandmother    Hypertension Father    Heart disease Maternal Grandfather    Gout Brother    Diabetes Maternal Aunt    Colon cancer Neg Hx    Stomach cancer Neg Hx    Pancreatic cancer Neg Hx    Esophageal cancer Neg Hx    Liver disease Neg Hx    Rectal cancer Neg Hx      Review of Systems  Constitutional: Negative.  Negative for chills and fever.  HENT:  Positive for congestion. Negative for sore throat.   Respiratory: Negative.  Negative for cough and shortness of breath.   Cardiovascular: Negative.  Negative for chest pain and palpitations.  Gastrointestinal: Negative.  Negative for abdominal pain, diarrhea, nausea and vomiting.  Genitourinary: Negative.   Musculoskeletal: Negative.   Skin: Negative.  Negative for rash.  Neurological:  Positive for headaches. Negative for dizziness.   All other systems reviewed and are negative.  Today's Vitals   06/10/21 1320  BP: (!) 98/56  Pulse: 70  Temp: 98.2 F (36.8 C)  TempSrc: Oral  SpO2: 99%  Weight: 120 lb (54.4 kg)  Height: 5\' 2"  (1.575 m)   Body mass index is 21.95 kg/m.  Physical Exam Vitals reviewed.  Constitutional:      Appearance: Normal appearance.  HENT:     Head: Normocephalic.     Right Ear: Tympanic membrane, ear canal and external ear normal.     Left Ear: Tympanic membrane, ear canal and external ear normal.  Nose: No congestion or rhinorrhea.     Mouth/Throat:     Mouth: Mucous membranes are moist.     Pharynx: Oropharynx is clear.  Eyes:     Extraocular Movements: Extraocular movements intact.     Conjunctiva/sclera: Conjunctivae normal.     Pupils: Pupils are equal, round, and reactive to light.  Cardiovascular:     Rate and Rhythm: Normal rate and regular rhythm.     Pulses: Normal pulses.     Heart sounds: Normal heart sounds.  Pulmonary:     Effort: Pulmonary effort is normal.     Breath sounds: Normal breath sounds.  Musculoskeletal:        General: Normal range of motion.     Cervical back: Normal range of motion and neck supple. No tenderness.  Lymphadenopathy:     Cervical: No cervical adenopathy.  Skin:    General: Skin is warm and dry.  Neurological:     General: No focal deficit present.     Mental Status: She is alert and oriented to person, place, and time.  Psychiatric:        Mood and Affect: Mood normal.        Behavior: Behavior normal.     ASSESSMENT & PLAN: Problem List Items Addressed This Visit       Respiratory   Acute non-recurrent frontal sinusitis - Primary    Status post recent influenza infection.  Develops secondary bacterial sinus infection which was partially treated.  Antibiotic was discontinued due to side effects. No red flag signs or symptoms. Start azithromycin for 5 days.  Patient is allergic to penicillin. Advised to take  over-the-counter Mucinex-D for sinus congestion, Tylenol and or NSAIDs for headaches, stay well-hydrated and rest.      Relevant Medications   azithromycin (ZITHROMAX) 250 MG tablet   Other Visit Diagnoses     Viral illness       Recent influenza infection   Relevant Medications   azithromycin (ZITHROMAX) 250 MG tablet      Patient Instructions  Sinusitis, Adult Sinusitis is soreness and swelling (inflammation) of your sinuses. Sinuses are hollow spaces in the bones around your face. They are located: Around your eyes. In the middle of your forehead. Behind your nose. In your cheekbones. Your sinuses and nasal passages are lined with a fluid called mucus. Mucus drains out of your sinuses. Swelling can trap mucus in your sinuses. This lets germs (bacteria, virus, or fungus) grow, which leads to infection. Most of the time, this condition is caused by a virus. What are the causes? This condition is caused by: Allergies. Asthma. Germs. Things that block your nose or sinuses. Growths in the nose (nasal polyps). Chemicals or irritants in the air. Fungus (rare). What increases the risk? You are more likely to develop this condition if: You have a weak body defense system (immune system). You do a lot of swimming or diving. You use nasal sprays too much. You smoke. What are the signs or symptoms? The main symptoms of this condition are pain and a feeling of pressure around the sinuses. Other symptoms include: Stuffy nose (congestion). Runny nose (drainage). Swelling and warmth in the sinuses. Headache. Toothache. A cough that may get worse at night. Mucus that collects in the throat or the back of the nose (postnasal drip). Being unable to smell and taste. Being very tired (fatigue). A fever. Sore throat. Bad breath. How is this diagnosed? This condition is diagnosed based on: Your symptoms. Your  medical history. A physical exam. Tests to find out if your condition  is short-term (acute) or long-term (chronic). Your doctor may: Check your nose for growths (polyps). Check your sinuses using a tool that has a light (endoscope). Check for allergies or germs. Do imaging tests, such as an MRI or CT scan. How is this treated? Treatment for this condition depends on the cause and whether it is short-term or long-term. If caused by a virus, your symptoms should go away on their own within 10 days. You may be given medicines to relieve symptoms. They include: Medicines that shrink swollen tissue in the nose. Medicines that treat allergies (antihistamines). A spray that treats swelling of the nostrils.  Rinses that help get rid of thick mucus in your nose (nasal saline washes). If caused by bacteria, your doctor may wait to see if you will get better without treatment. You may be given antibiotic medicine if you have: A very bad infection. A weak body defense system. If caused by growths in the nose, you may need to have surgery. Follow these instructions at home: Medicines Take, use, or apply over-the-counter and prescription medicines only as told by your doctor. These may include nasal sprays. If you were prescribed an antibiotic medicine, take it as told by your doctor. Do not stop taking the antibiotic even if you start to feel better. Hydrate and humidify  Drink enough water to keep your pee (urine) pale yellow. Use a cool mist humidifier to keep the humidity level in your home above 50%. Breathe in steam for 10-15 minutes, 3-4 times a day, or as told by your doctor. You can do this in the bathroom while a hot shower is running. Try not to spend time in cool or dry air. Rest Rest as much as you can. Sleep with your head raised (elevated). Make sure you get enough sleep each night. General instructions  Put a warm, moist washcloth on your face 3-4 times a day, or as often as told by your doctor. This will help with discomfort. Wash your hands often  with soap and water. If there is no soap and water, use hand sanitizer. Do not smoke. Avoid being around people who are smoking (secondhand smoke). Keep all follow-up visits as told by your doctor. This is important. Contact a doctor if: You have a fever. Your symptoms get worse. Your symptoms do not get better within 10 days. Get help right away if: You have a very bad headache. You cannot stop throwing up (vomiting). You have very bad pain or swelling around your face or eyes. You have trouble seeing. You feel confused. Your neck is stiff. You have trouble breathing. Summary Sinusitis is swelling of your sinuses. Sinuses are hollow spaces in the bones around your face. This condition is caused by tissues in your nose that become inflamed or swollen. This traps germs. These can lead to infection. If you were prescribed an antibiotic medicine, take it as told by your doctor. Do not stop taking it even if you start to feel better. Keep all follow-up visits as told by your doctor. This is important. This information is not intended to replace advice given to you by your health care provider. Make sure you discuss any questions you have with your health care provider. Document Revised: 11/15/2017 Document Reviewed: 11/15/2017 Elsevier Patient Education  2022 Elsevier Inc.    Edwina Barth, MD [facility]

## 2021-06-10 NOTE — Patient Instructions (Signed)

## 2021-06-24 ENCOUNTER — Telehealth: Payer: Self-pay | Admitting: Internal Medicine

## 2021-06-24 NOTE — Telephone Encounter (Signed)
Patient covid+ (cough.. low grade 100.0 fever.. congestion)  Has VV scheduled w/ Sagardia @ 3:20pm 12.28.22.. would like some OTC opts until visit  Please call patient [phone]

## 2021-06-24 NOTE — Telephone Encounter (Signed)
Spoke with patient and OTC medication recommendations given.

## 2021-06-25 ENCOUNTER — Telehealth: Payer: BC Managed Care – PPO | Admitting: Emergency Medicine

## 2021-06-25 ENCOUNTER — Other Ambulatory Visit: Payer: Self-pay

## 2021-07-24 ENCOUNTER — Other Ambulatory Visit: Payer: BC Managed Care – PPO | Admitting: Adult Health

## 2021-08-04 ENCOUNTER — Encounter: Payer: Self-pay | Admitting: Internal Medicine

## 2021-08-04 MED ORDER — PROMETHAZINE HCL 25 MG PO TABS: 25.0000 mg | ORAL_TABLET | Freq: Three times a day (TID) | ORAL | 0 refills | Status: DC | PRN

## 2021-08-18 ENCOUNTER — Encounter: Payer: BC Managed Care – PPO | Admitting: Internal Medicine

## 2021-08-19 ENCOUNTER — Encounter: Payer: BC Managed Care – PPO | Admitting: Internal Medicine

## 2021-09-09 ENCOUNTER — Other Ambulatory Visit: Payer: BC Managed Care – PPO | Admitting: Adult Health

## 2021-09-30 ENCOUNTER — Encounter: Payer: Self-pay | Admitting: Internal Medicine

## 2021-09-30 NOTE — Patient Instructions (Addendum)
? ? ? ?Blood work was ordered.   ? ? ?Medications changes include :   none ? ? ? ?Return in about 1 year (around 10/02/2022) for CPE. ? ? ? ?Health Maintenance, Female ?Adopting a healthy lifestyle and getting preventive care are important in promoting health and wellness. Ask your health care provider about: ?The right schedule for you to have regular tests and exams. ?Things you can do on your own to prevent diseases and keep yourself healthy. ?What should I know about diet, weight, and exercise? ?Eat a healthy diet ? ?Eat a diet that includes plenty of vegetables, fruits, low-fat dairy products, and lean protein. ?Do not eat a lot of foods that are high in solid fats, added sugars, or sodium. ?Maintain a healthy weight ?Body mass index (BMI) is used to identify weight problems. It estimates body fat based on height and weight. Your health care provider can help determine your BMI and help you achieve or maintain a healthy weight. ?Get regular exercise ?Get regular exercise. This is one of the most important things you can do for your health. Most adults should: ?Exercise for at least 150 minutes each week. The exercise should increase your heart rate and make you sweat (moderate-intensity exercise). ?Do strengthening exercises at least twice a week. This is in addition to the moderate-intensity exercise. ?Spend less time sitting. Even light physical activity can be beneficial. ?Watch cholesterol and blood lipids ?Have your blood tested for lipids and cholesterol at 41 years of age, then have this test every 5 years. ?Have your cholesterol levels checked more often if: ?Your lipid or cholesterol levels are high. ?You are older than 41 years of age. ?You are at high risk for heart disease. ?What should I know about cancer screening? ?Depending on your health history and family history, you may need to have cancer screening at various ages. This may include screening for: ?Breast cancer. ?Cervical cancer. ?Colorectal  cancer. ?Skin cancer. ?Lung cancer. ?What should I know about heart disease, diabetes, and high blood pressure? ?Blood pressure and heart disease ?High blood pressure causes heart disease and increases the risk of stroke. This is more likely to develop in people who have high blood pressure readings or are overweight. ?Have your blood pressure checked: ?Every 3-5 years if you are 20-44 years of age. ?Every year if you are 19 years old or older. ?Diabetes ?Have regular diabetes screenings. This checks your fasting blood sugar level. Have the screening done: ?Once every three years after age 46 if you are at a normal weight and have a low risk for diabetes. ?More often and at a younger age if you are overweight or have a high risk for diabetes. ?What should I know about preventing infection? ?Hepatitis B ?If you have a higher risk for hepatitis B, you should be screened for this virus. Talk with your health care provider to find out if you are at risk for hepatitis B infection. ?Hepatitis C ?Testing is recommended for: ?Everyone born from [birth year] through 1965. ?Anyone with known risk factors for hepatitis C. ?Sexually transmitted infections (STIs) ?Get screened for STIs, including gonorrhea and chlamydia, if: ?You are sexually active and are younger than 41 years of age. ?You are older than 41 years of age and your health care provider tells you that you are at risk for this type of infection. ?Your sexual activity has changed since you were last screened, and you are at increased risk for chlamydia or gonorrhea. Ask your health care  provider if you are at risk. ?Ask your health care provider about whether you are at high risk for HIV. Your health care provider may recommend a prescription medicine to help prevent HIV infection. If you choose to take medicine to prevent HIV, you should first get tested for HIV. You should then be tested every 3 months for as long as you are taking the medicine. ?Pregnancy ?If you are  about to stop having your period (premenopausal) and you may become pregnant, seek counseling before you get pregnant. ?Take 400 to 800 micrograms (mcg) of folic acid every day if you become pregnant. ?Ask for birth control (contraception) if you want to prevent pregnancy. ?Osteoporosis and menopause ?Osteoporosis is a disease in which the bones lose minerals and strength with aging. This can result in bone fractures. If you are 40 years old or older, or if you are at risk for osteoporosis and fractures, ask your health care provider if you should: ?Be screened for bone loss. ?Take a calcium or vitamin D supplement to lower your risk of fractures. ?Be given hormone replacement therapy (HRT) to treat symptoms of menopause. ?Follow these instructions at home: ?Alcohol use ?Do not drink alcohol if: ?Your health care provider tells you not to drink. ?You are pregnant, may be pregnant, or are planning to become pregnant. ?If you drink alcohol: ?Limit how much you have to: ?0-1 drink a day. ?Know how much alcohol is in your drink. In the [place] one drink equals one 12 oz bottle of beer (355 mL), one 5 oz glass of wine (148 mL), or one 1? oz glass of hard liquor (44 mL). ?Lifestyle ?Do not use any products that contain nicotine or tobacco. These products include cigarettes, chewing tobacco, and vaping devices, such as e-cigarettes. If you need help quitting, ask your health care provider. ?Do not use street drugs. ?Do not share needles. ?Ask your health care provider for help if you need support or information about quitting drugs. ?General instructions ?Schedule regular health, dental, and eye exams. ?Stay current with your vaccines. ?Tell your health care provider if: ?You often feel depressed. ?You have ever been abused or do not feel safe at home. ?Summary ?Adopting a healthy lifestyle and getting preventive care are important in promoting health and wellness. ?Follow your health care provider's instructions about  healthy diet, exercising, and getting tested or screened for diseases. ?Follow your health care provider's instructions on monitoring your cholesterol and blood pressure. ?This information is not intended to replace advice given to you by your health care provider. Make sure you discuss any questions you have with your health care provider. ?Document Revised: 11/04/2020 Document Reviewed: 11/04/2020 ?Elsevier Patient Education ? [place]. ? ?

## 2021-09-30 NOTE — Progress Notes (Signed)
? ? ?Subjective:  ? ? Patient ID: Mindy Bright, female    DOB: 13-May-1981, 41 y.o.   MRN: [id number] ? ? ?This visit occurred during the SARS-CoV-2 public health emergency.  Safety protocols were in place, including screening questions prior to the visit, additional usage of staff PPE, and extensive cleaning of exam room while observing appropriate contact time as indicated for disinfecting solutions. ? ? ? ?HPI ?Mindy Bright is here for  ?Chief Complaint  ?Patient presents with  ? Annual Exam  ? ? ? ?She denies changes.  She has no concerns.   ? ? ?Intermittent eye twitching.   ? ? ? ?Medications and allergies reviewed with patient and updated if appropriate. ? ? ? ?Current Outpatient Medications on File Prior to Visit  ?Medication Sig Dispense Refill  ? ALPRAZolam (XANAX) 0.25 MG tablet Take 1 tablet (0.25 mg total) by mouth daily as needed for anxiety. 30 tablet 0  ? famotidine (PEPCID) 20 MG tablet TAKE 1 TABLET(20 MG) BY MOUTH TWICE DAILY 180 tablet 3  ? levothyroxine (SYNTHROID) 50 MCG tablet TAKE 1 TABLET BY MOUTH EVERY DAY BEFORE BREAKFAST 90 tablet 3  ? tretinoin (RETIN-A) 0.05 % cream Apply topically at bedtime. 45 g 0  ? ?No current facility-administered medications on file prior to visit.  ? ? ?Review of Systems  ?Constitutional:  Negative for fever.  ?Eyes:  Negative for visual disturbance.  ?     Eyelid twitching  ?Respiratory:  Negative for cough, shortness of breath and wheezing.   ?Cardiovascular:  Negative for chest pain, palpitations and leg swelling.  ?Gastrointestinal:  Negative for abdominal pain, blood in stool, constipation, diarrhea and nausea.  ?     Genella Rife  ?Genitourinary:  Negative for dysuria.  ?Musculoskeletal:  Positive for back pain (chronic - sees chiropractor regularly). Negative for arthralgias.  ?Skin:  Negative for rash.  ?Neurological:  Negative for dizziness, light-headedness and headaches.  ?Psychiatric/Behavioral:  Negative for dysphoric mood. The patient is not nervous/anxious.    ? ?   ?Objective:  ? ?Vitals:  ? 10/01/21 0826  ?BP: 102/68  ?Pulse: 91  ?Temp: 98 ?F (36.7 ?C)  ?SpO2: 99%  ? ?Filed Weights  ? 10/01/21 0826  ?Weight: 113 lb 12.8 oz (51.6 kg)  ? ?Body mass index is 20.81 kg/m?. ? ?BP Readings from Last 3 Encounters:  ?10/01/21 102/68  ?06/10/21 (!) 98/56  ?06/03/21 119/76  ? ? ?Wt Readings from Last 3 Encounters:  ?10/01/21 113 lb 12.8 oz (51.6 kg)  ?06/10/21 120 lb (54.4 kg)  ?06/03/21 118 lb (53.5 kg)  ? ? ? ?  06/10/2021  ?  1:47 PM 07/17/2020  ?  3:52 PM 04/26/2020  ?  3:24 PM 03/29/2019  ?  4:14 PM 03/29/2019  ?  4:11 PM  ?Depression screen PHQ 2/9  ?Decreased Interest 0 0 0 0 0  ?Down, Depressed, Hopeless 0 0 0 0 0  ?PHQ - 2 Score 0 0 0 0 0  ?Altered sleeping  0  0 0  ?Tired, decreased energy  0  0 0  ?Change in appetite  0  0 0  ?Feeling bad or failure about yourself   0  0 0  ?Trouble concentrating  0  0 0  ?Moving slowly or fidgety/restless  0  0 0  ?Suicidal thoughts  0  0 0  ?PHQ-9 Score  0  0 0  ? ? ? ? ?  07/17/2020  ?  3:53 PM  ?GAD 7 : Generalized  Anxiety Score  ?Nervous, Anxious, on Edge 0  ?Control/stop worrying 0  ?Worry too much - different things 0  ?Trouble relaxing 0  ?Restless 0  ?Easily annoyed or irritable 0  ?Afraid - awful might happen 0  ?Total GAD 7 Score 0  ? ? ? ? ?  ?Physical Exam ?Constitutional: She appears well-developed and well-nourished. No distress.  ?HENT:  ?Head: Normocephalic and atraumatic.  ?Right Ear: External ear normal. Normal ear canal and TM ?Left Ear: External ear normal.  Normal ear canal and TM ?Mouth/Throat: Oropharynx is clear and moist.  ?Eyes: Conjunctivae and EOM are normal.  ?Neck: Neck supple. No tracheal deviation present. No thyromegaly present.  ?No carotid bruit  ?Cardiovascular: Normal rate, regular rhythm and normal heart sounds.   ?No murmur heard.  No edema. ?Pulmonary/Chest: Effort normal and breath sounds normal. No respiratory distress. She has no wheezes. She has no rales.  ?Breast: deferred   ?Abdominal: Soft.  She exhibits no distension. There is no tenderness.  ?Lymphadenopathy: She has no cervical adenopathy.  ?Skin: Skin is warm and dry. She is not diaphoretic.  ?Psychiatric: She has a normal mood and affect. Her behavior is normal.  ? ? ? ?Lab Results  ?Component Value Date  ? WBC 11.5 (H) 03/19/2021  ? HGB 12.4 03/19/2021  ? HCT 37.6 03/19/2021  ? PLT 343.0 03/19/2021  ? GLUCOSE 86 03/19/2021  ? CHOL 181 01/05/2019  ? TRIG 108.0 01/05/2019  ? HDL 56.30 01/05/2019  ? LDLCALC 103 (H) 01/05/2019  ? ALT 13 03/19/2021  ? AST 17 03/19/2021  ? NA 137 03/19/2021  ? K 3.9 03/19/2021  ? CL 101 03/19/2021  ? CREATININE 0.87 03/19/2021  ? BUN 18 03/19/2021  ? CO2 28 03/19/2021  ? TSH 4.04 03/19/2021  ? HGBA1C 5.8 03/19/2021  ? ? ? ? ?   ?Assessment & Plan:  ? ?Physical exam: ?Screening blood work  ordered ?Exercise  regular ?Weight  normal ?Substance abuse  none ? ? ?Reviewed recommended immunizations. ? ? ?Health Maintenance  ?Topic Date Due  ? COVID-19 Vaccine (1) 10/16/2021 (Originally 06/07/1981)  ? Hepatitis C Screening  04/26/2054 (Originally 12/07/1998)  ? INFLUENZA VACCINE  01/27/2022  ? PAP SMEAR-Modifier  07/18/2023  ? TETANUS/TDAP  03/25/2028  ? HIV Screening  Completed  ? HPV VACCINES  Aged Out  ?  ? ? ? ? ? ? ?See Problem List for Assessment and Plan of chronic medical problems. ? ? ? ? ?

## 2021-10-01 ENCOUNTER — Ambulatory Visit (INDEPENDENT_AMBULATORY_CARE_PROVIDER_SITE_OTHER): Payer: BC Managed Care – PPO | Admitting: Internal Medicine

## 2021-10-01 ENCOUNTER — Ambulatory Visit: Payer: BC Managed Care – PPO | Admitting: Adult Health

## 2021-10-01 ENCOUNTER — Other Ambulatory Visit (HOSPITAL_COMMUNITY)
Admission: RE | Admit: 2021-10-01 | Discharge: 2021-10-01 | Disposition: A | Discharge status: Home / Self Care | Payer: BC Managed Care – PPO | Source: Ambulatory Visit | Attending: Adult Health | Admitting: Adult Health

## 2021-10-01 ENCOUNTER — Encounter: Payer: BC Managed Care – PPO | Admitting: Internal Medicine

## 2021-10-01 ENCOUNTER — Encounter: Payer: Self-pay | Admitting: Adult Health

## 2021-10-01 VITALS — BP 113/77 | HR 71 | Ht 62.0 in | Wt 115.0 lb

## 2021-10-01 VITALS — BP 102/68 | HR 91 | Temp 98.0°F | Ht 62.0 in | Wt 113.8 lb

## 2021-10-01 DIAGNOSIS — Z0000 Encounter for general adult medical examination without abnormal findings: Secondary | ICD-10-CM

## 2021-10-01 DIAGNOSIS — Z124 Encounter for screening for malignant neoplasm of cervix: Secondary | ICD-10-CM

## 2021-10-01 DIAGNOSIS — Z1211 Encounter for screening for malignant neoplasm of colon: Secondary | ICD-10-CM | POA: Insufficient documentation

## 2021-10-01 DIAGNOSIS — N8189 Other female genital prolapse: Secondary | ICD-10-CM | POA: Diagnosis not present

## 2021-10-01 DIAGNOSIS — K219 Gastro-esophageal reflux disease without esophagitis: Secondary | ICD-10-CM

## 2021-10-01 DIAGNOSIS — Z833 Family history of diabetes mellitus: Secondary | ICD-10-CM | POA: Diagnosis not present

## 2021-10-01 DIAGNOSIS — E039 Hypothyroidism, unspecified: Secondary | ICD-10-CM

## 2021-10-01 DIAGNOSIS — Z136 Encounter for screening for cardiovascular disorders: Secondary | ICD-10-CM

## 2021-10-01 DIAGNOSIS — F418 Other specified anxiety disorders: Secondary | ICD-10-CM

## 2021-10-01 LAB — CBC WITH DIFFERENTIAL/PLATELET
Basophils Absolute: 0.1 10*3/uL (ref 0.0–0.1)
Basophils Relative: 0.8 % (ref 0.0–3.0)
Eosinophils Absolute: 0.3 10*3/uL (ref 0.0–0.7)
Eosinophils Relative: 2.4 % (ref 0.0–5.0)
HCT: 42.9 % (ref 36.0–46.0)
Hemoglobin: 14.2 g/dL (ref 12.0–15.0)
Lymphocytes Relative: 21.1 % (ref 12.0–46.0)
Lymphs Abs: 2.2 10*3/uL (ref 0.7–4.0)
MCHC: 33.2 g/dL (ref 30.0–36.0)
MCV: 85.2 fl (ref 78.0–100.0)
Monocytes Absolute: 0.6 10*3/uL (ref 0.1–1.0)
Monocytes Relative: 5.4 % (ref 3.0–12.0)
Neutro Abs: 7.5 10*3/uL (ref 1.4–7.7)
Neutrophils Relative %: 70.3 % (ref 43.0–77.0)
Platelets: 314 10*3/uL (ref 150.0–400.0)
RBC: 5.03 Mil/uL (ref 3.87–5.11)
RDW: 13.4 % (ref 11.5–15.5)
WBC: 10.6 10*3/uL — ABNORMAL HIGH (ref 4.0–10.5)

## 2021-10-01 LAB — LIPID PANEL
Cholesterol: 200 mg/dL (ref 0–200)
HDL: 55.5 mg/dL (ref >39.00)
LDL Cholesterol: 125 mg/dL — ABNORMAL HIGH (ref 0–99)
NonHDL: 144.34
Total CHOL/HDL Ratio: 4
Triglycerides: 95 mg/dL (ref 0.0–149.0)
VLDL: 19 mg/dL (ref 0.0–40.0)

## 2021-10-01 LAB — COMPREHENSIVE METABOLIC PANEL
ALT: 16 U/L (ref 0–35)
AST: 19 U/L (ref 0–37)
Albumin: 4.7 g/dL (ref 3.5–5.2)
Alkaline Phosphatase: 74 U/L (ref 39–117)
BUN: 17 mg/dL (ref 6–23)
CO2: 27 mEq/L (ref 19–32)
Calcium: 10.1 mg/dL (ref 8.4–10.5)
Chloride: 100 mEq/L (ref 96–112)
Creatinine, Ser: 0.81 mg/dL (ref 0.40–1.20)
GFR: 90.55 mL/min (ref >60.00)
Glucose, Bld: 93 mg/dL (ref 70–99)
Potassium: 4.1 mEq/L (ref 3.5–5.1)
Sodium: 135 mEq/L (ref 135–145)
Total Bilirubin: 0.9 mg/dL (ref 0.2–1.2)
Total Protein: 7.7 g/dL (ref 6.0–8.3)

## 2021-10-01 LAB — HEMOCCULT GUIAC POC 1CARD (OFFICE): Fecal Occult Blood, POC: NEGATIVE

## 2021-10-01 LAB — HEMOGLOBIN A1C: Hgb A1c MFr Bld: 5.8 % (ref 4.6–6.5)

## 2021-10-01 LAB — TSH: TSH: 3.87 u[IU]/mL (ref 0.35–5.50)

## 2021-10-01 NOTE — Assessment & Plan Note (Signed)
Chronic  Clinically euthyroid Currently taking levothyroxine 50 mcg daily Check tsh  Titrate med dose if needed  

## 2021-10-01 NOTE — Assessment & Plan Note (Signed)
Chronic GERD controlled Continue pepcid 20 mg bid  

## 2021-10-01 NOTE — Assessment & Plan Note (Signed)
H/o DM ?a1c ?

## 2021-10-01 NOTE — Progress Notes (Signed)
Patient ID: Mindy Bright, female   DOB: 12/22/1980, 41 y.o.   MRN: [id number] ?History of Present Illness: ?Mindy Bright is a 40 year white female, married, G2P2 in for pap and pelvic, had physical with PCP. Periods are regular but having more clots. She is still teaching, and is active.  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  ?PCP is Dr Lawerance Bach. ? ? ?Current Medications, Allergies, Past Medical History, Past Surgical History, Family History and Social History were reviewed in [employer] record.   ? ? ?Review of Systems: ? ?Patient denies any headaches, hearing loss, fatigue, blurred vision, shortness of breath, chest pain, abdominal pain, problems with bowel movements, urination, or intercourse. No joint pain or mood swings.  ?+clotting with periods ? ?Physical Exam:BP 113/77 (BP Location: Right Arm, Patient Position: Sitting, Cuff Size: Normal)   Pulse 71   Ht 5\' 2"  (1.575 m)   Wt 115 lb (52.2 kg)   LMP 09/12/2021 (Approximate)   BMI 21.03 kg/m?   ?General:  Well developed, well nourished, no acute distress ?Skin:  Warm and dry ?Pelvic:  External genitalia is normal in appearance, no lesions.  The vagina is normal in appearance. Urethra has no lesions or masses. The cervix is smooth and bulbous.Pap with HR HPV performed and has mild uterine descensus.  Uterus is felt to be normal size, shape, and contour.  No adnexal masses or tenderness noted.Bladder is non tender, no masses felt. ?Rectal: Good sphincter tone, no polyps, or hemorrhoids felt.  Hemoccult negative. ?Extremities/musculoskeletal:  No swelling or varicosities noted, no clubbing or cyanosis ?Psych:  No mood changes, alert and cooperative,seems happy ?AA is 0 ?Fall risk is low ? ?  10/01/2021  ?  1:30 PM 10/01/2021  ? 11:18 AM 06/10/2021  ?  1:47 PM  ?Depression screen PHQ 2/9  ?Decreased Interest 0 0 0  ?Down, Depressed, Hopeless 0 0 0  ?PHQ - 2 Score 0 0 0  ?Altered sleeping 0    ?Tired, decreased energy  1    ?Change in appetite 0    ?Feeling bad or failure about yourself  0    ?Trouble concentrating 0    ?Moving slowly or fidgety/restless 0    ?Suicidal thoughts 0    ?PHQ-9 Score 1    ?  ? ?  10/01/2021  ?  1:30 PM 07/17/2020  ?  3:53 PM  ?GAD 7 : Generalized Anxiety Score  ?Nervous, Anxious, on Edge 0 0  ?Control/stop worrying 0 0  ?Worry too much - different things 0 0  ?Trouble relaxing 0 0  ?Restless 0 0  ?Easily annoyed or irritable 0 0  ?Afraid - awful might happen 0 0  ?Total GAD 7 Score 0 0  ? ?  ? Upstream - 10/01/21 1330   ? ?  ? Pregnancy Intention Screening  ? Does the patient want to become pregnant in the next year? No   ? Does the patient's partner want to become pregnant in the next year? No   ? Would the patient like to discuss contraceptive options today? No   ?  ? Contraception Wrap Up  ? Current Method Vasectomy   ? End Method Vasectomy   ? Contraception Counseling Provided No   ? ?  ?  ? ?  ? Examination chaperoned by 12/01/21 LPN ? ? ?Impression and Plan: ?1. Routine cervical smear ?Pap sent ?Physical with PCP ?Pap in 1 year if desired, but  every 3 is good ? ? ?2. Encounter for screening fecal occult blood testing ? ? ?3. Pelvic relaxation ?Will just watch for now  ? ? ? ?  ?  ?

## 2021-10-01 NOTE — Assessment & Plan Note (Addendum)
Occasional ?Has xanax 0.25 mg daily prn but has not taken ?

## 2021-10-07 LAB — CYTOLOGY - PAP
Comment: NEGATIVE
Diagnosis: NEGATIVE
Diagnosis: REACTIVE
High risk HPV: NEGATIVE

## 2021-10-21 IMAGING — CT CT ABD-PELV W/ CM
2 of 4 series · 15 of 46 positions shown, 17 images · IV contrast (OMNIPAQUE 300)
Comparison: 10/22/2008

CLINICAL DATA: Three years of intermittent right upper quadrant
pain, now with 3 week history of new diarrhea and 1 episode of
vomiting and epigastric pain

EXAM:
CT ABDOMEN AND PELVIS WITH CONTRAST
TECHNIQUE: Multidetector CT imaging of the abdomen and pelvis was performed
using the standard protocol following bolus administration of
intravenous contrast.
CONTRAST:  100mL OMNIPAQUE IOHEXOL 300 MG/ML  SOLN

[Series 2: abd/pel w · axial · 0.62mm/px · z∈[-414,-64]mm · 12 of 81 slices shown, 14 images]
[im 7/81  soft-tissue]
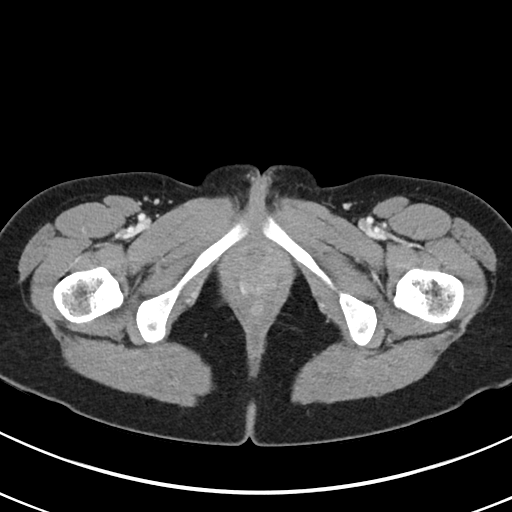
[im 7/81  bone]
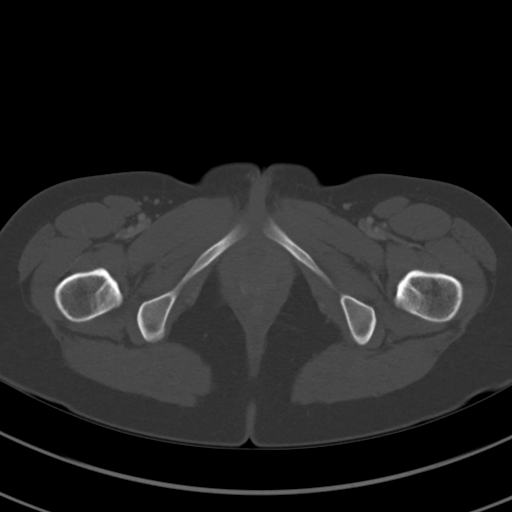
[im 13/81  soft-tissue]
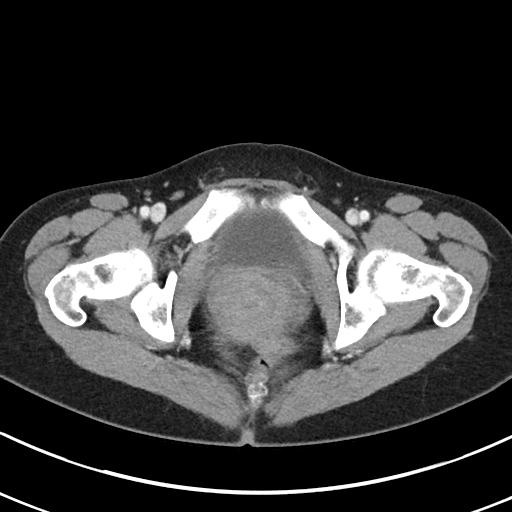
[im 20/81  soft-tissue]
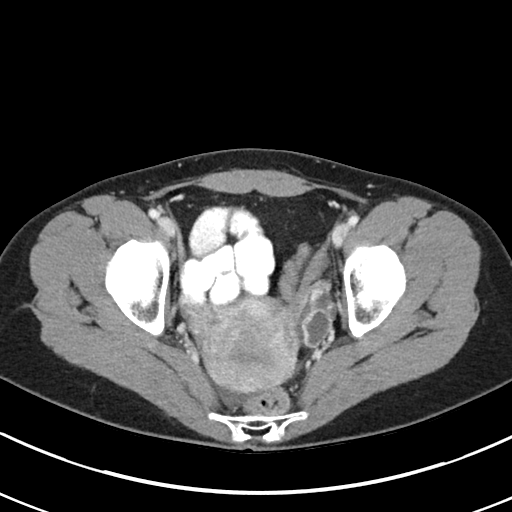
[im 26/81  soft-tissue]
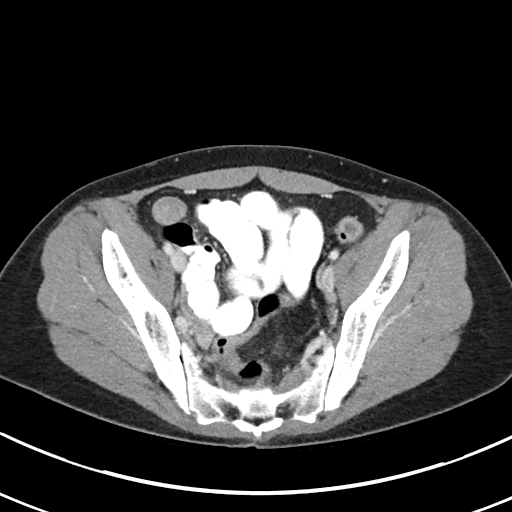
[im 33/81  soft-tissue]
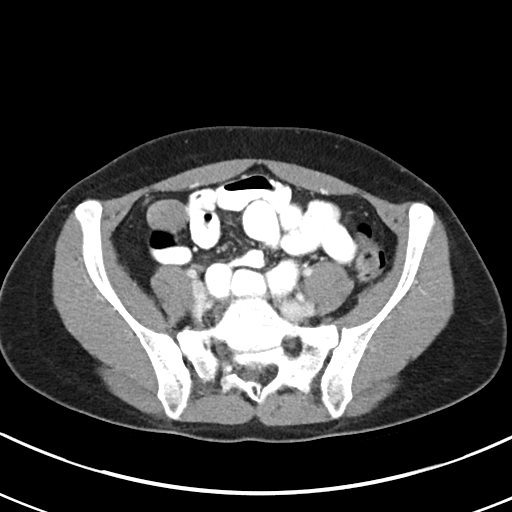
[im 39/81  soft-tissue]
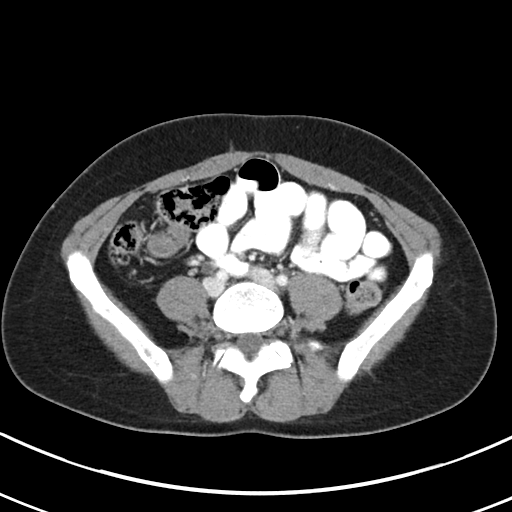
[im 45/81  soft-tissue]
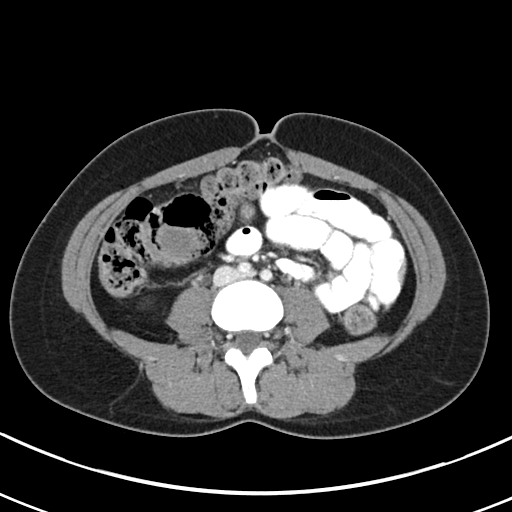
[im 52/81  soft-tissue]
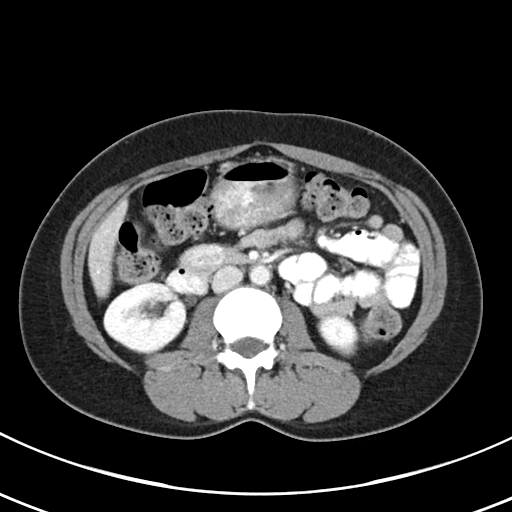
[im 58/81  soft-tissue]
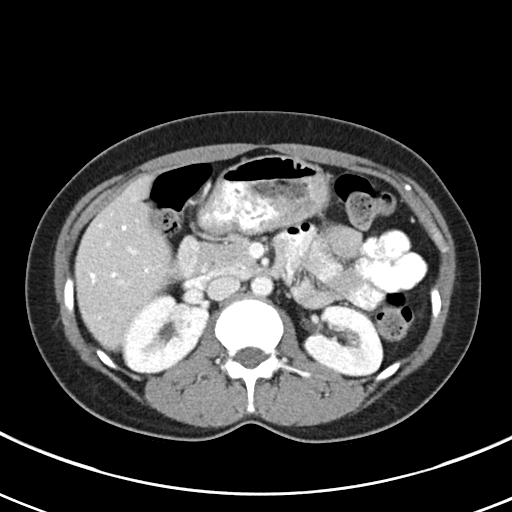
[im 58/81  bone]
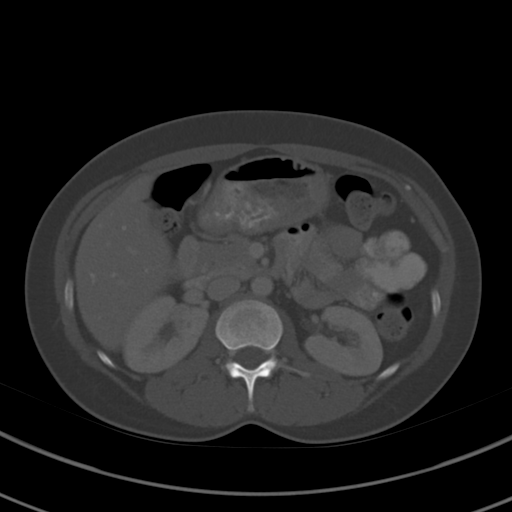
[im 65/81  soft-tissue]
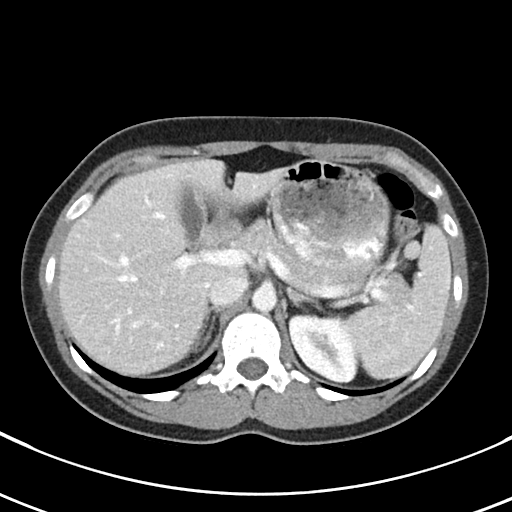
[im 71/81  soft-tissue]
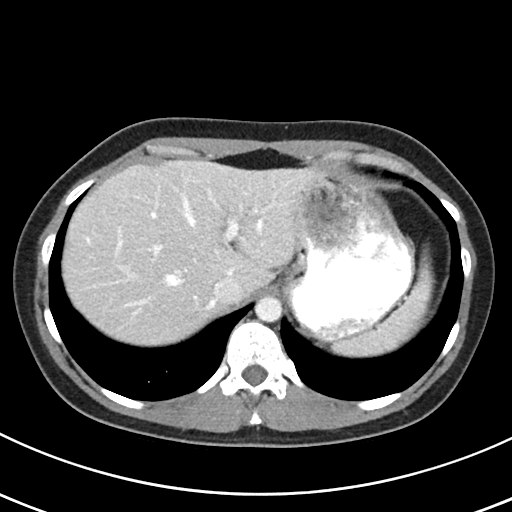
[im 77/81  soft-tissue]
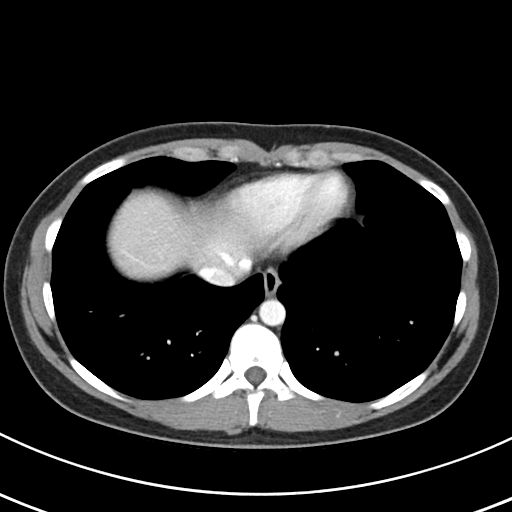

[Series 5: abd/pel w st · coronal · 0.58mm/px · 3 of 65 slices shown]
[im 22/65  soft-tissue]
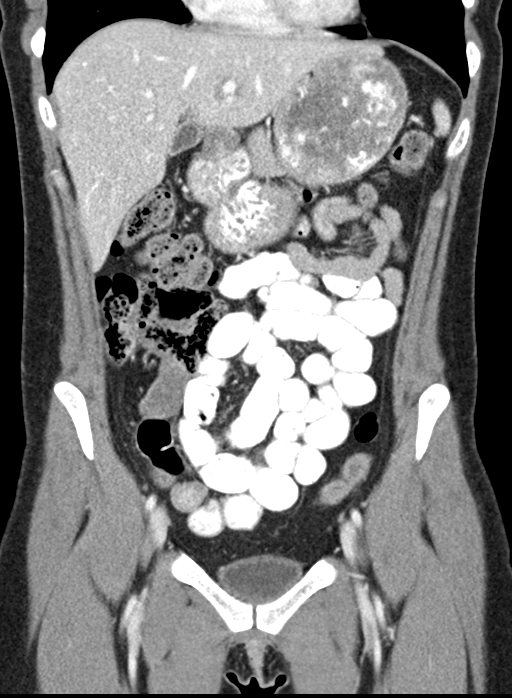
[im 29/65  soft-tissue]
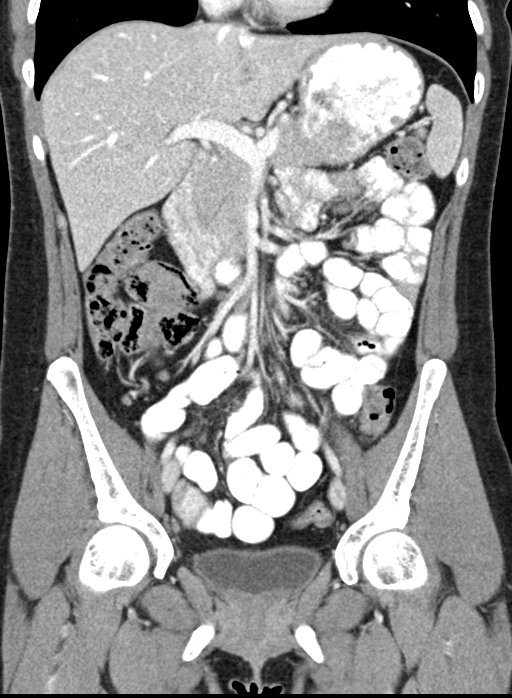
[im 36/65  soft-tissue]
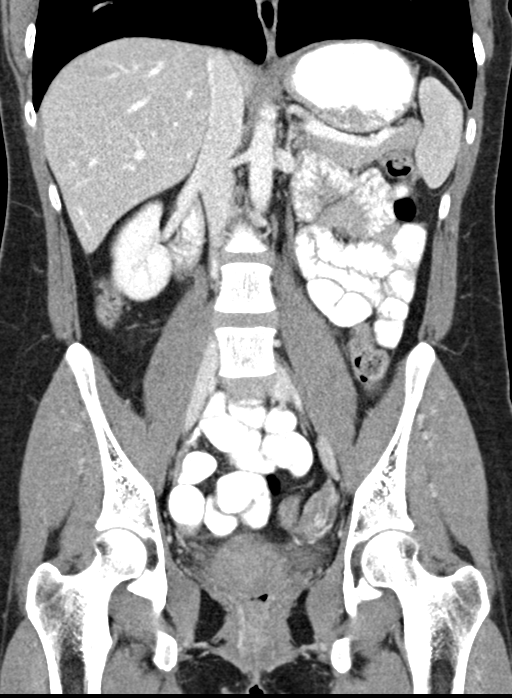

[15 of 46 positions shown; findings below may reference images not displayed]

FINDINGS: Lower chest: No signs of consolidation or evidence of pleural
effusion.

Hepatobiliary: Normal appearance of liver and gallbladder. No sign
of biliary ductal dilation. Portal vein is widely patent into the
liver. Hepatic veins are patent.

Pancreas: Pancreas without focal lesion or peripancreatic
inflammation.

Spleen: Spleen is normal.

Adrenals/Urinary Tract: 1.5 cm left adrenal nodule not clearly
present on the previous exam measuring 82 Hounsfield units with mild
heterogeneity and with no additional lesion in the left adrenal. No
signs of right adrenal lesion.

Renal contours are smooth without focal lesion. No signs of
hydronephrosis. No visible nephrolithiasis, distal ureters difficult
to follow into the pelvis.

Stomach/Bowel: Gastric distension full of ingested contrast and
ingested material no signs of duodenal dilation. Enteric contrast
passes into the ileum.

No signs of bowel obstruction.

Low-density material in the distal ileum and ileocecal valve may
reflect liquid stool or sucus. However, there is a question of some
ileal thickening. No hyperenhancement.

Appendix is normal.

Vascular/Lymphatic: Vascular structures in the abdomen are patent
including the SMV and SMA and splenic portal confluence. No signs of
adenopathy in the retroperitoneum or in the upper abdomen.

No signs of pelvic adenopathy.

Reproductive: Nonspecific heterogeneity of the uterus. Corpus luteum
cyst in left adnexa. No signs of adnexal mass.

Other: No signs of free air. No peritoneal nodularity. No ascites
aside from small volume fluid in the pelvis likely physiologic.

Musculoskeletal: No acute bone finding or destructive bone process.
IMPRESSION: 1. No acute findings in the abdomen or pelvis.
2. Question of ileal thickening and low-density material in the
cecum. This may reflect a mixture of liquid and formed bowel
contents. However, would consider colonoscopic evaluation given
patient's symptoms and above findings. Alternatively, CT
enterography and or colonography could be performed as a means of
evaluating these findings.
3. Signs of left adrenal lesion not clearly present on prior study
may represent a small adrenal adenoma. Biochemical correlation may
be useful. Follow-up adrenal protocol CT or MRI is suggested.

## 2022-02-05 NOTE — Progress Notes (Signed)
Subjective:    Patient ID: Mindy Bright, female    DOB: 10/03/1980, 41 y.o.   MRN: [id number]      HPI Mindy Bright is here for  Chief Complaint  Patient presents with   Follow-up    Nail discoloration x 2 weeks (wants to know if she has vitamin deficiency)    Temporomandibular Joint Pain     Nail discoloration - all nails - always has finger polish on -noticed it when she took her polish off -  pink area is two different shades. She does not eat a well balanced diet - barely eats any veges/fruits.  She denies any new or concerning symptoms   TMJ  on R - it started one week ago.  she has not been able to eat much the past 3 days.  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 Wears a bite guard - it was just adjusted about a month ago by her dentist and she wonders if that is part of the problem.     Eye issues - she has an eye exam this afternoon.  Her eyes are burning and she has gunk in her eyes in the morning and then it goes away.       Medications and allergies reviewed with patient and updated if appropriate.  Current Outpatient Medications on File Prior to Visit  Medication Sig Dispense Refill   ALPRAZolam (XANAX) 0.25 MG tablet Take 1 tablet (0.25 mg total) by mouth daily as needed for anxiety. 30 tablet 0   famotidine (PEPCID) 20 MG tablet TAKE 1 TABLET(20 MG) BY MOUTH TWICE DAILY 180 tablet 3   levothyroxine (SYNTHROID) 50 MCG tablet TAKE 1 TABLET BY MOUTH EVERY DAY BEFORE BREAKFAST 90 tablet 3   No current facility-administered medications on file prior to visit.    Review of Systems  Constitutional:  Negative for fever.  Respiratory:  Negative for cough, shortness of breath and wheezing.   Cardiovascular:  Negative for chest pain, palpitations and leg swelling.  Gastrointestinal:  Positive for constipation. Negative for abdominal pain, diarrhea and nausea.  Skin:        Nail color  change       Objective:   Vitals:   02/06/22 0925  BP: 102/70  Pulse: 64  Temp: 98 F (36.7 C)  SpO2: 99%   BP Readings from Last 3 Encounters:  02/06/22 102/70  10/01/21 113/77  10/01/21 102/68   Wt Readings from Last 3 Encounters:  02/06/22 116 lb (52.6 kg)  10/01/21 115 lb (52.2 kg)  10/01/21 113 lb 12.8 oz (51.6 kg)   Body mass index is 21.22 kg/m.    Physical Exam Constitutional:      General: She is not in acute distress.    Appearance: Normal appearance. She is not ill-appearing.  HENT:     Head: Normocephalic and atraumatic.  Eyes:     General: No scleral icterus.       Right eye: No discharge.        Left eye: No discharge.     Conjunctiva/sclera: Conjunctivae normal.  Musculoskeletal:     Cervical back: Neck supple. No tenderness (no tenderness in b/l TMJ's).  Lymphadenopathy:     Cervical: No cervical adenopathy.  Skin:    General: Skin is warm and dry.     Findings: No rash.     Comments: All fingernails - pink area is split - darker  pink near white of nail and lighter pink proximally.  No ridges  Neurological:     Mental Status: She is alert.            Assessment & Plan:    See Problem List for Assessment and Plan of chronic medical problems.

## 2022-02-06 ENCOUNTER — Ambulatory Visit: Payer: BC Managed Care – PPO | Admitting: Internal Medicine

## 2022-02-06 ENCOUNTER — Encounter: Payer: Self-pay | Admitting: Internal Medicine

## 2022-02-06 VITALS — BP 102/70 | HR 64 | Temp 98.0°F | Ht 62.0 in | Wt 116.0 lb

## 2022-02-06 DIAGNOSIS — L608 Other nail disorders: Secondary | ICD-10-CM

## 2022-02-06 DIAGNOSIS — R7303 Prediabetes: Secondary | ICD-10-CM

## 2022-02-06 DIAGNOSIS — H5789 Other specified disorders of eye and adnexa: Secondary | ICD-10-CM | POA: Diagnosis not present

## 2022-02-06 DIAGNOSIS — M26621 Arthralgia of right temporomandibular joint: Secondary | ICD-10-CM

## 2022-02-06 DIAGNOSIS — E039 Hypothyroidism, unspecified: Secondary | ICD-10-CM

## 2022-02-06 LAB — COMPREHENSIVE METABOLIC PANEL
ALT: 12 U/L (ref 0–35)
AST: 14 U/L (ref 0–37)
Albumin: 4.5 g/dL (ref 3.5–5.2)
Alkaline Phosphatase: 67 U/L (ref 39–117)
BUN: 19 mg/dL (ref 6–23)
CO2: 28 mEq/L (ref 19–32)
Calcium: 10.1 mg/dL (ref 8.4–10.5)
Chloride: 103 mEq/L (ref 96–112)
Creatinine, Ser: 0.85 mg/dL (ref 0.40–1.20)
GFR: 85.25 mL/min (ref >60.00)
Glucose, Bld: 81 mg/dL (ref 70–99)
Potassium: 4.3 mEq/L (ref 3.5–5.1)
Sodium: 137 mEq/L (ref 135–145)
Total Bilirubin: 0.8 mg/dL (ref 0.2–1.2)
Total Protein: 7.7 g/dL (ref 6.0–8.3)

## 2022-02-06 LAB — VITAMIN B12: Vitamin B-12: 517 pg/mL (ref 211–911)

## 2022-02-06 LAB — CBC WITH DIFFERENTIAL/PLATELET
Basophils Absolute: 0.1 10*3/uL (ref 0.0–0.1)
Basophils Relative: 0.8 % (ref 0.0–3.0)
Eosinophils Absolute: 0.3 10*3/uL (ref 0.0–0.7)
Eosinophils Relative: 3.8 % (ref 0.0–5.0)
HCT: 40.6 % (ref 36.0–46.0)
Hemoglobin: 13.5 g/dL (ref 12.0–15.0)
Lymphocytes Relative: 27.5 % (ref 12.0–46.0)
Lymphs Abs: 2.4 10*3/uL (ref 0.7–4.0)
MCHC: 33.3 g/dL (ref 30.0–36.0)
MCV: 86.4 fl (ref 78.0–100.0)
Monocytes Absolute: 0.6 10*3/uL (ref 0.1–1.0)
Monocytes Relative: 6.4 % (ref 3.0–12.0)
Neutro Abs: 5.4 10*3/uL (ref 1.4–7.7)
Neutrophils Relative %: 61.5 % (ref 43.0–77.0)
Platelets: 310 10*3/uL (ref 150.0–400.0)
RBC: 4.7 Mil/uL (ref 3.87–5.11)
RDW: 13.3 % (ref 11.5–15.5)
WBC: 8.8 10*3/uL (ref 4.0–10.5)

## 2022-02-06 LAB — FOLATE: Folate: >24.2 ng/mL (ref >5.9)

## 2022-02-06 LAB — HEMOGLOBIN A1C: Hgb A1c MFr Bld: 5.9 % (ref 4.6–6.5)

## 2022-02-06 LAB — TSH: TSH: 3.72 u[IU]/mL (ref 0.35–5.50)

## 2022-02-06 NOTE — Assessment & Plan Note (Addendum)
Acute All fingernails ? Cause Possibly related to having nail polish on them continuously or aging Vitamin def possible - she never eats fruits/veges Other more serious disease- has no concerning symptoms Ck cbc, cmp, vit B12, folate Start MVI +/- vege/fruit supplement

## 2022-02-06 NOTE — Assessment & Plan Note (Signed)
Acute No evidence of infection Has appt with eye doctor today - ? Dry eyes

## 2022-02-06 NOTE — Assessment & Plan Note (Signed)
Chronic Unlikely the cause of her nails Check a1c Low sugar / carb diet Stressed regular exercise

## 2022-02-06 NOTE — Assessment & Plan Note (Signed)
Acute R sided TMJ pain x 1 week  ? From adjustment in mouth guard by dentist Will f/u with dentist or oral surgeon Take ibuprofen, can try voltaren gel, heat / ice

## 2022-02-06 NOTE — Assessment & Plan Note (Signed)
Chronic  Clinically euthyroid Unlikely caused the nail issues Currently taking levothyroxine daily Check tsh  Titrate med dose if needed

## 2022-02-06 NOTE — Patient Instructions (Signed)
     Blood work was ordered.      Medications changes include :   none    Follow up with your dentist for your TMJ.

## 2022-03-31 ENCOUNTER — Encounter: Payer: Self-pay | Admitting: Adult Health

## 2022-03-31 ENCOUNTER — Ambulatory Visit: Payer: BC Managed Care – PPO | Admitting: Adult Health

## 2022-03-31 VITALS — BP 113/78 | HR 76 | Ht 62.0 in | Wt 118.5 lb

## 2022-03-31 DIAGNOSIS — N816 Rectocele: Secondary | ICD-10-CM | POA: Insufficient documentation

## 2022-03-31 DIAGNOSIS — N812 Incomplete uterovaginal prolapse: Secondary | ICD-10-CM

## 2022-03-31 DIAGNOSIS — K5900 Constipation, unspecified: Secondary | ICD-10-CM | POA: Diagnosis not present

## 2022-03-31 DIAGNOSIS — N8189 Other female genital prolapse: Secondary | ICD-10-CM | POA: Diagnosis not present

## 2022-03-31 NOTE — Progress Notes (Signed)
  Subjective:     Patient ID: Mindy Bright, female   DOB: 25-Nov-1980, 41 y.o.   MRN: [id number]  HPI Mindy Bright is a 41 year old white female,married, G2P2, in complaining of can feel cervix. Had sharp pain for a minute Friday and then felt like something in vagina. She does have pelvic relaxation. She has issues with constipation too.   Last pap was 10/01/21 and negative malignancy and HPV.  PCP is Dr Quay Burow  Review of Systems Feels like cervix dropped +constipation Reviewed past medical,surgical, social and family history. Reviewed medications and allergies.     Objective:   Physical Exam BP 113/78 (BP Location: Left Arm, Patient Position: Sitting, Cuff Size: Normal)   Pulse 76   Ht 5\' 2"  (1.575 m)   Wt 118 lb 8 oz (53.8 kg)   LMP 02/28/2022   BMI 21.67 kg/m     Skin warm and dry.Pelvic: external genitalia is normal in appearance no lesions, vagina: pink and moist,urethra has no lesions or masses noted, cervix:smooth and bulbous, +first degree uterine descensus,uterus: normal size, shape and contour, non tender, no masses felt, adnexa: no masses or tenderness noted. Bladder is non tender and no masses felt. On rectal exam has mild low rectocele.   Upstream - 03/31/22 1123       Pregnancy Intention Screening   Does the patient want to become pregnant in the next year? No    Does the patient's partner want to become pregnant in the next year? No    Would the patient like to discuss contraceptive options today? No      Contraception Wrap Up   Current Method Vasectomy    End Method Vasectomy            Examination chaperoned by Levy Pupa LPN  Assessment:     1. Pelvic relaxation She Googled it  Given medical explainer #4   2. First degree uterine prolapse Try kegels and see how feel after period Can refer for pelvic floor PT if desired Also discussed pessary   3. Rectocele  4. Constipation, unspecified constipation type Try glass of white grape juice and water  in am    Plan:     Follow up prn

## 2022-05-19 ENCOUNTER — Ambulatory Visit: Payer: BC Managed Care – PPO | Admitting: Internal Medicine

## 2022-05-19 ENCOUNTER — Encounter: Payer: Self-pay | Admitting: Internal Medicine

## 2022-05-19 NOTE — Progress Notes (Unsigned)
    Subjective:    Patient ID: Mindy Bright, female    DOB: 04-Dec-1980, 41 y.o.   MRN: [id number]      HPI Jamilee is here for No chief complaint on file.   Dry mouth, frequent urination -      Medications and allergies reviewed with patient and updated if appropriate.  Current Outpatient Medications on File Prior to Visit  Medication Sig Dispense Refill   ALPRAZolam (XANAX) 0.25 MG tablet Take 1 tablet (0.25 mg total) by mouth daily as needed for anxiety. (Patient not taking: Reported on 03/31/2022) 30 tablet 0   famotidine (PEPCID) 20 MG tablet TAKE 1 TABLET(20 MG) BY MOUTH TWICE DAILY 180 tablet 3   levothyroxine (SYNTHROID) 50 MCG tablet TAKE 1 TABLET BY MOUTH EVERY DAY BEFORE BREAKFAST 90 tablet 3   Multiple Vitamin (MULTIVITAMIN) tablet Take 1 tablet by mouth daily.     No current facility-administered medications on file prior to visit.    Review of Systems     Objective:  There were no vitals filed for this visit. BP Readings from Last 3 Encounters:  03/31/22 113/78  02/06/22 102/70  10/01/21 113/77   Wt Readings from Last 3 Encounters:  03/31/22 118 lb 8 oz (53.8 kg)  02/06/22 116 lb (52.6 kg)  10/01/21 115 lb (52.2 kg)   There is no height or weight on file to calculate BMI.    Physical Exam         Assessment & Plan:    See Problem List for Assessment and Plan of chronic medical problems.

## 2022-05-20 ENCOUNTER — Ambulatory Visit: Payer: BC Managed Care – PPO | Admitting: Internal Medicine

## 2022-05-20 ENCOUNTER — Encounter: Payer: Self-pay | Admitting: Internal Medicine

## 2022-05-20 VITALS — BP 120/68 | HR 70 | Temp 97.9°F | Ht 62.0 in | Wt 117.0 lb

## 2022-05-20 DIAGNOSIS — K117 Disturbances of salivary secretion: Secondary | ICD-10-CM

## 2022-05-20 DIAGNOSIS — R631 Polydipsia: Secondary | ICD-10-CM | POA: Diagnosis not present

## 2022-05-20 NOTE — Assessment & Plan Note (Signed)
Acute Having increased thirst because of the dry feeling in the back of her throat and some related increased urination Unlikely related to sugar-she is a prediabetic but there has not been any changes in her lifestyle that would make her sugars go high enough to cause any symptoms.  Fasting sugar for the past 3 days has been in the 80s, so we will hold off on blood work Increased urination likely related to drinking more which is likely related to the feeling of a dry throat will try antihistamine and see if that helps If symptoms do not improve she will let me know

## 2022-05-20 NOTE — Assessment & Plan Note (Signed)
Acute Started about 2 weeks ago without obvious cause She does have some mucus in the back of her throat which could be the culprit-possible allergies No evidence of diabetes despite the increase in urination and thirst-sugar has been in the 80s the past 3 days Unlikely Sjogren's disease Trial of antihistamine daily for at least 3-4 weeks to see if that helps If no improvement she will let me know

## 2022-05-20 NOTE — Patient Instructions (Addendum)
     Medications changes include :   try taking an antihistamine daily for 3-4 weeks     Return if symptoms worsen or fail to improve.

## 2022-05-25 ENCOUNTER — Encounter: Payer: Self-pay | Admitting: Internal Medicine

## 2022-06-05 ENCOUNTER — Ambulatory Visit: Payer: BC Managed Care – PPO | Admitting: Nurse Practitioner

## 2022-06-05 ENCOUNTER — Other Ambulatory Visit: Payer: Self-pay | Admitting: Nurse Practitioner

## 2022-06-05 VITALS — BP 108/72 | HR 83 | Temp 98.3°F | Ht 62.0 in | Wt 121.2 lb

## 2022-06-05 DIAGNOSIS — K117 Disturbances of salivary secretion: Secondary | ICD-10-CM | POA: Diagnosis not present

## 2022-06-05 DIAGNOSIS — K219 Gastro-esophageal reflux disease without esophagitis: Secondary | ICD-10-CM | POA: Diagnosis not present

## 2022-06-05 DIAGNOSIS — E039 Hypothyroidism, unspecified: Secondary | ICD-10-CM

## 2022-06-05 LAB — POCT RAPID STREP A (OFFICE): Rapid Strep A Screen: NEGATIVE

## 2022-06-05 MED ORDER — PANTOPRAZOLE SODIUM 40 MG PO TBEC: 40.0000 mg | DELAYED_RELEASE_TABLET | Freq: Every day | ORAL | 1 refills | Status: DC

## 2022-06-05 NOTE — Progress Notes (Addendum)
Established Patient Office Visit  Subjective   Patient ID: Mindy Bright, female    DOB: 1980/08/28  Age: 41 y.o. MRN: [id number]  Chief Complaint  Patient presents with   dry mouth    Patient has today the above.  Reports symptoms initially started 6 weeks ago.  Was already evaluated by primary care provider and recommended to trial antihistamine.  Patient reports she tried antihistamine for 7 to 10 days but did not feel improvement so she discontinued it.  Reports that her mouth is not necessarily dry it is more of a sensation of a lump in her throat.  It is painless, she denies dysphagia.  Was exposed to strep throat when her daughter had this a few weeks ago.  Does have history of hiatal hernia and acid reflux.  Has been well-controlled on famotidine chronically.  Has been lifting and a bit more active as she is assisting with changing the floors in her home.  Reports normal bowel movements.  Has not changed diet nor has had other lifestyle changes.  Does report that she was told she has dry eye last time she saw her eye doctor.     ROS: See HPI    Objective:     BP 108/72   Pulse 83   Temp 98.3 F (36.8 C) (Oral)   Ht 5\' 2"  (1.575 m)   Wt 121 lb 4 oz (55 kg)   LMP 05/22/2022   SpO2 98%   BMI 22.18 kg/m  BP Readings from Last 3 Encounters:  06/05/22 108/72  05/20/22 120/68  03/31/22 113/78   Wt Readings from Last 3 Encounters:  06/05/22 121 lb 4 oz (55 kg)  05/20/22 117 lb (53.1 kg)  03/31/22 118 lb 8 oz (53.8 kg)      Physical Exam Vitals reviewed.  Constitutional:      General: She is not in acute distress.    Appearance: Normal appearance.  HENT:     Head: Normocephalic and atraumatic.  Neck:     Vascular: No carotid bruit.  Cardiovascular:     Rate and Rhythm: Normal rate and regular rhythm.     Pulses: Normal pulses.     Heart sounds: Normal heart sounds.  Pulmonary:     Effort: Pulmonary effort is normal.     Breath sounds: Normal breath  sounds.  Abdominal:     General: Abdomen is flat. There is no distension.     Tenderness: There is no abdominal tenderness.  Skin:    General: Skin is warm and dry.  Neurological:     General: No focal deficit present.     Mental Status: She is alert and oriented to person, place, and time.  Psychiatric:        Mood and Affect: Mood normal.        Behavior: Behavior normal.        Judgment: Judgment normal.      Results for orders placed or performed in visit on 06/05/22  POCT rapid strep A  Result Value Ref Range   Rapid Strep A Screen Negative Negative       The 10-year ASCVD risk score (Arnett DK, et al., 2019) is: 0.4%    Assessment & Plan:   Problem List Items Addressed This Visit       Digestive   GERD (gastroesophageal reflux disease)    Chronic, will trial addition of pantoprazole to patient's daily famotidine.  Pantoprazole 40 mg tablet sent to patient's pharmacy,  patient will take 1 tablet by mouth daily.  Patient also encouraged to follow-up with GI to determine if hiatal hernia may be playing a role in her symptoms.  Patient reports understanding.      Relevant Medications   pantoprazole (PROTONIX) 40 MG tablet   Other Relevant Orders   POCT rapid strep A (Completed)   Xerostomia - Primary    Strep negative.  Symptoms have been persisting for about 6 weeks.  Will check labs including antibodies for Sjgren's syndrome, CBC, CMP, urinalysis, and thyroid panel for further evaluation.  For now trial pantoprazole in addition to famotidine in case this is being caused by GERD.  Patient to follow-up in 1 month for close monitoring.      Relevant Orders   Sjogrens syndrome-A extractable nuclear antibody   Sjogrens syndrome-B extractable nuclear antibody   Antinuclear Antib (ANA)   CBC (Completed)   Urinalysis, Routine w reflex microscopic   TSH   T3, free   T4, free   Basic metabolic panel     Endocrine   Hypothyroidism    Chronic, patient to continue  levothyroxine 50 mcg mouth daily.  Will check thyroid panel to see if this may be associated with current symptoms.      Relevant Orders   TSH   T3, free   T4, free    Return in about 1 month (around 07/06/2022).  Total time spent on encounter today was 33 minutes.  This included face-to-face interaction with patient, review of previous records, and discussion and development of treatment plan.   Elenore Paddy, NP

## 2022-06-05 NOTE — Assessment & Plan Note (Signed)
Chronic, will trial addition of pantoprazole to patient's daily famotidine.  Pantoprazole 40 mg tablet sent to patient's pharmacy, patient will take 1 tablet by mouth daily.  Patient also encouraged to follow-up with GI to determine if hiatal hernia may be playing a role in her symptoms.  Patient reports understanding.

## 2022-06-05 NOTE — Assessment & Plan Note (Signed)
Chronic, patient to continue levothyroxine 50 mcg mouth daily.  Will check thyroid panel to see if this may be associated with current symptoms.

## 2022-06-05 NOTE — Assessment & Plan Note (Signed)
Strep negative.  Symptoms have been persisting for about 6 weeks.  Will check labs including antibodies for shortens, CBC, CMP, urinalysis, and thyroid panel for further evaluation.  For now trial pantoprazole in addition to famotidine in case this is being caused by GERD.  Patient to follow-up in 1 month for close monitoring.

## 2022-06-08 ENCOUNTER — Other Ambulatory Visit (INDEPENDENT_AMBULATORY_CARE_PROVIDER_SITE_OTHER): Payer: BC Managed Care – PPO

## 2022-06-08 DIAGNOSIS — K117 Disturbances of salivary secretion: Secondary | ICD-10-CM | POA: Diagnosis not present

## 2022-06-08 DIAGNOSIS — E039 Hypothyroidism, unspecified: Secondary | ICD-10-CM

## 2022-06-08 LAB — CBC
HCT: 38.3 % (ref 36.0–46.0)
Hemoglobin: 12.9 g/dL (ref 12.0–15.0)
MCHC: 33.8 g/dL (ref 30.0–36.0)
MCV: 85.7 fl (ref 78.0–100.0)
Platelets: 349 10*3/uL (ref 150.0–400.0)
RBC: 4.47 Mil/uL (ref 3.87–5.11)
RDW: 13 % (ref 11.5–15.5)
WBC: 14.6 10*3/uL — ABNORMAL HIGH (ref 4.0–10.5)

## 2022-06-09 LAB — BASIC METABOLIC PANEL
BUN: 17 mg/dL (ref 6–23)
CO2: 27 mEq/L (ref 19–32)
Calcium: 9.4 mg/dL (ref 8.4–10.5)
Chloride: 101 mEq/L (ref 96–112)
Creatinine, Ser: 0.85 mg/dL (ref 0.40–1.20)
GFR: 85.05 mL/min (ref >60.00)
Glucose, Bld: 105 mg/dL — ABNORMAL HIGH (ref 70–99)
Potassium: 4 mEq/L (ref 3.5–5.1)
Sodium: 136 mEq/L (ref 135–145)

## 2022-06-09 LAB — TSH: TSH: 3.58 u[IU]/mL (ref 0.35–5.50)

## 2022-06-09 LAB — T4, FREE: Free T4: 0.63 ng/dL (ref 0.60–1.60)

## 2022-06-09 LAB — T3, FREE: T3, Free: 3.3 pg/mL (ref 2.3–4.2)

## 2022-06-10 ENCOUNTER — Ambulatory Visit: Payer: BC Managed Care – PPO | Admitting: Gastroenterology

## 2022-06-10 ENCOUNTER — Encounter: Payer: Self-pay | Admitting: Gastroenterology

## 2022-06-10 VITALS — BP 108/64 | HR 80 | Ht 62.0 in | Wt 120.1 lb

## 2022-06-10 DIAGNOSIS — R09A2 Foreign body sensation, throat: Secondary | ICD-10-CM | POA: Diagnosis not present

## 2022-06-10 DIAGNOSIS — K219 Gastro-esophageal reflux disease without esophagitis: Secondary | ICD-10-CM

## 2022-06-10 DIAGNOSIS — R142 Eructation: Secondary | ICD-10-CM

## 2022-06-10 LAB — SJOGRENS SYNDROME-B EXTRACTABLE NUCLEAR ANTIBODY: SSB (La) (ENA) Antibody, IgG: <1 AI

## 2022-06-10 LAB — URINALYSIS, ROUTINE W REFLEX MICROSCOPIC
Bilirubin Urine: NEGATIVE
Glucose, UA: NEGATIVE
Hgb urine dipstick: NEGATIVE
Ketones, ur: NEGATIVE
Leukocytes,Ua: NEGATIVE
Nitrite: NEGATIVE
Protein, ur: NEGATIVE
Specific Gravity, Urine: 1.004 (ref 1.001–1.035)
pH: 6 (ref 5.0–8.0)

## 2022-06-10 LAB — ANA: Anti Nuclear Antibody (ANA): NEGATIVE

## 2022-06-10 LAB — SJOGRENS SYNDROME-A EXTRACTABLE NUCLEAR ANTIBODY: SSA (Ro) (ENA) Antibody, IgG: <1 AI

## 2022-06-10 MED ORDER — PANTOPRAZOLE SODIUM 40 MG PO TBEC: 40.0000 mg | DELAYED_RELEASE_TABLET | Freq: Two times a day (BID) | ORAL | 2 refills | Status: DC

## 2022-06-10 NOTE — Progress Notes (Signed)
06/10/2022 Mindy Bright [id number] Jan 17, 1981   HISTORY OF PRESENT ILLNESS:  This is a 41 year old female who is a patient of Dr. Lamar Sprinkles.  She is here today with complaints of dry throat (not mouth), increased belching, and globus sensation for about 1.5 months.  She had only been on Pepcid 20 mg daily.  Was just put on pantoprazole 40 mg daily less than a week ago.  Has had some improvement, but has also been using ibuprofen.  Had been on pantoprazole in the past, but not for couple of years.  Her PCP is also checking her for Sjogren's.  Allergy meds have not helped.   Past Medical History:  Diagnosis Date   Abnormal Pap smear    Anemia    Anxiety    Gall bladder disease    GERD (gastroesophageal reflux disease)    H/O hiatal hernia    Irregular periods 04/30/2014   Patient desires pregnancy 12/11/2013   Pregnant 10/26/2014   Reflux 11/09/2014   Thyroid disease    Unspecified symptom associated with female genital organs 12/11/2013   Vaginal discharge 12/11/2013   Vaginal Pap smear, abnormal    >10 years ago, no treatment   Past Surgical History:  Procedure Laterality Date   COLONOSCOPY  07/2019   UPPER GI ENDOSCOPY  07/2019   WISDOM TOOTH EXTRACTION      reports that she quit smoking about 19 years ago. Her smoking use included cigarettes. She has a 0.75 pack-year smoking history. She has never used smokeless tobacco. She reports that she does not drink alcohol and does not use drugs. family history includes Diabetes in her maternal aunt, maternal grandmother, mother, and paternal grandfather; Gout in her brother; Heart disease in her maternal grandfather and paternal grandfather; Hypertension in her father. Allergies  Allergen Reactions   Penicillins Swelling    Has patient had a PCN reaction causing immediate rash, facial/tongue/throat swelling, SOB or lightheadedness with hypotension: Yes Has patient had a PCN reaction causing severe rash involving mucus membranes or  skin necrosis: No Has patient had a PCN reaction that required hospitalization Yes Has patient had a PCN reaction occurring within the last 10 years: No If all of the above answers are "NO", then may proceed with Cephalosporin use.      Outpatient Encounter Medications as of 06/10/2022  Medication Sig   ALPRAZolam (XANAX) 0.25 MG tablet Take 1 tablet (0.25 mg total) by mouth daily as needed for anxiety.   famotidine (PEPCID) 20 MG tablet TAKE 1 TABLET(20 MG) BY MOUTH TWICE DAILY   levothyroxine (SYNTHROID) 50 MCG tablet TAKE 1 TABLET BY MOUTH EVERY DAY BEFORE BREAKFAST   Multiple Vitamin (MULTIVITAMIN) tablet Take 1 tablet by mouth daily.   pantoprazole (PROTONIX) 40 MG tablet Take 1 tablet (40 mg total) by mouth daily.   No facility-administered encounter medications on file as of 06/10/2022.     REVIEW OF SYSTEMS  : All other systems reviewed and negative except where noted in the History of Present Illness.   PHYSICAL EXAM: BP 108/64 (BP Location: Left Arm, Patient Position: Sitting, Cuff Size: Normal)   Pulse 80   Ht 5\' 2"  (1.575 m)   Wt 120 lb 2 oz (54.5 kg)   LMP 05/22/2022   SpO2 98%   BMI 21.97 kg/m  General: Well developed white female in no acute distress Head: Normocephalic and atraumatic Eyes:  Sclerae anicteric, conjunctiva pink. Ears: Normal auditory acuity Lungs: Clear throughout to auscultation; no W/R/R. Heart:  Regular rate and rhythm; no M/R/G. Abdomen: Soft, non-distended.  BS present.  Non-tender. Musculoskeletal: Symmetrical with no gross deformities  Skin: No lesions on visible extremities Extremities: No edema  Neurological: Alert oriented x 4, grossly non-focal Psychological:  Alert and cooperative. Normal mood and affect  ASSESSMENT AND PLAN: 41 year old female with complaints of dry throat (not mouth), increased belching, and globus sensation.  She had only been on Pepcid 20 mg daily.  Was just put on pantoprazole 40 mg daily less than a week  ago.  Has had some improvement, but has also been using ibuprofen.  Had been on pantoprazole in the past, but not for couple of years.  Suspect symptoms are due to reflux.  Her PCP is also checking her for Sjogren's.  We are going to increase her pantoprazole to twice daily and have her do the Pepcid at bedtime for about a month to see how she does.  New prescription for pantoprazole sent to the pharmacy.  If symptoms persist we can consider repeating EGD versus just doing an esophagram and may need repeat ENT evaluation.  She will message [place] with an update in about a month.   CC:  Pincus Sanes, MD

## 2022-06-10 NOTE — Progress Notes (Signed)
Noted  

## 2022-06-10 NOTE — Patient Instructions (Addendum)
_______________________________________________________  If you are age 41 or older, your body mass index should be between 23-30. Your Body mass index is 21.97 kg/m. If this is out of the aforementioned range listed, please consider follow up with your Primary Care Provider.  If you are age 75 or younger, your body mass index should be between 19-25. Your Body mass index is 21.97 kg/m. If this is out of the aformentioned range listed, please consider follow up with your Primary Care Provider.   We have sent the following medications to your pharmacy for you to pick up at your convenience: Pantoprazole 40 mg twice daily.  Continue Pepcid at bedtime.   Message [place] with updates in about one month.  The [facility] providers would like to encourage you to use [facility] to communicate with providers for non-urgent requests or questions.  Due to long hold times on the telephone, sending your provider a message by [facility] may be a faster and more efficient way to get a response.  Please allow 48 business hours for a response.  Please remember that this is for non-urgent requests.   It was a pleasure to see you today!  Thank you for trusting me with your gastrointestinal care!

## 2022-06-11 ENCOUNTER — Telehealth: Payer: Self-pay | Admitting: Radiology

## 2022-06-11 ENCOUNTER — Other Ambulatory Visit: Payer: Self-pay | Admitting: Nurse Practitioner

## 2022-06-11 DIAGNOSIS — D72829 Elevated white blood cell count, unspecified: Secondary | ICD-10-CM

## 2022-06-11 NOTE — Telephone Encounter (Signed)
Called pt.

## 2022-06-25 ENCOUNTER — Other Ambulatory Visit (INDEPENDENT_AMBULATORY_CARE_PROVIDER_SITE_OTHER): Payer: BC Managed Care – PPO

## 2022-06-25 DIAGNOSIS — D72829 Elevated white blood cell count, unspecified: Secondary | ICD-10-CM | POA: Diagnosis not present

## 2022-06-25 LAB — CBC WITH DIFFERENTIAL/PLATELET
Basophils Absolute: 0.1 10*3/uL (ref 0.0–0.1)
Basophils Relative: 0.5 % (ref 0.0–3.0)
Eosinophils Absolute: 0.5 10*3/uL (ref 0.0–0.7)
Eosinophils Relative: 4 % (ref 0.0–5.0)
HCT: 38.4 % (ref 36.0–46.0)
Hemoglobin: 12.9 g/dL (ref 12.0–15.0)
Lymphocytes Relative: 22.8 % (ref 12.0–46.0)
Lymphs Abs: 2.7 10*3/uL (ref 0.7–4.0)
MCHC: 33.7 g/dL (ref 30.0–36.0)
MCV: 84.8 fl (ref 78.0–100.0)
Monocytes Absolute: 0.7 10*3/uL (ref 0.1–1.0)
Monocytes Relative: 5.6 % (ref 3.0–12.0)
Neutro Abs: 7.9 10*3/uL — ABNORMAL HIGH (ref 1.4–7.7)
Neutrophils Relative %: 67.1 % (ref 43.0–77.0)
Platelets: 395 10*3/uL (ref 150.0–400.0)
RBC: 4.52 Mil/uL (ref 3.87–5.11)
RDW: 12.7 % (ref 11.5–15.5)
WBC: 11.8 10*3/uL — ABNORMAL HIGH (ref 4.0–10.5)

## 2022-07-02 ENCOUNTER — Other Ambulatory Visit: Payer: Self-pay | Admitting: Internal Medicine

## 2022-07-02 NOTE — Progress Notes (Signed)
Subjective:    Patient ID: Mindy Bright, female    DOB: 04/07/1981, 42 y.o.   MRN: [id number]     HPI Apryl is here for follow up of her dry mouth, gerd.   GERD, dry mouth - saw GI - likely GERD. Started on pantoprazole 40 mg bid -Globus sensation went away for a while.  Still has an intermittent globus sensation in throat.  Has appt with ENT next week.  She denies GERD, but does have increased belching.  Not sure if it is heartburn or not.  Dry mouth is not really an issue.  Her glands in her neck are sore.  She has soreness in her right side of neck that is intermittent.  The soreness in her neck is not Nestl worse with head movements.  She was unsure if it was muscular or not.  The ibuprofen seem to help.  She is not taking it now because she knows it will upset her stomach.  For a while she did have the swollen glands, but she was also sick around that time and wonders if it was related to that.  Mildly elevated WBC.     Medications and allergies reviewed with patient and updated if appropriate.  Current Outpatient Medications on File Prior to Visit  Medication Sig Dispense Refill   ALPRAZolam (XANAX) 0.25 MG tablet Take 1 tablet (0.25 mg total) by mouth daily as needed for anxiety. 30 tablet 0   levothyroxine (SYNTHROID) 50 MCG tablet TAKE 1 TABLET BY MOUTH EVERY DAY BEFORE BREAKFAST 90 tablet 3   Multiple Vitamin (MULTIVITAMIN) tablet Take 1 tablet by mouth daily.     pantoprazole (PROTONIX) 40 MG tablet Take 1 tablet (40 mg total) by mouth 2 (two) times daily. 60 tablet 2   No current facility-administered medications on file prior to visit.     Review of Systems  Constitutional:  Negative for chills and fever.  HENT:  Negative for sore throat and trouble swallowing.   Respiratory:  Negative for cough.   Gastrointestinal:  Negative for nausea.       Has some belching.   Denies gerd now.          Objective:   Vitals:   07/03/22 0802  BP: 100/70  Pulse:  81  Temp: 97.9 F (36.6 C)  SpO2: 99%   BP Readings from Last 3 Encounters:  07/03/22 100/70  06/10/22 108/64  06/05/22 108/72   Wt Readings from Last 3 Encounters:  07/03/22 119 lb (54 kg)  06/10/22 120 lb 2 oz (54.5 kg)  06/05/22 121 lb 4 oz (55 kg)   Body mass index is 21.77 kg/m.    Physical Exam Constitutional:      General: She is not in acute distress.    Appearance: Normal appearance. She is not ill-appearing.  HENT:     Head: Normocephalic and atraumatic.     Right Ear: Tympanic membrane, ear canal and external ear normal.     Left Ear: Tympanic membrane, ear canal and external ear normal.     Mouth/Throat:     Mouth: Mucous membranes are moist.     Pharynx: No oropharyngeal exudate or posterior oropharyngeal erythema.  Eyes:     Conjunctiva/sclera: Conjunctivae normal.  Cardiovascular:     Rate and Rhythm: Normal rate and regular rhythm.  Pulmonary:     Effort: Pulmonary effort is normal. No respiratory distress.     Breath sounds: Normal breath sounds. No wheezing  or rales.  Musculoskeletal:     Cervical back: Neck supple. No tenderness.  Lymphadenopathy:     Cervical: No cervical adenopathy.  Skin:    General: Skin is warm and dry.  Neurological:     Mental Status: She is alert.        Lab Results  Component Value Date   WBC 11.8 (H) 06/25/2022   HGB 12.9 06/25/2022   HCT 38.4 06/25/2022   PLT 395.0 06/25/2022   GLUCOSE 105 (H) 06/08/2022   CHOL 200 10/01/2021   TRIG 95.0 10/01/2021   HDL 55.50 10/01/2021   LDLCALC 125 (H) 10/01/2021   ALT 12 02/06/2022   AST 14 02/06/2022   NA 136 06/08/2022   K 4.0 06/08/2022   CL 101 06/08/2022   CREATININE 0.85 06/08/2022   BUN 17 06/08/2022   CO2 27 06/08/2022   TSH 3.58 06/08/2022   HGBA1C 5.9 02/06/2022     Assessment & Plan:    See Problem List for Assessment and Plan of chronic medical problems.

## 2022-07-03 ENCOUNTER — Ambulatory Visit: Payer: BC Managed Care – PPO | Admitting: Internal Medicine

## 2022-07-03 ENCOUNTER — Encounter: Payer: Self-pay | Admitting: Internal Medicine

## 2022-07-03 VITALS — BP 100/70 | HR 81 | Temp 97.9°F | Ht 62.0 in | Wt 119.0 lb

## 2022-07-03 DIAGNOSIS — D72829 Elevated white blood cell count, unspecified: Secondary | ICD-10-CM | POA: Diagnosis not present

## 2022-07-03 DIAGNOSIS — M542 Cervicalgia: Secondary | ICD-10-CM | POA: Diagnosis not present

## 2022-07-03 DIAGNOSIS — E039 Hypothyroidism, unspecified: Secondary | ICD-10-CM

## 2022-07-03 DIAGNOSIS — F418 Other specified anxiety disorders: Secondary | ICD-10-CM

## 2022-07-03 DIAGNOSIS — K219 Gastro-esophageal reflux disease without esophagitis: Secondary | ICD-10-CM | POA: Diagnosis not present

## 2022-07-03 LAB — CBC WITH DIFFERENTIAL/PLATELET
Basophils Absolute: 0.1 10*3/uL (ref 0.0–0.1)
Basophils Relative: 0.6 % (ref 0.0–3.0)
Eosinophils Absolute: 0.4 10*3/uL (ref 0.0–0.7)
Eosinophils Relative: 3.7 % (ref 0.0–5.0)
HCT: 38.8 % (ref 36.0–46.0)
Hemoglobin: 13.2 g/dL (ref 12.0–15.0)
Lymphocytes Relative: 22.6 % (ref 12.0–46.0)
Lymphs Abs: 2.4 10*3/uL (ref 0.7–4.0)
MCHC: 34 g/dL (ref 30.0–36.0)
MCV: 84.4 fl (ref 78.0–100.0)
Monocytes Absolute: 0.4 10*3/uL (ref 0.1–1.0)
Monocytes Relative: 4.1 % (ref 3.0–12.0)
Neutro Abs: 7.3 10*3/uL (ref 1.4–7.7)
Neutrophils Relative %: 69 % (ref 43.0–77.0)
Platelets: 388 10*3/uL (ref 150.0–400.0)
RBC: 4.59 Mil/uL (ref 3.87–5.11)
RDW: 13.1 % (ref 11.5–15.5)
WBC: 10.6 10*3/uL — ABNORMAL HIGH (ref 4.0–10.5)

## 2022-07-03 NOTE — Assessment & Plan Note (Signed)
Chronic Has always had some GERD ?  Uncontrolled recently causing globus sensation, dry mouth Currently on pantoprazole 40 mg daily Globus sensation better, but still has it at times, which could also be related to anxiety because she is very anxious about her symptoms Continue pantoprazole 40 mg twice daily for now Sees ENT next week and can reevaluate pantoprazole afterwards-May be able to decrease it down to once a day

## 2022-07-03 NOTE — Patient Instructions (Addendum)
      Blood work was ordered.   The lab is on the first floor.    Medications changes include :   none   

## 2022-07-03 NOTE — Assessment & Plan Note (Signed)
She states intermittent right-sided anterior neck pain that she thinks may be more muscular, but it is concerning to her with all of her other symptoms Exam is normal-no swollen lymph nodes or palpable lymph nodes, no tenderness along SCM on the right-no increased pain with movement of head Sees ENT next week so we will await their evaluation

## 2022-07-03 NOTE — Assessment & Plan Note (Signed)
Chronic Recent TSH within normal range Continue levothyroxine 50 mcg daily

## 2022-07-03 NOTE — Assessment & Plan Note (Signed)
Mild Likely reactive Will recheck CBC

## 2022-07-03 NOTE — Assessment & Plan Note (Signed)
Chronic Has situational anxiety She is more anxious now because of her current symptoms Continue alprazolam 0.25 mg daily as needed, but advised not to take on a regular basis

## 2022-07-10 ENCOUNTER — Encounter: Payer: Self-pay | Admitting: Internal Medicine

## 2022-07-14 ENCOUNTER — Ambulatory Visit: Payer: BC Managed Care – PPO | Admitting: Gastroenterology

## 2022-07-21 ENCOUNTER — Telehealth: Payer: Self-pay | Admitting: Gastroenterology

## 2022-07-21 DIAGNOSIS — K219 Gastro-esophageal reflux disease without esophagitis: Secondary | ICD-10-CM

## 2022-07-21 DIAGNOSIS — R09A2 Foreign body sensation, throat: Secondary | ICD-10-CM

## 2022-07-21 DIAGNOSIS — R142 Eructation: Secondary | ICD-10-CM

## 2022-07-21 NOTE — Telephone Encounter (Signed)
The pt has been rescheduled and instructions and referral have been entered

## 2022-07-21 NOTE — Telephone Encounter (Signed)
Patient rescheduled EGD with Dr. Henrene Pastor for 2/13 at 10:00 AM. Patient stated she was okay with receiving instructions in MyChart and through the mail and would call if she had any further questions. Thank you.

## 2022-07-22 ENCOUNTER — Ambulatory Visit: Payer: BC Managed Care – PPO

## 2022-07-28 DIAGNOSIS — J101 Influenza due to other identified influenza virus with other respiratory manifestations: Secondary | ICD-10-CM | POA: Diagnosis not present

## 2022-07-28 DIAGNOSIS — Z6821 Body mass index (BMI) 21.0-21.9, adult: Secondary | ICD-10-CM | POA: Diagnosis not present

## 2022-08-11 ENCOUNTER — Encounter: Payer: Self-pay | Admitting: Internal Medicine

## 2022-08-11 ENCOUNTER — Ambulatory Visit (AMBULATORY_SURGERY_CENTER): Payer: BC Managed Care – PPO | Admitting: Internal Medicine

## 2022-08-11 VITALS — BP 110/66 | HR 65 | Temp 98.4°F | Resp 18 | Ht 62.0 in | Wt 120.0 lb

## 2022-08-11 DIAGNOSIS — R09A2 Foreign body sensation, throat: Secondary | ICD-10-CM | POA: Diagnosis not present

## 2022-08-11 DIAGNOSIS — K219 Gastro-esophageal reflux disease without esophagitis: Secondary | ICD-10-CM | POA: Diagnosis not present

## 2022-08-11 MED ORDER — SODIUM CHLORIDE 0.9 % IV SOLN: 500.0000 mL | Freq: Once | INTRAVENOUS | Status: DC

## 2022-08-11 NOTE — Progress Notes (Signed)
Vss nad trans to pacu °

## 2022-08-11 NOTE — Op Note (Signed)
[employer] Patient Name: Mindy Bright Procedure Date: 08/11/2022 10:35 AM MRN: [id number] Endoscopist: Docia Chuck. Henrene Pastor , MD, [id number] Age: 42 Referring MD:  Date of Birth: 05-16-1981 Gender: Female Account #: [id number] Procedure:                Upper GI endoscopy Indications:              Dysphagia (globus sensation); GERD; dyspepsia                            (belching) Medicines:                Monitored Anesthesia Care Procedure:                Pre-Anesthesia Assessment:                           - Prior to the procedure, a History and Physical                            was performed, and patient medications and                            allergies were reviewed. The patient's tolerance of                            previous anesthesia was also reviewed. The risks                            and benefits of the procedure and the sedation                            options and risks were discussed with the patient.                            All questions were answered, and informed consent                            was obtained. Prior Anticoagulants: The patient has                            taken no anticoagulant or antiplatelet agents. ASA                            Grade Assessment: II - A patient with mild systemic                            disease. After reviewing the risks and benefits,                            the patient was deemed in satisfactory condition to                            undergo the procedure.  After obtaining informed consent, the endoscope was                            passed under direct vision. Throughout the                            procedure, the patient's blood pressure, pulse, and                            oxygen saturations were monitored continuously. The                            GIF [id number] was introduced through the                            mouth, and advanced to the second part of  duodenum.                            The upper GI endoscopy was accomplished without                            difficulty. The patient tolerated the procedure                            well. Scope In: Scope Out: Findings:                 The esophagus was normal.                           The stomach was normal. Small hiatal hernia.                           The examined duodenum was normal.                           The cardia and gastric fundus were normal on                            retroflexion. Complications:            No immediate complications. Estimated Blood Loss:     Estimated blood loss: none. Impression:               -Normal EGD                           -Globus sensation. Recommendation:           - Patient has a contact number available for                            emergencies. The signs and symptoms of potential                            delayed complications were discussed with the  patient. Return to normal activities tomorrow.                            Written discharge instructions were provided to the                            patient.                           - Resume previous diet.                           - Continue present medications. Docia Chuck. Henrene Pastor, MD 08/11/2022 10:53:41 AM This report has been signed electronically.

## 2022-08-11 NOTE — Progress Notes (Signed)
06/10/2022 CANDASE FRUM [id number] 14-Feb-1981     HISTORY OF PRESENT ILLNESS:  This is a 42 year old female who is a patient of Dr. Blanch Media.  She is here today with complaints of dry throat (not mouth), increased belching, and globus sensation for about 1.5 months.  She had only been on Pepcid 20 mg daily.  Was just put on pantoprazole 40 mg daily less than a week ago.  Has had some improvement, but has also been using ibuprofen.  Had been on pantoprazole in the past, but not for couple of years.  Her PCP is also checking her for Sjogren's.  Allergy meds have not helped.         Past Medical History:  Diagnosis Date   Abnormal Pap smear     Anemia     Anxiety     Gall bladder disease     GERD (gastroesophageal reflux disease)     H/O hiatal hernia     Irregular periods 04/30/2014   Patient desires pregnancy 12/11/2013   Pregnant 10/26/2014   Reflux 11/09/2014   Thyroid disease     Unspecified symptom associated with female genital organs 12/11/2013   Vaginal discharge 12/11/2013   Vaginal Pap smear, abnormal      >10 years ago, no treatment         Past Surgical History:  Procedure Laterality Date   COLONOSCOPY   07/2019   UPPER GI ENDOSCOPY   07/2019   WISDOM TOOTH EXTRACTION         reports that she quit smoking about 19 years ago. Her smoking use included cigarettes. She has a 0.75 pack-year smoking history. She has never used smokeless tobacco. She reports that she does not drink alcohol and does not use drugs. family history includes Diabetes in her maternal aunt, maternal grandmother, mother, and paternal grandfather; Gout in her brother; Heart disease in her maternal grandfather and paternal grandfather; Hypertension in her father.      Allergies  Allergen Reactions   Penicillins Swelling      Has patient had a PCN reaction causing immediate rash, facial/tongue/throat swelling, SOB or lightheadedness with hypotension: Yes Has patient had a PCN reaction causing severe rash  involving mucus membranes or skin necrosis: No Has patient had a PCN reaction that required hospitalization Yes Has patient had a PCN reaction occurring within the last 10 years: No If all of the above answers are "NO", then may proceed with Cephalosporin use.            Outpatient Encounter Medications as of 06/10/2022  Medication Sig   ALPRAZolam (XANAX) 0.25 MG tablet Take 1 tablet (0.25 mg total) by mouth daily as needed for anxiety.   famotidine (PEPCID) 20 MG tablet TAKE 1 TABLET(20 MG) BY MOUTH TWICE DAILY   levothyroxine (SYNTHROID) 50 MCG tablet TAKE 1 TABLET BY MOUTH EVERY DAY BEFORE BREAKFAST   Multiple Vitamin (MULTIVITAMIN) tablet Take 1 tablet by mouth daily.   pantoprazole (PROTONIX) 40 MG tablet Take 1 tablet (40 mg total) by mouth daily.    No facility-administered encounter medications on file as of 06/10/2022.        REVIEW OF SYSTEMS  : All other systems reviewed and negative except where noted in the History of Present Illness.     PHYSICAL EXAM: BP 108/64 (BP Location: Left Arm, Patient Position: Sitting, Cuff Size: Normal)   Pulse 80   Ht 5' 2"$  (1.575 m)   Wt 120 lb 2 oz (54.5 kg)  LMP 05/22/2022   SpO2 98%   BMI 21.97 kg/m  General: Well developed white female in no acute distress Head: Normocephalic and atraumatic Eyes:  Sclerae anicteric, conjunctiva pink. Ears: Normal auditory acuity Lungs: Clear throughout to auscultation; no W/R/R. Heart: Regular rate and rhythm; no M/R/G. Abdomen: Soft, non-distended.  BS present.  Non-tender. Musculoskeletal: Symmetrical with no gross deformities  Skin: No lesions on visible extremities Extremities: No edema  Neurological: Alert oriented x 4, grossly non-focal Psychological:  Alert and cooperative. Normal mood and affect   ASSESSMENT AND PLAN: 42 year old female with complaints of dry throat (not mouth), increased belching, and globus sensation.  She had only been on Pepcid 20 mg daily.  Was just put on  pantoprazole 40 mg daily less than a week ago.  Has had some improvement, but has also been using ibuprofen.  Had been on pantoprazole in the past, but not for couple of years.  Suspect symptoms are due to reflux.  Her PCP is also checking her for Sjogren's.  We are going to increase her pantoprazole to twice daily and have her do the Pepcid at bedtime for about a month to see how she does.  New prescription for pantoprazole sent to the pharmacy.  If symptoms persist we can consider repeating EGD versus just doing an esophagram and may need repeat ENT evaluation.  She will message [place] with an update in about a month.   Recent H&P as above.  She did not respond to PPI as outlined.  Call the office complaining of belching.  Has been set up for endoscopy at this time.

## 2022-08-11 NOTE — Patient Instructions (Signed)
YOU HAD AN ENDOSCOPIC PROCEDURE TODAY AT THE [facility]:   Refer to the procedure report that was given to you for any specific questions about what was found during the examination.  If the procedure report does not answer your questions, please call your gastroenterologist to clarify.  If you requested that your care partner not be given the details of your procedure findings, then the procedure report has been included in a sealed envelope for you to review at your convenience later.  YOU SHOULD EXPECT: Some feelings of bloating in the abdomen. Passage of more gas than usual.  Walking can help get rid of the air that was put into your GI tract during the procedure and reduce the bloating. If you had a lower endoscopy (such as a colonoscopy or flexible sigmoidoscopy) you may notice spotting of blood in your stool or on the toilet paper. If you underwent a bowel prep for your procedure, you may not have a normal bowel movement for a few days.  Please Note:  You might notice some irritation and congestion in your nose or some drainage.  This is from the oxygen used during your procedure.  There is no need for concern and it should clear up in a day or so.  SYMPTOMS TO REPORT IMMEDIATELY:   Following upper endoscopy (EGD)  Vomiting of blood or coffee ground material  New chest pain or pain under the shoulder blades  Painful or persistently difficult swallowing  New shortness of breath  Fever of 100F or higher  Black, tarry-looking stools  For urgent or emergent issues, a gastroenterologist can be reached at any hour by calling [phone]. Do not use MyChart messaging for urgent concerns.    DIET:  We do recommend a small meal at first, but then you may proceed to your regular diet.  Drink plenty of fluids but you should avoid alcoholic beverages for 24 hours.  ACTIVITY:  You should plan to take it easy for the rest of today and you should NOT DRIVE or use heavy machinery  until tomorrow (because of the sedation medicines used during the test).    FOLLOW UP: Our staff will call the number listed on your records the next business day following your procedure.  We will call around 7:15- 8:00 am to check on you and address any questions or concerns that you may have regarding the information given to you following your procedure. If we do not reach you, we will leave a message.     If any biopsies were taken you will be contacted by phone or by letter within the next 1-3 weeks.  Please call us at [phone] if you have not heard about the biopsies in 3 weeks.    SIGNATURES/CONFIDENTIALITY: You and/or your care partner have signed paperwork which will be entered into your electronic medical record.  These signatures attest to the fact that that the information above on your After Visit Summary has been reviewed and is understood.  Full responsibility of the confidentiality of this discharge information lies with you and/or your care-partner. 

## 2022-08-12 ENCOUNTER — Telehealth: Payer: Self-pay | Admitting: *Deleted

## 2022-08-12 NOTE — Telephone Encounter (Signed)
  Follow up Call-     08/11/2022    9:32 AM  Call back number  Post procedure Call Back phone  # [phone]  Permission to leave phone message Yes     Patient questions:  Do you have a fever, pain , or abdominal swelling? No. Pain Score  0 *  Have you tolerated food without any problems? Yes.    Have you been able to return to your normal activities? Yes.    Do you have any questions about your discharge instructions: Diet   No. Medications  No. Follow up visit  No.  Do you have questions or concerns about your Care? No.  Actions: * If pain score is 4 or above: No action needed, pain <4.

## 2022-09-08 DIAGNOSIS — H6503 Acute serous otitis media, bilateral: Secondary | ICD-10-CM | POA: Diagnosis not present

## 2022-09-08 DIAGNOSIS — Z6822 Body mass index (BMI) 22.0-22.9, adult: Secondary | ICD-10-CM | POA: Diagnosis not present

## 2022-09-09 ENCOUNTER — Encounter: Payer: BC Managed Care – PPO | Admitting: Internal Medicine

## 2022-09-15 ENCOUNTER — Ambulatory Visit: Payer: BC Managed Care – PPO | Admitting: Internal Medicine

## 2022-09-16 ENCOUNTER — Ambulatory Visit: Payer: BC Managed Care – PPO | Admitting: Internal Medicine

## 2022-09-17 ENCOUNTER — Ambulatory Visit: Payer: BC Managed Care – PPO | Admitting: Nurse Practitioner

## 2022-09-17 VITALS — BP 102/66 | HR 67 | Temp 98.2°F | Ht 62.0 in | Wt 121.5 lb

## 2022-09-17 DIAGNOSIS — J309 Allergic rhinitis, unspecified: Secondary | ICD-10-CM | POA: Insufficient documentation

## 2022-09-17 MED ORDER — AZELASTINE HCL 0.1 % NA SOLN: 1.0000 | Freq: Two times a day (BID) | NASAL | 5 refills | Status: DC

## 2022-09-17 NOTE — Assessment & Plan Note (Signed)
Chronic, no sign of otitis media or otitis media with effusion. Recommend symptomatic treatment with oral antihistamine (allegra) and nasal spray, change from flonase to astelin nasal spray. Follow-up as needed. Due for CPE anytime after 10/02/22.

## 2022-09-17 NOTE — Progress Notes (Signed)
   Established Patient Office Visit  Subjective   Patient ID: Mindy Bright, female    DOB: [birth year]  Age: 42 y.o. MRN: [id number]  Chief Complaint  Patient presents with   Ear Pain    Dull pain, not consistent  Whooshing sound before she went to urgent care  Was given steroid shot which helped with the pain, prescribe methylprednisolone which did not helped     Right dull ear pain x 1 week. Went to UC last Tuesday, told she had a middle ear effusion but no infection. Treated with steroid injection and steroid dose pack.  Has also started daily Allegra and Flonase nasal spray.  Symptoms seem a little bit better but are persistent so patient wanted to be reevaluated. Also experiencing some post nasal drip and seasonal allergies.     Review of Systems  Constitutional:  Negative for chills and fever.      Objective:     BP 102/66   Pulse 67   Temp 98.2 F (36.8 C) (Temporal)   Ht 5\' 2"  (1.575 m)   Wt 121 lb 8 oz (55.1 kg)   SpO2 98%   BMI 22.22 kg/m    Physical Exam Vitals reviewed.  Constitutional:      General: She is not in acute distress.    Appearance: Normal appearance.  HENT:     Head: Normocephalic and atraumatic.     Right Ear: Hearing, tympanic membrane, ear canal and external ear normal.     Left Ear: Hearing, tympanic membrane, ear canal and external ear normal.  Neck:     Vascular: No carotid bruit.  Cardiovascular:     Rate and Rhythm: Normal rate and regular rhythm.     Pulses: Normal pulses.     Heart sounds: Normal heart sounds.  Pulmonary:     Effort: Pulmonary effort is normal.     Breath sounds: Normal breath sounds.  Skin:    General: Skin is warm and dry.  Neurological:     General: No focal deficit present.     Mental Status: She is alert and oriented to person, place, and time.  Psychiatric:        Mood and Affect: Mood normal.        Behavior: Behavior normal.        Judgment: Judgment normal.      No results found for any  visits on 09/17/22.    The 10-year ASCVD risk score (Arnett DK, et al., 2019) is: 0.4%    Assessment & Plan:   Problem List Items Addressed This Visit       Respiratory   Allergic rhinitis - Primary    Chronic, no sign of otitis media or otitis media with effusion. Recommend symptomatic treatment with oral antihistamine (allegra) and nasal spray, change from flonase to astelin nasal spray. Follow-up as needed. Due for CPE anytime after 10/02/22.      Relevant Medications   fexofenadine (ALLEGRA) 180 MG tablet   azelastine (ASTELIN) 0.1 % nasal spray    Return in about 2 months (around 11/17/2022) for CPE with Dr. Quay Burow.    Ailene Ards, NP

## 2022-12-07 NOTE — Progress Notes (Unsigned)
Subjective:    Patient ID: Mindy Bright, female    DOB: 1981/04/11, 42 y.o.   MRN: [id number]      HPI Mindy Bright is here for a Physical exam and her chronic medical problems.    Overall doing well.  Still las throat symptoms - comes and goes - no obvious pattern. EGD normal.  Not related to food or eating.  Medications and allergies reviewed with patient and updated if appropriate.  Current Outpatient Medications on File Prior to Visit  Medication Sig Dispense Refill   ALPRAZolam (XANAX) 0.25 MG tablet Take 1 tablet (0.25 mg total) by mouth daily as needed for anxiety. 30 tablet 0   fexofenadine (ALLEGRA) 180 MG tablet Take 180 mg by mouth daily.     levothyroxine (SYNTHROID) 50 MCG tablet TAKE 1 TABLET BY MOUTH EVERY DAY BEFORE BREAKFAST 90 tablet 3   Multiple Vitamin (MULTIVITAMIN) tablet Take 1 tablet by mouth daily.     No current facility-administered medications on file prior to visit.    Review of Systems  Constitutional:  Negative for fever.  HENT:  Positive for voice change (intermittent). Negative for sore throat and trouble swallowing.   Eyes:  Negative for visual disturbance.  Respiratory:  Negative for cough, shortness of breath and wheezing.   Cardiovascular:  Negative for chest pain, palpitations and leg swelling.  Gastrointestinal:  Positive for constipation. Negative for abdominal pain, blood in stool, diarrhea and nausea.       No gerd or belching  Genitourinary:  Negative for dysuria.  Musculoskeletal:  Positive for back pain (sees chiropractor). Negative for arthralgias.  Skin:  Negative for rash.  Neurological:  Negative for light-headedness and headaches.  Psychiatric/Behavioral:  Negative for dysphoric mood. The patient is nervous/anxious.        Objective:   Vitals:   12/08/22 1322  BP: 104/60  Pulse: 76  Temp: 98.4 F (36.9 C)  SpO2: 98%   Filed Weights   12/08/22 1322  Weight: 116 lb (52.6 kg)   Body mass index is 21.22  kg/m.  BP Readings from Last 3 Encounters:  12/08/22 104/60  09/17/22 102/66  08/11/22 110/66    Wt Readings from Last 3 Encounters:  12/08/22 116 lb (52.6 kg)  09/17/22 121 lb 8 oz (55.1 kg)  08/11/22 120 lb (54.4 kg)       Physical Exam Constitutional: She appears well-developed and well-nourished. No distress.  HENT:  Head: Normocephalic and atraumatic.  Right Ear: External ear normal. Normal ear canal and TM Left Ear: External ear normal.  Normal ear canal and TM Mouth/Throat: Oropharynx is clear and moist.  Eyes: Conjunctivae normal.  Neck: Neck supple. No tracheal deviation present. No thyromegaly present.  No carotid bruit  Cardiovascular: Normal rate, regular rhythm and normal heart sounds.   No murmur heard.  No edema. Pulmonary/Chest: Effort normal and breath sounds normal. No respiratory distress. She has no wheezes. She has no rales.  Breast: deferred   Abdominal: Soft. She exhibits no distension. There is no tenderness.  Lymphadenopathy: She has no cervical adenopathy.  Skin: Skin is warm and dry. She is not diaphoretic.  Psychiatric: She has a normal mood and affect. Her behavior is normal.     Lab Results  Component Value Date   WBC 10.6 (H) 07/03/2022   HGB 13.2 07/03/2022   HCT 38.8 07/03/2022   PLT 388.0 07/03/2022   GLUCOSE 105 (H) 06/08/2022   CHOL 200 10/01/2021   TRIG 95.0 10/01/2021  HDL 55.50 10/01/2021   LDLCALC 125 (H) 10/01/2021   ALT 12 02/06/2022   AST 14 02/06/2022   NA 136 06/08/2022   K 4.0 06/08/2022   CL 101 06/08/2022   CREATININE 0.85 06/08/2022   BUN 17 06/08/2022   CO2 27 06/08/2022   TSH 3.58 06/08/2022   HGBA1C 5.9 02/06/2022         Assessment & Plan:   Physical exam: Screening blood work  ordered Exercise  regular Weight normal Substance abuse  none Stressed healthy diet  Reviewed recommended immunizations.   Health Maintenance  Topic Date Due   MAMMOGRAM  Never done   COVID-19 Vaccine (1)  12/24/2022 (Originally 06/07/1981)   Hepatitis C Screening  04/26/2054 (Originally 12/07/1998)   INFLUENZA VACCINE  01/28/2023   PAP SMEAR-Modifier  10/01/2024   DTaP/Tdap/Td (3 - Td or Tdap) 03/25/2028   HIV Screening  Completed   HPV VACCINES  Aged Out          See Problem List for Assessment and Plan of chronic medical problems.

## 2022-12-07 NOTE — Patient Instructions (Addendum)

## 2022-12-08 ENCOUNTER — Other Ambulatory Visit (HOSPITAL_COMMUNITY): Payer: Self-pay | Admitting: Adult Health

## 2022-12-08 ENCOUNTER — Ambulatory Visit (INDEPENDENT_AMBULATORY_CARE_PROVIDER_SITE_OTHER): Payer: BC Managed Care – PPO | Admitting: Internal Medicine

## 2022-12-08 ENCOUNTER — Encounter: Payer: Self-pay | Admitting: Internal Medicine

## 2022-12-08 VITALS — BP 104/60 | HR 76 | Temp 98.4°F | Ht 62.0 in | Wt 116.0 lb

## 2022-12-08 DIAGNOSIS — E039 Hypothyroidism, unspecified: Secondary | ICD-10-CM | POA: Diagnosis not present

## 2022-12-08 DIAGNOSIS — Z0000 Encounter for general adult medical examination without abnormal findings: Secondary | ICD-10-CM | POA: Diagnosis not present

## 2022-12-08 DIAGNOSIS — K219 Gastro-esophageal reflux disease without esophagitis: Secondary | ICD-10-CM

## 2022-12-08 DIAGNOSIS — F418 Other specified anxiety disorders: Secondary | ICD-10-CM

## 2022-12-08 DIAGNOSIS — R7303 Prediabetes: Secondary | ICD-10-CM

## 2022-12-08 DIAGNOSIS — Z13220 Encounter for screening for lipoid disorders: Secondary | ICD-10-CM

## 2022-12-08 DIAGNOSIS — Z1231 Encounter for screening mammogram for malignant neoplasm of breast: Secondary | ICD-10-CM

## 2022-12-08 DIAGNOSIS — D72829 Elevated white blood cell count, unspecified: Secondary | ICD-10-CM

## 2022-12-08 LAB — CBC WITH DIFFERENTIAL/PLATELET
Basophils Absolute: 0.1 10*3/uL (ref 0.0–0.1)
Basophils Relative: 0.7 % (ref 0.0–3.0)
Eosinophils Absolute: 0.3 10*3/uL (ref 0.0–0.7)
Eosinophils Relative: 2.9 % (ref 0.0–5.0)
HCT: 39.9 % (ref 36.0–46.0)
Hemoglobin: 13.1 g/dL (ref 12.0–15.0)
Lymphocytes Relative: 31.6 % (ref 12.0–46.0)
Lymphs Abs: 3 10*3/uL (ref 0.7–4.0)
MCHC: 32.9 g/dL (ref 30.0–36.0)
MCV: 85.8 fl (ref 78.0–100.0)
Monocytes Absolute: 0.6 10*3/uL (ref 0.1–1.0)
Monocytes Relative: 6.6 % (ref 3.0–12.0)
Neutro Abs: 5.4 10*3/uL (ref 1.4–7.7)
Neutrophils Relative %: 58.2 % (ref 43.0–77.0)
Platelets: 343 10*3/uL (ref 150.0–400.0)
RBC: 4.65 Mil/uL (ref 3.87–5.11)
RDW: 13.2 % (ref 11.5–15.5)
WBC: 9.4 10*3/uL (ref 4.0–10.5)

## 2022-12-08 LAB — LIPID PANEL
Cholesterol: 193 mg/dL (ref 0–200)
HDL: 39.8 mg/dL (ref >39.00)
Total CHOL/HDL Ratio: 5
Triglycerides: 541 mg/dL — ABNORMAL HIGH (ref 0.0–149.0)

## 2022-12-08 LAB — COMPREHENSIVE METABOLIC PANEL
ALT: 15 U/L (ref 0–35)
AST: 16 U/L (ref 0–37)
Albumin: 4.3 g/dL (ref 3.5–5.2)
Alkaline Phosphatase: 65 U/L (ref 39–117)
BUN: 17 mg/dL (ref 6–23)
CO2: 29 mEq/L (ref 19–32)
Calcium: 9.2 mg/dL (ref 8.4–10.5)
Chloride: 101 mEq/L (ref 96–112)
Creatinine, Ser: 0.83 mg/dL (ref 0.40–1.20)
GFR: 87.21 mL/min (ref >60.00)
Glucose, Bld: 83 mg/dL (ref 70–99)
Potassium: 3.7 mEq/L (ref 3.5–5.1)
Sodium: 137 mEq/L (ref 135–145)
Total Bilirubin: 0.4 mg/dL (ref 0.2–1.2)
Total Protein: 7.5 g/dL (ref 6.0–8.3)

## 2022-12-08 LAB — TSH: TSH: 2.26 u[IU]/mL (ref 0.35–5.50)

## 2022-12-08 LAB — HEMOGLOBIN A1C: Hgb A1c MFr Bld: 5.9 % (ref 4.6–6.5)

## 2022-12-08 LAB — LDL CHOLESTEROL, DIRECT: Direct LDL: 101 mg/dL

## 2022-12-08 MED ORDER — PANTOPRAZOLE SODIUM 20 MG PO TBEC: 20.0000 mg | DELAYED_RELEASE_TABLET | Freq: Every day | ORAL | 5 refills | Status: DC

## 2022-12-08 NOTE — Assessment & Plan Note (Addendum)
Chronic GERD controlled Taking pantoprazole 40 mg daily  Will try to taper -  start 20 mg alternating with 40 mg and taper as tolerated Will restart pepcid

## 2022-12-08 NOTE — Assessment & Plan Note (Signed)
Chronic Check a1c Low sugar / carb diet Stressed regular exercise  

## 2022-12-08 NOTE — Assessment & Plan Note (Signed)
Chronic  Clinically euthyroid Check tsh and will titrate med dose if needed Currently taking levothyroxine 50 mcg daily 

## 2022-12-08 NOTE — Assessment & Plan Note (Signed)
Chronic Has situational anxiety She is more anxious now because of her current symptoms Continue alprazolam 0.25 mg daily as needed - has never taken

## 2022-12-08 NOTE — Assessment & Plan Note (Signed)
Mild Likely reactive CBC

## 2022-12-10 ENCOUNTER — Encounter: Payer: Self-pay | Admitting: Internal Medicine

## 2022-12-10 DIAGNOSIS — E781 Pure hyperglyceridemia: Secondary | ICD-10-CM

## 2022-12-23 ENCOUNTER — Other Ambulatory Visit (INDEPENDENT_AMBULATORY_CARE_PROVIDER_SITE_OTHER): Payer: BC Managed Care – PPO

## 2022-12-23 ENCOUNTER — Ambulatory Visit (HOSPITAL_COMMUNITY)
Admission: RE | Admit: 2022-12-23 | Discharge: 2022-12-23 | Disposition: A | Discharge status: Home / Self Care | Payer: BC Managed Care – PPO | Source: Ambulatory Visit | Attending: Adult Health | Admitting: Adult Health

## 2022-12-23 DIAGNOSIS — Z1231 Encounter for screening mammogram for malignant neoplasm of breast: Secondary | ICD-10-CM | POA: Diagnosis not present

## 2022-12-23 DIAGNOSIS — E781 Pure hyperglyceridemia: Secondary | ICD-10-CM | POA: Diagnosis not present

## 2022-12-23 LAB — LIPID PANEL
Cholesterol: 183 mg/dL (ref 0–200)
HDL: 43.3 mg/dL (ref >39.00)
LDL Cholesterol: 113 mg/dL — ABNORMAL HIGH (ref 0–99)
NonHDL: 139.36
Total CHOL/HDL Ratio: 4
Triglycerides: 130 mg/dL (ref 0.0–149.0)
VLDL: 26 mg/dL (ref 0.0–40.0)

## 2023-01-04 ENCOUNTER — Encounter: Payer: Self-pay | Admitting: Adult Health

## 2023-01-04 ENCOUNTER — Ambulatory Visit (INDEPENDENT_AMBULATORY_CARE_PROVIDER_SITE_OTHER): Payer: BC Managed Care – PPO | Admitting: Adult Health

## 2023-01-04 VITALS — BP 105/69 | HR 67 | Ht 62.0 in | Wt 115.0 lb

## 2023-01-04 DIAGNOSIS — Z01419 Encounter for gynecological examination (general) (routine) without abnormal findings: Secondary | ICD-10-CM

## 2023-01-04 DIAGNOSIS — Z1211 Encounter for screening for malignant neoplasm of colon: Secondary | ICD-10-CM

## 2023-01-04 LAB — HEMOCCULT GUIAC POC 1CARD (OFFICE): Fecal Occult Blood, POC: NEGATIVE

## 2023-01-04 NOTE — Progress Notes (Signed)
Patient ID: Mindy Bright, female   DOB: 1981-03-05, 42 y.o.   MRN: [id number] History of Present Illness: Mindy Bright is a 43 year old white female, married, G2P2002 in for a well woman gyn exam..     Component Value Date/Time   DIAGPAP  10/01/2021 1336    - Negative for Intraepithelial Lesions or Malignancy (NILM)   DIAGPAP - Benign reactive/reparative changes 10/01/2021 1336   DIAGPAP  07/17/2020 1601    - Negative for intraepithelial lesion or malignancy (NILM)   HPVHIGH Negative 10/01/2021 1336   HPVHIGH Negative 07/17/2020 1601   HPVHIGH Negative 03/29/2019 1642   ADEQPAP  10/01/2021 1336    Satisfactory for evaluation; transformation zone component PRESENT.   ADEQPAP  07/17/2020 1601    Satisfactory for evaluation; transformation zone component PRESENT.   ADEQPAP  03/29/2019 1642    Satisfactory for evaluation; transformation zone component ABSENT.    PCP is Dr Lawerance Bach   Current Medications, Allergies, Past Medical History, Past Surgical History, Family History and Social History were reviewed in [employer] electronic medical record.     Review of Systems: Patient denies any headaches, hearing loss, fatigue, blurred vision, shortness of breath, chest pain, abdominal pain, problems with bowel movements, urination, or intercourse. No joint pain or mood swings.  Last period was 2 weeks late    Physical Exam:BP 105/69 (BP Location: Left Arm, Patient Position: Sitting, Cuff Size: Normal)   Pulse 67   Ht 5\' 2"  (1.575 m)   Wt 115 lb (52.2 kg)   LMP 12/28/2022   BMI 21.03 kg/m   General:  Well developed, well nourished, no acute distress Skin:  Warm and dry Neck:  Midline trachea, normal thyroid, good ROM, no lymphadenopathy Lungs; Clear to auscultation bilaterally Breast:  No dominant palpable mass, retraction, or nipple discharge Cardiovascular: Regular rate and rhythm Abdomen:  Soft, non tender, no hepatosplenomegaly Pelvic:  External genitalia is normal in  appearance, no lesions.  The vagina is normal in appearance. Urethra has no lesions or masses. The cervix is bulbous.  Uterus is felt to be normal size, shape, and contour.  No adnexal masses or tenderness noted.Bladder is non tender, no masses felt. Rectal: Good sphincter tone, no polyps, or hemorrhoids felt.  Hemoccult negative. Extremities/musculoskeletal:  No swelling or varicosities noted, no clubbing or cyanosis Psych:  No mood changes, alert and cooperative,seems happy AA is 0 Fall risk is low    01/04/2023    8:38 AM 07/03/2022    8:06 AM 06/05/2022    4:44 PM  Depression screen PHQ 2/9  Decreased Interest 0 0 0  Down, Depressed, Hopeless 0 0 0  PHQ - 2 Score 0 0 0  Altered sleeping 0 0 0  Tired, decreased energy 0 0 0  Change in appetite 0 0 0  Feeling bad or failure about yourself  0 0 0  Trouble concentrating 0 0 0  Moving slowly or fidgety/restless 0 0 0  Suicidal thoughts 0 0 0  PHQ-9 Score 0 0 0  Difficult doing work/chores  Not difficult at all Not difficult at all       01/04/2023    8:38 AM 10/01/2021    1:30 PM 07/17/2020    3:53 PM  GAD 7 : Generalized Anxiety Score  Nervous, Anxious, on Edge 0 0 0  Control/stop worrying 0 0 0  Worry too much - different things 0 0 0  Trouble relaxing 0 0 0  Restless 0 0 0  Easily annoyed  or irritable 0 0 0  Afraid - awful might happen 0 0 0  Total GAD 7 Score 0 0 0      Upstream - 01/04/23 1610       Pregnancy Intention Screening   Does the patient want to become pregnant in the next year? No    Does the patient's partner want to become pregnant in the next year? No    Would the patient like to discuss contraceptive options today? No      Contraception Wrap Up   Current Method Vasectomy    End Method Vasectomy    Contraception Counseling Provided No            Examination chaperoned by Malachy Mood LPN  Impression and Plan: Encounter for well woman exam with routine gynecological exam Physical in 1 year Pap in  2026 Labs with PCP Mammogram was negative 12/23/22   Encounter for screening fecal occult blood testing Hemoccult was negative

## 2023-01-20 ENCOUNTER — Encounter: Payer: Self-pay | Admitting: Internal Medicine

## 2023-01-21 MED ORDER — ALPRAZOLAM 0.25 MG PO TABS: 0.2500 mg | ORAL_TABLET | Freq: Every day | ORAL | 0 refills | Status: DC | PRN

## 2023-01-24 MED ORDER — ALPRAZOLAM 0.25 MG PO TABS: 0.2500 mg | ORAL_TABLET | Freq: Every day | ORAL | 0 refills | Status: AC | PRN

## 2023-01-24 NOTE — Addendum Note (Signed)
Addended by: Pincus Sanes on: 01/24/2023 02:23 PM   Modules accepted: Orders

## 2023-02-02 DIAGNOSIS — J302 Other seasonal allergic rhinitis: Secondary | ICD-10-CM | POA: Diagnosis not present

## 2023-02-02 DIAGNOSIS — R0982 Postnasal drip: Secondary | ICD-10-CM | POA: Diagnosis not present

## 2023-02-02 DIAGNOSIS — J029 Acute pharyngitis, unspecified: Secondary | ICD-10-CM | POA: Diagnosis not present

## 2023-04-16 ENCOUNTER — Ambulatory Visit: Payer: BC Managed Care – PPO | Admitting: Internal Medicine

## 2023-04-16 ENCOUNTER — Other Ambulatory Visit (HOSPITAL_COMMUNITY)
Admission: RE | Admit: 2023-04-16 | Discharge: 2023-04-16 | Disposition: A | Discharge status: Home / Self Care | Payer: BC Managed Care – PPO | Source: Ambulatory Visit | Attending: Obstetrics & Gynecology | Admitting: Obstetrics & Gynecology

## 2023-04-16 ENCOUNTER — Encounter: Payer: Self-pay | Admitting: Internal Medicine

## 2023-04-16 ENCOUNTER — Other Ambulatory Visit (INDEPENDENT_AMBULATORY_CARE_PROVIDER_SITE_OTHER): Payer: BC Managed Care – PPO

## 2023-04-16 VITALS — BP 104/72 | HR 78 | Temp 98.5°F | Ht 62.0 in | Wt 120.8 lb

## 2023-04-16 DIAGNOSIS — R350 Frequency of micturition: Secondary | ICD-10-CM | POA: Diagnosis not present

## 2023-04-16 DIAGNOSIS — R300 Dysuria: Secondary | ICD-10-CM | POA: Diagnosis not present

## 2023-04-16 DIAGNOSIS — K219 Gastro-esophageal reflux disease without esophagitis: Secondary | ICD-10-CM | POA: Diagnosis not present

## 2023-04-16 DIAGNOSIS — Z3202 Encounter for pregnancy test, result negative: Secondary | ICD-10-CM

## 2023-04-16 DIAGNOSIS — R1031 Right lower quadrant pain: Secondary | ICD-10-CM | POA: Diagnosis not present

## 2023-04-16 DIAGNOSIS — R3915 Urgency of urination: Secondary | ICD-10-CM

## 2023-04-16 DIAGNOSIS — R1032 Left lower quadrant pain: Secondary | ICD-10-CM | POA: Insufficient documentation

## 2023-04-16 LAB — POC URINALSYSI DIPSTICK (AUTOMATED)
Bilirubin, UA: NEGATIVE
Blood, UA: NEGATIVE
Glucose, UA: NEGATIVE
Ketones, UA: NEGATIVE
Leukocytes, UA: NEGATIVE
Nitrite, UA: NEGATIVE
Protein, UA: NEGATIVE
Spec Grav, UA: 1.02 (ref 1.010–1.025)
Urobilinogen, UA: 0.2 U/dL
pH, UA: 6 (ref 5.0–8.0)

## 2023-04-16 LAB — POCT URINE PREGNANCY: Preg Test, Ur: NEGATIVE

## 2023-04-16 LAB — POCT URINALYSIS DIPSTICK OB
Blood, UA: NEGATIVE
Glucose, UA: NEGATIVE
Ketones, UA: NEGATIVE
Leukocytes, UA: NEGATIVE
Nitrite, UA: NEGATIVE
POC,PROTEIN,UA: NEGATIVE

## 2023-04-16 NOTE — Progress Notes (Signed)
   NURSE VISIT- UTI SYMPTOMS   SUBJECTIVE:  Mindy Bright is a 42 y.o. G8P2002 female here for UTI symptoms. She is a GYN patient. She reports dysuria, lower abdominal pain, and urinary frequency. Patient states she is already on Macrobid but still has symptoms of a UTI.  OBJECTIVE:  There were no vitals taken for this visit.  Appears well, in no apparent distress Pregnancy test negative  Results for orders placed or performed in visit on 04/16/23 (from the past 24 hour(s))  POCT urine pregnancy   Collection Time: 04/16/23  8:58 AM  Result Value Ref Range   Preg Test, Ur Negative Negative  POC Urinalysis Dipstick OB   Collection Time: 04/16/23  8:59 AM  Result Value Ref Range   Color, UA     Clarity, UA     Glucose, UA Negative Negative   Bilirubin, UA     Ketones, UA Negative    Spec Grav, UA     Blood, UA Negative    pH, UA     POC,PROTEIN,UA Negative Negative, Trace, Small (1+), Moderate (2+), Large (3+), 4+   Urobilinogen, UA     Nitrite, UA Negative    Leukocytes, UA Negative Negative   Appearance     Odor      ASSESSMENT: GYN patient with UTI symptoms and negative nitrites  PLAN: Discussed with  Mindy Grates NP    Rx sent by provider today: No Urine culture sent Self swab collected and sent for BV, Yeast, Trichomonas, GC, Chlamydia Call or return to clinic prn if these symptoms worsen or fail to improve as anticipated. Follow-up: as needed   Caralyn Guile  04/16/2023 10:09 AM

## 2023-04-16 NOTE — Progress Notes (Deleted)
   NURSE VISIT- UTI SYMPTOMS   SUBJECTIVE:  Mindy Bright is a 42 y.o. G13P2002 female here for UTI symptoms. She is a GYN patient. She reports dysuria, lower abdominal pain, and urinary frequency.   OBJECTIVE:  There were no vitals taken for this visit.  Appears well, in no apparent distress  Results for orders placed or performed in visit on 04/16/23 (from the past 24 hour(s))  POCT urine pregnancy   Collection Time: 04/16/23  8:58 AM  Result Value Ref Range   Preg Test, Ur Negative Negative  POC Urinalysis Dipstick OB   Collection Time: 04/16/23  8:59 AM  Result Value Ref Range   Color, UA     Clarity, UA     Glucose, UA Negative Negative   Bilirubin, UA     Ketones, UA Negative    Spec Grav, UA     Blood, UA Negative    pH, UA     POC,PROTEIN,UA Negative Negative, Trace, Small (1+), Moderate (2+), Large (3+), 4+   Urobilinogen, UA     Nitrite, UA Negative    Leukocytes, UA Negative Negative   Appearance     Odor      ASSESSMENT: GYN patient with UTI symptoms and negative nitrites  PLAN: Discussed with  Albertine Grates NP    Rx sent by provider today: No Urine culture sent Self swab collected and sent  Call or return to clinic prn if these symptoms worsen or fail to improve as anticipated. Follow-up: as needed   Mindy Bright  04/16/2023 10:03 AM

## 2023-04-16 NOTE — Assessment & Plan Note (Signed)
Acute ? Uti Started on macrobid a few days ago - symptoms better but still having some bladder pain or cramping and urinary frequency Saw gyn today - had vaginal swab Urine dip looks neg - will send for cx Complete macrobid Start and complete pyridium which may help Drinking lots of water - avoid coffee, tea, carbonated beverages and acid foods If no improvement would recommend following up with gyn

## 2023-04-16 NOTE — Progress Notes (Signed)
Subjective:    Patient ID: Mindy Bright, female    DOB: Jan 01, 1981, 42 y.o.   MRN: [id number]      HPI Kerre is here for  Chief Complaint  Patient presents with   Urinary Tract Infection    Urine frequency, cramping feeling in pelvic area; Started macrobid on Monday; Urine was tested today but was clear; She still has symptoms of UTI. States bladder feels full    Symptoms started last Thursday.  She had some urine leakage, which is new.  She had some bladder pain the next day.  Sunday she did a VV and she was sent in macrobid and pyridium ( which she is not taking).  She started the macrobid Monday night.  Her symptoms improved, but yesterday she had mild cramping across her lower abdomen.  She saw gyn this morning and her urine test was normal.  They did a vaginal swab.        Medications and allergies reviewed with patient and updated if appropriate.  Current Outpatient Medications on File Prior to Visit  Medication Sig Dispense Refill   fexofenadine (ALLEGRA) 180 MG tablet Take 180 mg by mouth.     ipratropium (ATROVENT) 0.06 % nasal spray USE 1 SPRAY IN EACH NOSTRIL THREE TIMES DAILY AS DIRECTED     levothyroxine (SYNTHROID) 50 MCG tablet TAKE 1 TABLET BY MOUTH EVERY DAY BEFORE BREAKFAST 90 tablet 3   Multiple Vitamin (MULTIVITAMIN) tablet Take 1 tablet by mouth daily.     nitrofurantoin, macrocrystal-monohydrate, (MACROBID) 100 MG capsule Take 100 mg by mouth 2 (two) times daily.     pantoprazole (PROTONIX) 20 MG tablet Take 1 tablet (20 mg total) by mouth daily. 30 tablet 5   ALPRAZolam (XANAX) 0.25 MG tablet Take 1 tablet (0.25 mg total) by mouth daily as needed for anxiety. (Patient not taking: Reported on 04/16/2023) 10 tablet 0   No current facility-administered medications on file prior to visit.    Review of Systems  Constitutional:  Negative for fever.  Gastrointestinal:  Positive for abdominal pain (suprapubic cramping) and nausea (earlier this week - ?  from abx).  Genitourinary:  Positive for frequency and urgency. Negative for dysuria, hematuria, vaginal bleeding and vaginal discharge.       Urine looks normal       Objective:   Vitals:   04/16/23 1054  BP: 104/72  Pulse: 78  Temp: 98.5 F (36.9 C)  SpO2: 98%   BP Readings from Last 3 Encounters:  04/16/23 104/72  01/04/23 105/69  12/08/22 104/60   Wt Readings from Last 3 Encounters:  04/16/23 120 lb 12.8 oz (54.8 kg)  01/04/23 115 lb (52.2 kg)  12/08/22 116 lb (52.6 kg)   Body mass index is 22.09 kg/m.    Physical Exam Vitals reviewed.  Constitutional:      General: She is not in acute distress.    Appearance: Normal appearance. She is not ill-appearing.  HENT:     Head: Normocephalic.  Eyes:     Conjunctiva/sclera: Conjunctivae normal.  Abdominal:     General: There is no distension.     Palpations: Abdomen is soft.     Tenderness: There is no abdominal tenderness. There is no right CVA tenderness, left CVA tenderness, guarding or rebound.  Skin:    General: Skin is warm and dry.  Neurological:     Mental Status: She is alert.            Assessment &  Plan:    See Problem List for Assessment and Plan of chronic medical problems.

## 2023-04-16 NOTE — Assessment & Plan Note (Signed)
Chronic GERD controlled Taking pantoprazole 20 mg daily

## 2023-04-16 NOTE — Patient Instructions (Addendum)
      Medications changes include :   complete the macrobid and take the pyridium.  Eat a bland diet and drink lot of water.      Return if symptoms worsen or fail to improve.

## 2023-04-17 LAB — URINALYSIS, ROUTINE W REFLEX MICROSCOPIC
Bilirubin, UA: NEGATIVE
Glucose, UA: NEGATIVE
Ketones, UA: NEGATIVE
Leukocytes,UA: NEGATIVE
Nitrite, UA: NEGATIVE
Protein,UA: NEGATIVE
RBC, UA: NEGATIVE
Specific Gravity, UA: 1.013 (ref 1.005–1.030)
Urobilinogen, Ur: 0.2 mg/dL (ref 0.2–1.0)
pH, UA: 5.5 (ref 5.0–7.5)

## 2023-04-18 LAB — URINE CULTURE

## 2023-04-18 LAB — CULTURE, URINE COMPREHENSIVE

## 2023-04-19 ENCOUNTER — Telehealth: Payer: Self-pay | Admitting: Obstetrics & Gynecology

## 2023-04-19 ENCOUNTER — Encounter: Payer: Self-pay | Admitting: Internal Medicine

## 2023-04-19 LAB — CERVICOVAGINAL ANCILLARY ONLY
Bacterial Vaginitis (gardnerella): NEGATIVE
Candida Glabrata: NEGATIVE
Candida Vaginitis: POSITIVE — AB
Chlamydia: NEGATIVE
Comment: NEGATIVE
Comment: NEGATIVE
Comment: NEGATIVE
Comment: NEGATIVE
Comment: NEGATIVE
Comment: NORMAL
Neisseria Gonorrhea: NEGATIVE
Trichomonas: NEGATIVE

## 2023-04-19 NOTE — Telephone Encounter (Signed)
Patient calling about lab results. Please advise.

## 2023-04-20 ENCOUNTER — Telehealth: Payer: Self-pay | Admitting: *Deleted

## 2023-04-20 MED ORDER — FLUCONAZOLE 150 MG PO TABS: ORAL_TABLET | ORAL | 1 refills | Status: DC

## 2023-04-20 NOTE — Addendum Note (Signed)
Addended by: Cyril Mourning A on: 04/20/2023 05:32 PM   Modules accepted: Orders

## 2023-04-20 NOTE — Telephone Encounter (Signed)
Rx sent in for diflucan.   

## 2023-04-20 NOTE — Telephone Encounter (Signed)
Patient has yeast positive on swab on 10/18.  Wants treatment sent to [facility] in [place]. Advised to check back with pharmacy later this afternoon.

## 2023-04-21 ENCOUNTER — Ambulatory Visit: Payer: BC Managed Care – PPO | Admitting: Obstetrics & Gynecology

## 2023-04-28 ENCOUNTER — Ambulatory Visit: Payer: BC Managed Care – PPO | Admitting: Obstetrics & Gynecology

## 2023-04-28 ENCOUNTER — Encounter: Payer: Self-pay | Admitting: Obstetrics & Gynecology

## 2023-04-28 VITALS — BP 120/79 | HR 81 | Ht 62.0 in | Wt 122.2 lb

## 2023-04-28 DIAGNOSIS — R102 Pelvic and perineal pain unspecified side: Secondary | ICD-10-CM

## 2023-04-28 DIAGNOSIS — N946 Dysmenorrhea, unspecified: Secondary | ICD-10-CM

## 2023-04-28 DIAGNOSIS — N939 Abnormal uterine and vaginal bleeding, unspecified: Secondary | ICD-10-CM | POA: Diagnosis not present

## 2023-04-28 DIAGNOSIS — R4589 Other symptoms and signs involving emotional state: Secondary | ICD-10-CM

## 2023-04-28 NOTE — Progress Notes (Signed)
GYN VISIT Patient name: Mindy Bright MRN [id number]  Date of birth: Feb 08, 1981 Chief Complaint:   Gynecologic Exam (Needs exam for abdominal cramping and long period of 9 days)  History of Present Illness:   Mindy Bright is a 42 y.o. [id number] female being seen today for the following concerns:  Started 2 wks ago- "UTI symptoms" noted back pain and urinary frequency.  She did telemedicine- pt given Macrobid x 5 days for UTI.  She still noted pelvic cramping- came in for RN visit- self swabs obtained.  UCX neg, +yeast and seen by PCP who also rechecked urine and was found to be negative.  She is still noting intermittent pelvic pain and burning.  Dull pain, taking ibuprofen and not much improvement. Then started her period, 10/20 and it was "abnormal for her"  Started super heavy x 1 day then stopped x 1 day then back to moderate to heavy bleeding.  Bleeding was super heavy and required changing every 2 hours.  Typically periods are 3-5 days usually about 3-4 pads per day.  States she doesn't know what is going on with her and concerned that something is wrong.  Denies irregular discharge, itching or irritation.  No urinary symptoms currently.  No fevers or chills.  Denies GI concerns  Contraception: vasectomy      Patient's last menstrual period was 04/18/2023 (exact date).    Review of Systems:   Pertinent items are noted in HPI Denies fever/chills, dizziness, headaches, visual disturbances, fatigue, shortness of breath, chest pain, vomiting. Pertinent History Reviewed:   Past Surgical History:  Procedure Laterality Date   COLONOSCOPY  07/2019   UPPER GASTROINTESTINAL ENDOSCOPY     UPPER GI ENDOSCOPY  07/2019   WISDOM TOOTH EXTRACTION      Past Medical History:  Diagnosis Date   Abnormal Pap smear    Anemia    Anxiety    Gall bladder disease    GERD (gastroesophageal reflux disease)    H/O hiatal hernia    Irregular periods 04/30/2014   Patient desires pregnancy  12/11/2013   Pregnant 10/26/2014   Reflux 11/09/2014   Thyroid disease    Unspecified symptom associated with female genital organs 12/11/2013   Vaginal discharge 12/11/2013   Vaginal Pap smear, abnormal    >10 years ago, no treatment   Reviewed problem list, medications and allergies. Physical Assessment:   Vitals:   04/28/23 1002  BP: 120/79  Pulse: 81  Weight: 122 lb 3.2 oz (55.4 kg)  Height: 5\' 2"  (1.575 m)  Body mass index is 22.35 kg/m.       Physical Examination:   General appearance: alert, well appearing, and in no distress  Psych: mood appropriate, normal affect  Skin: warm & dry   Cardiovascular: normal heart rate noted  Respiratory: normal respiratory effort, no distress  Abdomen: soft, non-tender, no rebound no guarding  Pelvic: VULVA: normal appearing vulva with no masses, tenderness or lesions, VAGINA: normal appearing vagina with normal color and discharge, no lesions, minimal menses noted CERVIX: normal appearing cervix without discharge or lesions, UTERUS: uterus is normal size, shape, consistency and nontender, ADNEXA: normal adnexa in size, nontender, slight fullness appreciated on right  Extremities: no edema   Chaperone:  Patient declined     Assessment & Plan:  1) AUB, Pelvic pain/dysmenorrhea -Plan for pelvic ultrasound to rule out underlying etiology -Reassured patient of benign findings on exam -Discussed AUB-O/perimenopause and potential symptoms.  While this was only her first regular  period briefly discussed medical management versus monitoring symptoms -Questions and concerns were addressed -At this time she plans to just monitor her bleeding pattern and await the results of ultrasound for neck step  -urinary work and vaginitis panel already completed and reviewed  Orders Placed This Encounter  Procedures   US PELVIC COMPLETE WITH TRANSVAGINAL    Return if symptoms worsen or fail to improve, for pelvic US- either AP or here (next  available).   Myna Hidalgo, DO Attending Obstetrician & Gynecologist, [facility] for [employer], [facility]

## 2023-04-29 ENCOUNTER — Other Ambulatory Visit: Payer: Self-pay | Admitting: Obstetrics & Gynecology

## 2023-04-29 DIAGNOSIS — R102 Pelvic and perineal pain: Secondary | ICD-10-CM

## 2023-04-30 ENCOUNTER — Ambulatory Visit (HOSPITAL_BASED_OUTPATIENT_CLINIC_OR_DEPARTMENT_OTHER): Admission: RE | Admit: 2023-04-30 | Payer: Self-pay | Source: Ambulatory Visit

## 2023-05-01 ENCOUNTER — Ambulatory Visit (HOSPITAL_BASED_OUTPATIENT_CLINIC_OR_DEPARTMENT_OTHER)
Admission: RE | Admit: 2023-05-01 | Discharge: 2023-05-01 | Disposition: A | Discharge status: Home / Self Care | Payer: BC Managed Care – PPO | Source: Ambulatory Visit | Attending: Obstetrics & Gynecology | Admitting: Obstetrics & Gynecology

## 2023-05-01 DIAGNOSIS — R102 Pelvic and perineal pain: Secondary | ICD-10-CM | POA: Insufficient documentation

## 2023-05-03 ENCOUNTER — Encounter (HOSPITAL_COMMUNITY): Payer: Self-pay

## 2023-05-03 ENCOUNTER — Ambulatory Visit (HOSPITAL_COMMUNITY): Admission: RE | Admit: 2023-05-03 | Payer: BC Managed Care – PPO | Source: Ambulatory Visit

## 2023-05-09 ENCOUNTER — Encounter: Payer: Self-pay | Admitting: Obstetrics & Gynecology

## 2023-05-10 ENCOUNTER — Other Ambulatory Visit (INDEPENDENT_AMBULATORY_CARE_PROVIDER_SITE_OTHER): Payer: BC Managed Care – PPO | Admitting: *Deleted

## 2023-05-10 ENCOUNTER — Other Ambulatory Visit (HOSPITAL_COMMUNITY)
Admission: RE | Admit: 2023-05-10 | Discharge: 2023-05-10 | Disposition: A | Discharge status: Home / Self Care | Payer: BC Managed Care – PPO | Source: Ambulatory Visit | Attending: Obstetrics & Gynecology | Admitting: Obstetrics & Gynecology

## 2023-05-10 ENCOUNTER — Telehealth: Payer: Self-pay | Admitting: Obstetrics & Gynecology

## 2023-05-10 DIAGNOSIS — N9489 Other specified conditions associated with female genital organs and menstrual cycle: Secondary | ICD-10-CM

## 2023-05-10 DIAGNOSIS — M545 Low back pain, unspecified: Secondary | ICD-10-CM

## 2023-05-10 DIAGNOSIS — R8290 Unspecified abnormal findings in urine: Secondary | ICD-10-CM | POA: Diagnosis not present

## 2023-05-10 LAB — POCT URINALYSIS DIPSTICK
Blood, UA: NEGATIVE
Glucose, UA: NEGATIVE
Ketones, UA: NEGATIVE
Leukocytes, UA: NEGATIVE
Nitrite, UA: NEGATIVE
Protein, UA: NEGATIVE

## 2023-05-10 NOTE — Telephone Encounter (Signed)
Called patient regarding MyChart message about ultrasound.  I had a chance to independently review the ultrasound.  No acute abnormalities noted.  Ovaries did show some small follicular cysts, but nothing to explain her current symptoms.  Patient reports that she is still having some pelvic burning and discomfort.  Advised that at this time etiology of symptoms remains unclear.  Encouraged patient to return to clinic for nurse visit for UA and culture with vaginitis panel  Patient notes she has made a follow-up appoint with urology for further evaluation and workup  Myna Hidalgo, DO Attending Obstetrician & Gynecologist, [facility], [facility]

## 2023-05-10 NOTE — Progress Notes (Signed)
   NURSE VISIT- VAGINITIS  SUBJECTIVE:  Mindy Bright is a 42 y.o. [id number] GYN patientfemale here for a vaginal swab for vaginitis screening.  She reports the following symptoms:  low back pain, abnormal odor with urine and vaginal burning  for several days. Denies abnormal vaginal bleeding, significant pelvic pain, fever.   OBJECTIVE:  LMP 04/18/2023 (Exact Date) Comment: This one is very long- 9 days  Appears well, in no apparent distress  ASSESSMENT: Vaginal swab for vaginitis screening; urine dip and urine culture  PLAN: Self-collected vaginal probe for Bacterial Vaginosis, Yeast sent to lab; urine culture sent to lab Treatment: to be determined once results are received Follow-up as needed if symptoms persist/worsen, or new symptoms develop  Malachy Mood  05/10/2023 3:26 PM

## 2023-05-12 LAB — URINE CULTURE: Organism ID, Bacteria: NO GROWTH

## 2023-05-12 LAB — CERVICOVAGINAL ANCILLARY ONLY
Bacterial Vaginitis (gardnerella): NEGATIVE
Candida Glabrata: NEGATIVE
Candida Vaginitis: NEGATIVE
Comment: NEGATIVE
Comment: NEGATIVE
Comment: NEGATIVE

## 2023-05-21 ENCOUNTER — Encounter: Payer: BC Managed Care – PPO | Admitting: Urology

## 2023-05-31 ENCOUNTER — Other Ambulatory Visit: Payer: Self-pay | Admitting: Internal Medicine

## 2023-06-09 DIAGNOSIS — Z6822 Body mass index (BMI) 22.0-22.9, adult: Secondary | ICD-10-CM | POA: Diagnosis not present

## 2023-06-09 DIAGNOSIS — H65491 Other chronic nonsuppurative otitis media, right ear: Secondary | ICD-10-CM | POA: Diagnosis not present

## 2023-06-09 DIAGNOSIS — M2669 Other specified disorders of temporomandibular joint: Secondary | ICD-10-CM | POA: Diagnosis not present

## 2023-06-24 DIAGNOSIS — H6691 Otitis media, unspecified, right ear: Secondary | ICD-10-CM | POA: Diagnosis not present

## 2023-06-24 DIAGNOSIS — J069 Acute upper respiratory infection, unspecified: Secondary | ICD-10-CM | POA: Diagnosis not present

## 2023-06-24 DIAGNOSIS — R0981 Nasal congestion: Secondary | ICD-10-CM | POA: Diagnosis not present

## 2023-07-18 ENCOUNTER — Other Ambulatory Visit: Payer: Self-pay | Admitting: Internal Medicine

## 2023-07-19 ENCOUNTER — Encounter: Payer: BC Managed Care – PPO | Admitting: Urology

## 2023-07-22 ENCOUNTER — Ambulatory Visit: Payer: BC Managed Care – PPO | Admitting: Internal Medicine

## 2023-08-12 ENCOUNTER — Encounter: Payer: Self-pay | Admitting: Urology

## 2023-08-12 ENCOUNTER — Ambulatory Visit: Payer: BC Managed Care – PPO | Admitting: Urology

## 2023-08-12 ENCOUNTER — Encounter: Payer: BC Managed Care – PPO | Admitting: Urology

## 2023-08-12 VITALS — BP 121/78 | HR 77

## 2023-08-12 DIAGNOSIS — R350 Frequency of micturition: Secondary | ICD-10-CM | POA: Diagnosis not present

## 2023-08-12 DIAGNOSIS — R3129 Other microscopic hematuria: Secondary | ICD-10-CM | POA: Diagnosis not present

## 2023-08-12 DIAGNOSIS — D3502 Benign neoplasm of left adrenal gland: Secondary | ICD-10-CM

## 2023-08-12 DIAGNOSIS — N816 Rectocele: Secondary | ICD-10-CM | POA: Diagnosis not present

## 2023-08-12 DIAGNOSIS — N393 Stress incontinence (female) (male): Secondary | ICD-10-CM | POA: Diagnosis not present

## 2023-08-12 DIAGNOSIS — R102 Pelvic and perineal pain: Secondary | ICD-10-CM

## 2023-08-12 NOTE — Progress Notes (Unsigned)
Subjective: 1. Urinary frequency   2. Microhematuria   3. Stress incontinence, female   4. Rectocele   5. Pelvic pain   6. Adrenal adenoma, left      Consult requested by Dr. Cheryll Cockayne and [facility].  08/12/23: Mindy Bright is a 43 yo female who was seeing her Gyn for UTI like symptoms but she was not felt to have infection.  She has some postcoital dysparunia and some increased leakage after sexual activity.  She has mild but new SUI and had a significant amount of leakage jumping playing volley ball.  She had had some right lower abdominal pain and frequency in the fall.  She had an [place] that was unremarkable.  She has had no gross hematuria.   She has had no nausea.  She has no history stones.  She has had no GU surgery.  She G2P2 with NVD's.  She has no prolapse symptoms but was told that she might have early prolapse.  She has some increased heaviness of her periods but she is regular.  She has some constipation.  Her UA has 6-10 WBC's 3-10 RBC's and a few bacteria.  She is about 2 days since her last period.  She had some flank pain in the past on the right as well.  ROS:  Review of Systems  HENT:  Positive for congestion.   Gastrointestinal:  Positive for heartburn.  All other systems reviewed and are negative.   Allergies  Allergen Reactions   Penicillins Swelling    Has patient had a PCN reaction causing immediate rash, facial/tongue/throat swelling, SOB or lightheadedness with hypotension: Yes Has patient had a PCN reaction causing severe rash involving mucus membranes or skin necrosis: No Has patient had a PCN reaction that required hospitalization Yes Has patient had a PCN reaction occurring within the last 10 years: No If all of the above answers are "NO", then may proceed with Cephalosporin use.    Past Medical History:  Diagnosis Date   Abnormal Pap smear    Anemia    Anxiety    Gall bladder disease    GERD (gastroesophageal reflux disease)    H/O hiatal hernia     Irregular periods 04/30/2014   Patient desires pregnancy 12/11/2013   Pregnant 10/26/2014   Reflux 11/09/2014   Thyroid disease    Unspecified symptom associated with female genital organs 12/11/2013   Vaginal discharge 12/11/2013   Vaginal Pap smear, abnormal    >10 years ago, no treatment    Past Surgical History:  Procedure Laterality Date   COLONOSCOPY  07/2019   UPPER GASTROINTESTINAL ENDOSCOPY     UPPER GI ENDOSCOPY  07/2019   WISDOM TOOTH EXTRACTION      Social History   Socioeconomic History   Marital status: Married    Spouse name: Not on file   Number of children: 2   Years of education: Not on file   Highest education level: Bachelor's degree (e.g., BA, AB, BS)  Occupational History   Occupation: PE Runner, broadcasting/film/video in [place]  Tobacco Use   Smoking status: Former    Current packs/day: 0.00    Average packs/day: 0.3 packs/day for 3.0 years (0.8 ttl pk-yrs)    Types: Cigarettes    Start date: 07/15/1999    Quit date: 07/14/2002    Years since quitting: 21.0   Smokeless tobacco: Never  Vaping Use   Vaping status: Never Used  Substance and Sexual Activity   Alcohol use: Not Currently  Comment: not now   Drug use: No   Sexual activity: Yes    Birth control/protection: Surgical    Comment: vasectomy   Other Topics Concern   Not on file  Social History Narrative   Not on file   Social Drivers of Health   Financial Resource Strain: Low Risk  (01/04/2023)   Overall Financial Resource Strain (CARDIA)    Difficulty of Paying Living Expenses: Not hard at all  Food Insecurity: No Food Insecurity (01/04/2023)   Hunger Vital Sign    Worried About Running Out of Food in the Last Year: Never true    Ran Out of Food in the Last Year: Never true  Transportation Needs: No Transportation Needs (01/04/2023)   PRAPARE - Administrator, Civil Service (Medical): No    Lack of Transportation (Non-Medical): No  Physical Activity: Insufficiently Active (01/04/2023)   Exercise  Vital Sign    Days of Exercise per Week: 3 days    Minutes of Exercise per Session: 20 min  Stress: No Stress Concern Present (01/04/2023)   [employer] of Occupational Health - Occupational Stress Questionnaire    Feeling of Stress : Not at all  Social Connections: Socially Integrated (01/04/2023)   Social Connection and Isolation Panel [NHANES]    Frequency of Communication with Friends and Family: More than three times a week    Frequency of Social Gatherings with Friends and Family: More than three times a week    Attends Religious Services: 1 to 4 times per year    Active Member of [employer] or Organizations: Yes    Attends Banker Meetings: 1 to 4 times per year    Marital Status: Married  Catering manager Violence: Not At Risk (01/04/2023)   Humiliation, Afraid, Rape, and Kick questionnaire    Fear of Current or Ex-Partner: No    Emotionally Abused: No    Physically Abused: No    Sexually Abused: No    Family History  Problem Relation Age of Onset   Diabetes Mother    Heart disease Paternal Grandfather    Diabetes Paternal Grandfather    Diabetes Maternal Grandmother    Hypertension Father    Heart disease Maternal Grandfather    Gout Brother    Diabetes Maternal Aunt    Colon cancer Neg Hx    Stomach cancer Neg Hx    Pancreatic cancer Neg Hx    Esophageal cancer Neg Hx    Liver disease Neg Hx    Rectal cancer Neg Hx     Anti-infectives: Anti-infectives (From admission, onward)    None       Current Outpatient Medications  Medication Sig Dispense Refill   ALPRAZolam (XANAX) 0.25 MG tablet Take 1 tablet (0.25 mg total) by mouth daily as needed for anxiety. 10 tablet 0   ipratropium (ATROVENT) 0.06 % nasal spray USE 1 SPRAY IN EACH NOSTRIL THREE TIMES DAILY AS DIRECTED     levothyroxine (SYNTHROID) 50 MCG tablet TAKE 1 TABLET BY MOUTH EVERY DAY BEFORE BREAKFAST 90 tablet 1   Multiple Vitamin (MULTIVITAMIN) tablet Take 1 tablet by mouth daily.      pantoprazole (PROTONIX) 20 MG tablet TAKE 1 TABLET(20 MG) BY MOUTH DAILY 30 tablet 5   levocetirizine (XYZAL) 5 MG tablet Take 1 tablet by mouth daily.     No current facility-administered medications for this visit.     Objective: Vital signs in last 24 hours: BP 121/78   Pulse 77  Intake/Output from previous day: No intake/output data recorded. Intake/Output this shift: @IOTHISSHIFT @   Physical Exam Vitals reviewed.  Constitutional:      Appearance: Normal appearance.  Cardiovascular:     Rate and Rhythm: Normal rate and regular rhythm.     Heart sounds: Normal heart sounds.  Pulmonary:     Effort: Pulmonary effort is normal. No respiratory distress.     Breath sounds: Normal breath sounds.  Abdominal:     General: Abdomen is flat.     Palpations: Abdomen is soft. There is no mass.     Tenderness: There is no abdominal tenderness.     Hernia: No hernia is present.  Genitourinary:    Comments: Nl external genitalia. Urethral meatus is normal. 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 Bladder is without mass or signficant tenderness. There is a small rectocele and cystocele with mild cervical prolapse. There is no vaginal mucosal atrophy. There is tenderness of the right levator complex but not the left.   Uterus and cervix are unremarkable. No adnexal masses noted.  Musculoskeletal:        General: No swelling or tenderness. Normal range of motion.  Skin:    General: Skin is warm and dry.  Neurological:     General: No focal deficit present.     Mental Status: She is alert and oriented to person, place, and time.  Psychiatric:        Mood and Affect: Mood normal.        Behavior: Behavior normal.     Lab Results:  Results for orders placed or performed in visit on 08/12/23 (from the past 24 hours)  Urinalysis, Routine w reflex microscopic     Status: Abnormal   Collection Time: 08/12/23  3:43 PM  Result  Value Ref Range   Specific Gravity, UA 1.025 1.005 - 1.030   pH, UA 6.0 5.0 - 7.5   Color, UA Yellow Yellow   Appearance Ur Clear Clear   Leukocytes,UA 1+ (A) Negative   Protein,UA Negative Negative/Trace   Glucose, UA Negative Negative   Ketones, UA Negative Negative   RBC, UA Trace (A) Negative   Bilirubin, UA Negative Negative   Urobilinogen, Ur 0.2 0.2 - 1.0 mg/dL   Nitrite, UA Negative Negative   Microscopic Examination See below:    Narrative   Performed at:  [address] - [facility] [address] [place], [place]  [id number] Lab Director: Chinita Pester MT, Phone:  [phone]  Microscopic Examination     Status: Abnormal   Collection Time: 08/12/23  3:43 PM   Urine  Result Value Ref Range   WBC, UA 6-10 (A) 0 - 5 /hpf   RBC, Urine 3-10 (A) 0 - 2 /hpf   Epithelial Cells (non renal) 0-10 0 - 10 /hpf   Bacteria, UA Few (A) None seen/Few   Narrative   Performed at:  [address] - [facility] [address] [place], [place]  [id number] Lab Director: Chinita Pester MT, Phone:  [phone]    BMET No results for input(s): "NA", "K", "CL", "CO2", "GLUCOSE", "BUN", "CREATININE", "CALCIUM" in the last 72 hours. PT/INR No results for input(s): "LABPROT", "INR" in the last 72 hours. ABG No results for input(s): "PHART", "HCO3" in the last 72 hours.  Invalid input(s): "PCO2", "PO2"  Studies/Results: No results found.   Assessment/Plan: Stress incontinence with moderate urethral hypermobility.  With her limited symptoms, I think physical therapy would be a good first option.  Pelvic pain.  The pain seems to be in the levator complex on the right.   Once again pelvic floor PT could be helpful.     Adrenal adenoma.  She had an adrenal lesion on CT in 2021 and didn't have f/u.  With the microhematuria of 3-10 RBC's today I think a repeat CT hematuria study would be appropriate.      Orders Placed This Encounter  Procedures   Microscopic Examination   CT HEMATURIA  WORKUP    She has had microhematuria and needs the hematuria CT, but she was found to have a 1.5cm left adrenal lesion in 2021 that never was followed up as recommended.  MRI or washout CT was recommended.  If the hematuria CT can do the job, hopefully we can spare her another study.    Standing Status:   Future    Expected Date:   08/13/2023    Expiration Date:   12/10/2023    Reason for Exam (SYMPTOM  OR DIAGNOSIS REQUIRED):   microhematuria.  History of adrenal adenoma    Preferred imaging location?:   [facility]    Radiology Contrast Protocol - do NOT remove file path:   [email or web address]    Is patient pregnant?:   No   Urinalysis, Routine w reflex microscopic   Ambulatory referral to Physical Therapy    Referral Priority:   Routine    Referral Type:   Physical Medicine    Referral Reason:   Specialty Services Required    Requested Specialty:   Physical Therapy    Number of Visits Requested:   1     Return in about 3 months (around 11/09/2023) for with CT results. .    CC: Dr. Cheryll Cockayne and Dr. Myna Hidalgo.      Bjorn Pippin 08/13/2023

## 2023-08-13 LAB — MICROSCOPIC EXAMINATION

## 2023-08-13 LAB — URINALYSIS, ROUTINE W REFLEX MICROSCOPIC
Bilirubin, UA: NEGATIVE
Glucose, UA: NEGATIVE
Ketones, UA: NEGATIVE
Nitrite, UA: NEGATIVE
Protein,UA: NEGATIVE
Specific Gravity, UA: 1.025 (ref 1.005–1.030)
Urobilinogen, Ur: 0.2 mg/dL (ref 0.2–1.0)
pH, UA: 6 (ref 5.0–7.5)

## 2023-08-17 ENCOUNTER — Encounter: Payer: BC Managed Care – PPO | Admitting: Urology

## 2023-08-30 ENCOUNTER — Telehealth: Admitting: Internal Medicine

## 2023-08-30 ENCOUNTER — Encounter: Payer: Self-pay | Admitting: Internal Medicine

## 2023-08-30 DIAGNOSIS — K219 Gastro-esophageal reflux disease without esophagitis: Secondary | ICD-10-CM

## 2023-08-30 DIAGNOSIS — R1011 Right upper quadrant pain: Secondary | ICD-10-CM | POA: Diagnosis not present

## 2023-08-30 MED ORDER — ONDANSETRON 4 MG PO TBDP: 4.0000 mg | ORAL_TABLET | Freq: Three times a day (TID) | ORAL | 0 refills | Status: AC | PRN

## 2023-08-30 NOTE — Assessment & Plan Note (Signed)
 Chronic Taking pantoprazole daily and heartburn has been controlled She is having some nausea and states occasional GERD, right upper quadrant pain I do not think her symptoms are GERD related Continue pantoprazole 20 mg daily Bland diet Zofran 4 mg every 8 hours as needed Sees GI next week

## 2023-08-30 NOTE — Progress Notes (Signed)
 Virtual Visit via Video Note  I connected with Mindy Bright on 08/30/23 at  3:20 PM EST by a video enabled telemedicine application and verified that I am speaking with the correct person using two identifiers.   I discussed the limitations of evaluation and management by telemedicine and the availability of in person appointments. The patient expressed understanding and agreed to proceed.  Present for the visit:  Myself, Dr Cheryll Cockayne, Garen Lah.  The patient is currently at home and I am in the office.    No referring provider.    History of Present Illness: This is an acute visit for abdominal pain.  One month ago she had norovirus.  After that had right sided abdominal pain - dull pain. It is in the RUQ under her ribs.  It radiates to the side a little and she occasionally has shoulder pain.  Had flu A this past weekend.  Has started to have nausea - not sure if it is related to flu, ? GB, other.   GERD has been controlled.   Anytime she eats she gets very nauseous.    Has history of constipation.  Has history of dyskinesia of gallbladder and did see surgery and deferred surgery.  She also has history of GERD.  Eating bland.  Ate yogurt and crackers today - no nausea.    Scheduled for CT hematuria workup 4/3.   Has GI appt next week 3/10.    Review of Systems  Gastrointestinal:  Positive for abdominal pain (right side), constipation (chronic), heartburn and nausea (1-2 hrs after she eats). Negative for diarrhea.      Social History   Socioeconomic History   Marital status: Married    Spouse name: Not on file   Number of children: 2   Years of education: Not on file   Highest education level: Bachelor's degree (e.g., BA, AB, BS)  Occupational History   Occupation: PE Runner, broadcasting/film/video in [place]  Tobacco Use   Smoking status: Former    Current packs/day: 0.00    Average packs/day: 0.3 packs/day for 3.0 years (0.8 ttl pk-yrs)    Types: Cigarettes    Start date: 07/15/1999     Quit date: 07/14/2002    Years since quitting: 21.1   Smokeless tobacco: Never  Vaping Use   Vaping status: Never Used  Substance and Sexual Activity   Alcohol use: Not Currently    Comment: not now   Drug use: No   Sexual activity: Yes    Birth control/protection: Surgical    Comment: vasectomy   Other Topics Concern   Not on file  Social History Narrative   Not on file   Social Drivers of Health   Financial Resource Strain: Low Risk  (01/04/2023)   Overall Financial Resource Strain (CARDIA)    Difficulty of Paying Living Expenses: Not hard at all  Food Insecurity: No Food Insecurity (01/04/2023)   Hunger Vital Sign    Worried About Running Out of Food in the Last Year: Never true    Ran Out of Food in the Last Year: Never true  Transportation Needs: No Transportation Needs (01/04/2023)   PRAPARE - Administrator, Civil Service (Medical): No    Lack of Transportation (Non-Medical): No  Physical Activity: Insufficiently Active (01/04/2023)   Exercise Vital Sign    Days of Exercise per Week: 3 days    Minutes of Exercise per Session: 20 min  Stress: No Stress Concern Present (01/04/2023)  [employer] of Occupational Health - Occupational Stress Questionnaire    Feeling of Stress : Not at all  Social Connections: Socially Integrated (01/04/2023)   Social Connection and Isolation Panel [NHANES]    Frequency of Communication with Friends and Family: More than three times a week    Frequency of Social Gatherings with Friends and Family: More than three times a week    Attends Religious Services: 1 to 4 times per year    Active Member of [employer] or Organizations: Yes    Attends Banker Meetings: 1 to 4 times per year    Marital Status: Married     Observations/Objective: Appears well in NAD   Assessment and Plan:  See Problem List for Assessment and Plan of chronic medical problems.   Follow Up Instructions:    I discussed the assessment and  treatment plan with the patient. The patient was provided an opportunity to ask questions and all were answered. The patient agreed with the plan and demonstrated an understanding of the instructions.   The patient was advised to call back or seek an in-person evaluation if the symptoms worsen or if the condition fails to improve as anticipated.    Pincus Sanes, MD

## 2023-08-30 NOTE — Assessment & Plan Note (Signed)
 Acute Having right upper quadrant pain and radiates slightly to the side.  Also some right shoulder pain Having nausea Has a history of gallbladder issues and has seen surgery and had cholecystectomy scheduled twice and canceled Possible gallbladder cause Has GERD, but states that has been controlled-taking pantoprazole daily Gastritis, duodenitis possible but seem less likely  will order ultrasound of abdomen to recheck gallbladder Zofran as needed Continue bland diet Sees GI next week

## 2023-08-30 NOTE — Patient Instructions (Addendum)
     [facility] [phone]     Medications changes include :   zofran 4 mg every 8 hrs as needed     No follow-ups on file.

## 2023-09-03 ENCOUNTER — Ambulatory Visit
Admission: RE | Admit: 2023-09-03 | Discharge: 2023-09-03 | Disposition: A | Discharge status: Home / Self Care | Source: Ambulatory Visit | Attending: Internal Medicine | Admitting: Internal Medicine

## 2023-09-03 ENCOUNTER — Telehealth: Payer: Self-pay | Admitting: Internal Medicine

## 2023-09-03 DIAGNOSIS — R1011 Right upper quadrant pain: Secondary | ICD-10-CM

## 2023-09-03 NOTE — Telephone Encounter (Signed)
 Inbound call from patient requesting to speak about prior auth from 08/11/22 endoscopy. States she recently saw a outstanding bill on her mychart. Stated she called her insurance company and was advised that prior auth for the procedure was not submitted until January of 2025. Patient is requesting a call back. Please advise, thank you.

## 2023-09-04 ENCOUNTER — Encounter: Payer: Self-pay | Admitting: Internal Medicine

## 2023-09-06 ENCOUNTER — Ambulatory Visit: Admitting: Gastroenterology

## 2023-09-09 ENCOUNTER — Ambulatory Visit (HOSPITAL_COMMUNITY): Payer: BC Managed Care – PPO | Admitting: Physical Therapy

## 2023-09-10 ENCOUNTER — Ambulatory Visit: Payer: BC Managed Care – PPO | Admitting: Urology

## 2023-09-30 ENCOUNTER — Ambulatory Visit (HOSPITAL_COMMUNITY)
Admission: RE | Admit: 2023-09-30 | Discharge: 2023-09-30 | Disposition: A | Discharge status: Home / Self Care | Payer: BC Managed Care – PPO | Source: Ambulatory Visit | Attending: Urology | Admitting: Urology

## 2023-09-30 DIAGNOSIS — D3502 Benign neoplasm of left adrenal gland: Secondary | ICD-10-CM | POA: Insufficient documentation

## 2023-09-30 DIAGNOSIS — R3129 Other microscopic hematuria: Secondary | ICD-10-CM | POA: Insufficient documentation

## 2023-09-30 MED ORDER — IOHEXOL 300 MG/ML  SOLN
125.0000 mL | Freq: Once | INTRAMUSCULAR | Status: AC | PRN
  Administered 2023-09-30: 125 mL via INTRAVENOUS

## 2023-10-18 ENCOUNTER — Ambulatory Visit: Admitting: Physician Assistant

## 2023-10-25 ENCOUNTER — Ambulatory Visit: Admitting: Physician Assistant

## 2023-11-03 ENCOUNTER — Ambulatory Visit (HOSPITAL_COMMUNITY): Admitting: Physical Therapy

## 2023-11-10 ENCOUNTER — Encounter (HOSPITAL_COMMUNITY): Admitting: Physical Therapy

## 2023-11-11 ENCOUNTER — Encounter (HOSPITAL_COMMUNITY): Admitting: Physical Therapy

## 2023-11-11 ENCOUNTER — Ambulatory Visit: Payer: BC Managed Care – PPO | Admitting: Urology

## 2023-11-17 ENCOUNTER — Encounter (HOSPITAL_COMMUNITY): Admitting: Physical Therapy

## 2023-11-18 ENCOUNTER — Encounter (HOSPITAL_COMMUNITY): Admitting: Physical Therapy

## 2023-11-23 ENCOUNTER — Other Ambulatory Visit (HOSPITAL_COMMUNITY): Payer: Self-pay | Admitting: Adult Health

## 2023-11-23 DIAGNOSIS — Z1231 Encounter for screening mammogram for malignant neoplasm of breast: Secondary | ICD-10-CM

## 2023-11-24 ENCOUNTER — Encounter (HOSPITAL_COMMUNITY): Admitting: Physical Therapy

## 2023-11-24 ENCOUNTER — Other Ambulatory Visit: Payer: Self-pay | Admitting: Internal Medicine

## 2023-11-25 ENCOUNTER — Encounter (HOSPITAL_COMMUNITY): Admitting: Physical Therapy

## 2023-12-01 ENCOUNTER — Encounter (HOSPITAL_COMMUNITY): Admitting: Physical Therapy

## 2023-12-02 ENCOUNTER — Encounter (HOSPITAL_COMMUNITY): Admitting: Physical Therapy

## 2023-12-07 ENCOUNTER — Ambulatory Visit (HOSPITAL_COMMUNITY): Attending: Urology | Admitting: Physical Therapy

## 2023-12-07 ENCOUNTER — Other Ambulatory Visit: Payer: Self-pay

## 2023-12-07 DIAGNOSIS — N393 Stress incontinence (female) (male): Secondary | ICD-10-CM | POA: Insufficient documentation

## 2023-12-07 DIAGNOSIS — R102 Pelvic and perineal pain: Secondary | ICD-10-CM | POA: Insufficient documentation

## 2023-12-07 NOTE — Therapy (Signed)
 OUTPATIENT PHYSICAL THERAPY FEMALE PELVIC EVALUATION   Patient Name: Mindy Bright MRN: [id number] DOB:[birth year], 43 y.o., female Today's Date: 12/07/2023  END OF SESSION:  PT End of Session - 12/07/23 0959     Visit Number 1    Number of Visits 4    Date for PT Re-Evaluation 01/06/24    Authorization Type BCBS    PT Start Time 0915    PT Stop Time 0959    PT Time Calculation (min) 44 min    Activity Tolerance Patient tolerated treatment well    Behavior During Therapy [facility] for tasks assessed/performed             Past Medical History:  Diagnosis Date   Abnormal Pap smear    Anemia    Anxiety    Gall bladder disease    GERD (gastroesophageal reflux disease)    H/O hiatal hernia    Irregular periods 04/30/2014   Patient desires pregnancy 12/11/2013   Pregnant 10/26/2014   Reflux 11/09/2014   Thyroid  disease    Unspecified symptom associated with female genital organs 12/11/2013   Vaginal discharge 12/11/2013   Vaginal Pap smear, abnormal    >10 years ago, no treatment   Past Surgical History:  Procedure Laterality Date   COLONOSCOPY  07/2019   UPPER GASTROINTESTINAL ENDOSCOPY     UPPER GI ENDOSCOPY  07/2019   WISDOM TOOTH EXTRACTION     Patient Active Problem List   Diagnosis Date Noted   Encounter for well woman exam with routine gynecological exam 01/04/2023   Allergic rhinitis 09/17/2022   Leukocytosis 07/03/2022   Neck pain on right side 07/03/2022   Globus sensation 06/10/2022   Belching 06/10/2022   Xerostomia 05/20/2022   Increased thirst 05/20/2022   Constipation 03/31/2022   Rectocele 03/31/2022   First degree uterine prolapse 03/31/2022   Prediabetes 02/06/2022   Nail discoloration 02/06/2022   Eye irritation 02/06/2022   Arthralgia of right temporomandibular joint 02/06/2022   Situational anxiety 10/01/2021   Pelvic relaxation 10/01/2021   Encounter for screening fecal occult blood testing 10/01/2021   Routine cervical smear 10/01/2021    Urinary frequency 03/19/2021   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   Paresthesias 02/29/2020   RUQ pain 09/27/2019   Family history of diabetes mellitus (DM) 01/05/2019   History of abnormal cervical Pap smear 12/25/2016   Hypothyroidism 09/06/2016   GERD (gastroesophageal reflux disease) 11/09/2014    PCP: Oma Bias  REFERRING PROVIDER: Homero Luster, MD  REFERRING DIAG: N39.3 (ICD-10-CM) - Stress incontinence, female R10.2 (ICD-10-CM) - Pelvic pain  THERAPY DIAG:  Stress incontinence (female) (female)  Rationale for Evaluation and Treatment: N39.3 (ICD-10-CM) - Stress incontinence, female R10.2 (ICD-10-CM) - Pelvic pain  ONSET DATE: 1/25  SUBJECTIVE:  SUBJECTIVE STATEMENT:   Pt states that she started having some incontinence about a couple of years, however within the last six months it has increased.  States that she has pain on the Rt side of her lower abdominal area following intercourse    PAIN:  Are you having pain? Yes NPRS scale: 6/10 Pain location: Right  Pain type: dull Pain description: intermittent   Aggravating factors: following intercourse Relieving factors: time   PRECAUTIONS: None  RED FLAGS: None   WEIGHT BEARING RESTRICTIONS: No  FALLS:  Has patient fallen in last 6 months? No  OCCUPATION: PE teacher  ACTIVITY LEVEL : high, couch volleyball     PATIENT GOALS: To become dry and less pain  PERTINENT HISTORY:  BOWEL MOVEMENT: Pain with bowel movement: No Type of bowel movement:Strain   Fully empty rectum: Yes:   Leakage: No Fiber supplement/laxative Yes   URINATION: Pain with urination: No Fully empty bladder: Yes:   Stream: Strong Urgency: No Frequency: voids every four hours.  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 PALPATION:  Pelvic Alignment: Pelvis is malaligned  Lt pelvis is posteriorly rotated.    Abdominal: 5/5      TODAY'S TREATMENT:                                                                                                                              DATE:   EVAL 12/07/23 Muscle energy techniques for SI correction with positive results.  Single leg bridge with Lt x 5 Knee to chest with Rt x 5 Kegal:  quick flick contract 2 seconds rest 4 x 10              Long hold: contract 15 seconds rest 30 x 10   PATIENT EDUCATION:  Education details: HEP Person educated: Patient Education method: Programmer, multimedia, Facilities manager, and Handouts Education comprehension: verbalized understanding and returned demonstration  HOME EXERCISE PROGRAM: Access Code: [email or web address] URL: [email or web address] Date: 12/07/2023 Prepared by: Leodis Rainwater  Exercises - Single Leg Bridge  - 1 x daily - 7 x weekly - 1 sets - 5 reps - 3-5 seconds  hold - Supine Single Knee to Chest Stretch  - 1 x daily - 7 x weekly - 1 sets - 5 reps - 5"  hold  ASSESSMENT:  CLINICAL IMPRESSION: Patient is a 43 y.o. female who was seen today for physical therapy evaluation and treatment for stress incontinence and pelvic pain.   Evaluation demonstrates weakened slow twitch mm fibers in the pelvic area as well as SI dysfunction.  Pt will benefit from skilled PT to address these issues and return normal posture and allow pt to exercise without having incontinence issues.  OBJECTIVE IMPAIRMENTS: decreased strength and postural dysfunction.   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   REHAB POTENTIAL: Good  CLINICAL DECISION MAKING: Stable/uncomplicated  EVALUATION COMPLEXITY: Low   GOALS: Goals reviewed with patient? No  SHORT TERM GOALS: Target date: 12/21/23  PT to be I in HEP in order to decrease her pelvic pain to no greater than a 2/10 Baseline: Goal status: INITIAL  2.  PT pelvic to be aligned Baseline:  Goal status: INITIAL   LONG TERM GOALS: Target date: 01/04/24  PT to be I in an advanced HEP to allow pt to be able to jump without noting any incontinence  Baseline:  Goal status: INITIAL  2.  PT pelvic pain to be no greater than a 1/10 Baseline:  Goal status: INITIAL   PLAN:  PT FREQUENCY: 1x/week  PT DURATION: 4 weeks  PLANNED INTERVENTIONS: 97110-Therapeutic exercises, 97535- Self Care, and 16109- Manual therapy  PLAN FOR NEXT SESSION: Check SI, increase kegal hold, begin mat exercises with abdominal contraction.   Leodis Rainwater, PT CLT [phone]  12/07/2023, 9:59 AM    Managed Medicaid Authorization Request  Visit Dx Codes: N39.3  Functional Tool Score: pt unable to jump without voiding   For all possible CPT codes, reference the Planned Interventions line above.     Check all conditions that are expected to impact treatment: {Conditions expected to impact treatment:None of these apply

## 2023-12-08 ENCOUNTER — Encounter (HOSPITAL_COMMUNITY): Admitting: Physical Therapy

## 2023-12-09 ENCOUNTER — Ambulatory Visit (HOSPITAL_COMMUNITY): Admitting: Physical Therapy

## 2023-12-09 ENCOUNTER — Encounter (HOSPITAL_COMMUNITY): Admitting: Physical Therapy

## 2023-12-09 DIAGNOSIS — N393 Stress incontinence (female) (male): Secondary | ICD-10-CM | POA: Diagnosis not present

## 2023-12-09 DIAGNOSIS — R102 Pelvic and perineal pain: Secondary | ICD-10-CM

## 2023-12-09 NOTE — Therapy (Signed)
 OUTPATIENT PHYSICAL THERAPY FEMALE PELVIC Treatment   Patient Name: Mindy Bright MRN: [id number] DOB:[birth year], 43 y.o., female Today's Date: 12/09/2023  END OF SESSION:  PT End of Session - 12/09/23 1107     Visit Number 2    Number of Visits 4    Date for PT Re-Evaluation 01/06/24    Authorization Type BCBS    PT Start Time 1017    PT Stop Time 1101    PT Time Calculation (min) 44 min    Activity Tolerance Patient tolerated treatment well    Behavior During Therapy [facility] for tasks assessed/performed           Past Medical History:  Diagnosis Date   Abnormal Pap smear    Anemia    Anxiety    Gall bladder disease    GERD (gastroesophageal reflux disease)    H/O hiatal hernia    Irregular periods 04/30/2014   Patient desires pregnancy 12/11/2013   Pregnant 10/26/2014   Reflux 11/09/2014   Thyroid  disease    Unspecified symptom associated with female genital organs 12/11/2013   Vaginal discharge 12/11/2013   Vaginal Pap smear, abnormal    >10 years ago, no treatment   Past Surgical History:  Procedure Laterality Date   COLONOSCOPY  07/2019   UPPER GASTROINTESTINAL ENDOSCOPY     UPPER GI ENDOSCOPY  07/2019   WISDOM TOOTH EXTRACTION     Patient Active Problem List   Diagnosis Date Noted   Encounter for well woman exam with routine gynecological exam 01/04/2023   Allergic rhinitis 09/17/2022   Leukocytosis 07/03/2022   Neck pain on right side 07/03/2022   Globus sensation 06/10/2022   Belching 06/10/2022   Xerostomia 05/20/2022   Increased thirst 05/20/2022   Constipation 03/31/2022   Rectocele 03/31/2022   First degree uterine prolapse 03/31/2022   Prediabetes 02/06/2022   Nail discoloration 02/06/2022   Eye irritation 02/06/2022   Arthralgia of right temporomandibular joint 02/06/2022   Situational anxiety 10/01/2021   Pelvic relaxation 10/01/2021   Encounter for screening fecal occult blood testing 10/01/2021   Routine cervical smear 10/01/2021    Urinary frequency 03/19/2021   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   Paresthesias 02/29/2020   RUQ pain 09/27/2019   Family history of diabetes mellitus (DM) 01/05/2019   History of abnormal cervical Pap smear 12/25/2016   Hypothyroidism 09/06/2016   GERD (gastroesophageal reflux disease) 11/09/2014    PCP: Oma Bias  REFERRING PROVIDER: Homero Luster, MD  REFERRING DIAG: N39.3 (ICD-10-CM) - Stress incontinence, female R10.2 (ICD-10-CM) - Pelvic pain  THERAPY DIAG:  Stress incontinence (female) (female)  Pelvic pain  Rationale for Evaluation and Treatment: N39.3 (ICD-10-CM) - Stress incontinence, female R10.2 (ICD-10-CM) - Pelvic pain  ONSET DATE: 1/25  SUBJECTIVE:  SUBJECTIVE STATEMENT:   Pt states that she has been completing the exercises and has no questions.    PAIN:  Are you having pain? Yes NPRS scale: 0 at this time but pain comes and goes  /10 Pain location: Right  Pain type: dull Pain description: intermittent   Aggravating factors: following intercourse Relieving factors: time   PRECAUTIONS: None  RED FLAGS: None   WEIGHT BEARING RESTRICTIONS: No  FALLS:  Has patient fallen in last 6 months? No  OCCUPATION: PE teacher  ACTIVITY LEVEL : high, couch volleyball     PATIENT GOALS: To become dry and less pain  PERTINENT HISTORY:  BOWEL MOVEMENT: Pain with bowel movement: No Type of bowel movement:Strain   Fully empty rectum: Yes:   Leakage: No Fiber supplement/laxative Yes   URINATION: Pain with urination: No Fully empty bladder: Yes:   Stream: Strong Urgency: No Frequency: voids every four hours.  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 PALPATION:  Pelvic Alignment: Pelvis is malaligned  Lt pelvis is posteriorly rotated.    Abdominal: 5/5      TODAY'S TREATMENT:                                                                                                                              DATE:    12/09/23 SI checked with malalignment.  Muscle energy techniques for SI correction with positive results.  Heel slide with abdominal contraction x 5 Prone hip extension with mm contraction x 5 Side lying abduction x 10 with abdominal contraction  Kegal:  quick flick contract 2 seconds rest 4 x 10              Long hold: contract 20 seconds rest 40 x 10   PATIENT EDUCATION:  Education details: HEP Person educated: Patient Education method: Programmer, multimedia, Facilities manager, and Handouts Education comprehension: verbalized understanding and returned demonstration  HOME EXERCISE PROGRAM: Access Code: [email or web address] URL: [email or web address] Date: 12/07/2023 Prepared by: Leodis Rainwater  Exercises - Single Leg Bridge  - 1 x daily - 7 x weekly - 1 sets - 5 reps - 3-5 seconds  hold - Supine Single Knee to Chest Stretch  - 1 x daily - 7 x weekly - 1 sets - 5 reps - 5  hold  ASSESSMENT:  CLINICAL IMPRESSION: Patient is a 43 y.o. female who was seen today for physical therapy  for stress incontinence and pelvic pain.  Pt continues to demonstrates weakened slow twitch mm fibers in the pelvic area as well as SI dysfunction.  Pt will continue to benefit from skilled PT to address these issues and return normal posture and allow pt to exercise without having incontinence issues.  OBJECTIVE IMPAIRMENTS: decreased strength and postural dysfunction.   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   REHAB POTENTIAL: Good  CLINICAL DECISION MAKING: Stable/uncomplicated  EVALUATION COMPLEXITY: Low   GOALS: Goals reviewed with patient? No  SHORT TERM GOALS: Target date: 12/21/23  PT to be I in HEP in order to decrease her pelvic pain to no greater than a 2/10 Baseline: Goal status: in progress  2.  PT pelvic to be aligned Baseline:  Goal status: in progress   LONG TERM GOALS: Target date: 01/04/24  PT to be I in an advanced HEP to allow pt to be able to jump without noting any incontinence  Baseline: Goal status: in progress  2.  PT pelvic pain to be no greater than a 1/10 Baseline:  Goal status: in progress   PLAN:  PT FREQUENCY: 1x/week  PT DURATION: 4 weeks  PLANNED INTERVENTIONS: 97110-Therapeutic exercises, 97535- Self Care, and 96045- Manual therapy  PLAN FOR NEXT SESSION: Check SI, increase kegal hold, begin quadriped and t band  exercises with abdominal contraction.   Leodis Rainwater, PT CLT [phone]  12/09/2023, 11:08 AM

## 2023-12-15 ENCOUNTER — Encounter (HOSPITAL_COMMUNITY): Admitting: Physical Therapy

## 2023-12-16 ENCOUNTER — Encounter: Payer: Self-pay | Admitting: Urology

## 2023-12-16 ENCOUNTER — Encounter (HOSPITAL_COMMUNITY): Admitting: Physical Therapy

## 2023-12-16 ENCOUNTER — Ambulatory Visit: Admitting: Urology

## 2023-12-16 VITALS — BP 108/63 | HR 76

## 2023-12-16 DIAGNOSIS — D3502 Benign neoplasm of left adrenal gland: Secondary | ICD-10-CM

## 2023-12-16 DIAGNOSIS — R3129 Other microscopic hematuria: Secondary | ICD-10-CM

## 2023-12-16 DIAGNOSIS — N393 Stress incontinence (female) (male): Secondary | ICD-10-CM

## 2023-12-16 DIAGNOSIS — R350 Frequency of micturition: Secondary | ICD-10-CM

## 2023-12-16 DIAGNOSIS — R102 Pelvic and perineal pain: Secondary | ICD-10-CM | POA: Diagnosis not present

## 2023-12-16 DIAGNOSIS — N200 Calculus of kidney: Secondary | ICD-10-CM

## 2023-12-16 LAB — URINALYSIS, ROUTINE W REFLEX MICROSCOPIC
Bilirubin, UA: NEGATIVE
Glucose, UA: NEGATIVE
Ketones, UA: NEGATIVE
Leukocytes,UA: NEGATIVE
Nitrite, UA: NEGATIVE
Protein,UA: NEGATIVE
RBC, UA: NEGATIVE
Specific Gravity, UA: 1.03 (ref 1.005–1.030)
Urobilinogen, Ur: 0.2 mg/dL (ref 0.2–1.0)
pH, UA: 6 (ref 5.0–7.5)

## 2023-12-16 NOTE — Progress Notes (Signed)
 Subjective: 1. Urinary frequency   2. Microhematuria   3. Stress incontinence, female   4. Pelvic pain   5. Adrenal adenoma, left   6. Renal stones      12/16/23: Mindy Bright returns today in f/u.  She had a CT in April for microhematuria that showed a punctate right renal stone and a benign adrenal adenoma along with increased stool burden but no other GU findings.  She has some prolapse with SUI and PT was recommended.   She started that 2 weeks ago.  Her UA is clear today.  Her incontinence is better.    08/12/23: Mindy Bright is a 43 yo female who was seeing her Gyn for UTI like symptoms but she was not felt to have infection.  She has some postcoital dysparunia and some increased leakage after sexual activity.  She has mild but new SUI and had a significant amount of leakage jumping playing volley ball.  She had had some right lower abdominal pain and frequency in the fall.  She had an US  that was unremarkable.  She has had no gross hematuria.   She has had no nausea.  She has no history stones.  She has had no GU surgery.  She G2P2 with NVD's.  She has no prolapse symptoms but was told that she might have early prolapse.  She has some increased heaviness of her periods but she is regular.  She has some constipation.  Her UA has 6-10 WBC's 3-10 RBC's and a few bacteria.  She is about 2 days since her last period.  She had some flank pain in the past on the right as well.  ROS:  Review of Systems  Gastrointestinal:  Positive for constipation and heartburn.  All other systems reviewed and are negative.   Allergies  Allergen Reactions   Penicillins Swelling    Has patient had a PCN reaction causing immediate rash, facial/tongue/throat swelling, SOB or lightheadedness with hypotension: Yes Has patient had a PCN reaction causing severe rash involving mucus membranes or skin necrosis: No Has patient had a PCN reaction that required hospitalization Yes Has patient had a PCN reaction occurring within  the last 10 years: No If all of the above answers are NO, then may proceed with Cephalosporin use.    Past Medical History:  Diagnosis Date   Abnormal Pap smear    Anemia    Anxiety    Gall bladder disease    GERD (gastroesophageal reflux disease)    H/O hiatal hernia    Irregular periods 04/30/2014   Patient desires pregnancy 12/11/2013   Pregnant 10/26/2014   Reflux 11/09/2014   Thyroid  disease    Unspecified symptom associated with female genital organs 12/11/2013   Vaginal discharge 12/11/2013   Vaginal Pap smear, abnormal    >10 years ago, no treatment    Past Surgical History:  Procedure Laterality Date   COLONOSCOPY  07/2019   UPPER GASTROINTESTINAL ENDOSCOPY     UPPER GI ENDOSCOPY  07/2019   WISDOM TOOTH EXTRACTION      Social History   Socioeconomic History   Marital status: Married    Spouse name: Not on file   Number of children: 2   Years of education: Not on file   Highest education level: Bachelor's degree (e.g., BA, AB, BS)  Occupational History   Occupation: PE teacher in [place]  Tobacco Use   Smoking status: Former    Current packs/day: 0.00    Average packs/day: 0.3 packs/day for 3.0  years (0.8 ttl pk-yrs)    Types: Cigarettes    Start date: 07/15/1999    Quit date: 07/14/2002    Years since quitting: 21.4   Smokeless tobacco: Never  Vaping Use   Vaping status: Never Used  Substance and Sexual Activity   Alcohol use: Not Currently    Comment: not now   Drug use: No   Sexual activity: Yes    Birth control/protection: Surgical    Comment: vasectomy   Other Topics Concern   Not on file  Social History Narrative   Not on file   Social Drivers of Health   Financial Resource Strain: Low Risk  (01/04/2023)   Overall Financial Resource Strain (CARDIA)    Difficulty of Paying Living Expenses: Not hard at all  Food Insecurity: No Food Insecurity (01/04/2023)   Hunger Vital Sign    Worried About Running Out of Food in the Last Year: Never true    Ran  Out of Food in the Last Year: Never true  Transportation Needs: No Transportation Needs (01/04/2023)   PRAPARE - Administrator, Civil Service (Medical): No    Lack of Transportation (Non-Medical): No  Physical Activity: Insufficiently Active (01/04/2023)   Exercise Vital Sign    Days of Exercise per Week: 3 days    Minutes of Exercise per Session: 20 min  Stress: No Stress Concern Present (01/04/2023)   [employer] of Occupational Health - Occupational Stress Questionnaire    Feeling of Stress : Not at all  Social Connections: Socially Integrated (01/04/2023)   Social Connection and Isolation Panel    Frequency of Communication with Friends and Family: More than three times a week    Frequency of Social Gatherings with Friends and Family: More than three times a week    Attends Religious Services: 1 to 4 times per year    Active Member of [employer] or Organizations: Yes    Attends Banker Meetings: 1 to 4 times per year    Marital Status: Married  Catering manager Violence: Not At Risk (01/04/2023)   Humiliation, Afraid, Rape, and Kick questionnaire    Fear of Current or Ex-Partner: No    Emotionally Abused: No    Physically Abused: No    Sexually Abused: No    Family History  Problem Relation Age of Onset   Diabetes Mother    Heart disease Paternal Grandfather    Diabetes Paternal Grandfather    Diabetes Maternal Grandmother    Hypertension Father    Heart disease Maternal Grandfather    Gout Brother    Diabetes Maternal Aunt    Colon cancer Neg Hx    Stomach cancer Neg Hx    Pancreatic cancer Neg Hx    Esophageal cancer Neg Hx    Liver disease Neg Hx    Rectal cancer Neg Hx     Anti-infectives: Anti-infectives (From admission, onward)    None       Current Outpatient Medications  Medication Sig Dispense Refill   ALPRAZolam  (XANAX ) 0.25 MG tablet Take 1 tablet (0.25 mg total) by mouth daily as needed for anxiety. 10 tablet 0   levocetirizine  (XYZAL) 5 MG tablet Take 1 tablet by mouth daily.     levothyroxine  (SYNTHROID ) 50 MCG tablet TAKE 1 TABLET BY MOUTH EVERY DAY BEFORE BREAKFAST 90 tablet 1   Multiple Vitamin (MULTIVITAMIN) tablet Take 1 tablet by mouth daily.     ondansetron  (ZOFRAN -ODT) 4 MG disintegrating tablet Take 1  tablet (4 mg total) by mouth every 8 (eight) hours as needed for nausea or vomiting. 20 tablet 0   pantoprazole  (PROTONIX ) 20 MG tablet TAKE 1 TABLET(20 MG) BY MOUTH DAILY 30 tablet 5   ipratropium (ATROVENT) 0.06 % nasal spray USE 1 SPRAY IN EACH NOSTRIL THREE TIMES DAILY AS DIRECTED (Patient not taking: Reported on 12/16/2023)     No current facility-administered medications for this visit.     Objective: Vital signs in last 24 hours: BP 108/63   Pulse 76   Intake/Output from previous day: No intake/output data recorded. Intake/Output this shift: @IOTHISSHIFT @   Physical Exam  Lab Results:  Results for orders placed or performed in visit on 12/16/23 (from the past 24 hours)  Urinalysis, Routine w reflex microscopic     Status: None   Collection Time: 12/16/23 10:54 AM  Result Value Ref Range   Specific Gravity, UA 1.030 1.005 - 1.030   pH, UA 6.0 5.0 - 7.5   Color, UA Yellow Yellow   Appearance Ur Clear Clear   Leukocytes,UA Negative Negative   Protein,UA Negative Negative/Trace   Glucose, UA Negative Negative   Ketones, UA Negative Negative   RBC, UA Negative Negative   Bilirubin, UA Negative Negative   Urobilinogen, Ur 0.2 0.2 - 1.0 mg/dL   Nitrite, UA Negative Negative   Microscopic Examination Comment    Narrative   Performed at:  [address] [facility] [address] [place], [place]  [id number] Lab Director: Liliana Regulus MT, Phone:  [phone]     BMET No results for input(s): NA, K, CL, CO2, GLUCOSE, BUN, CREATININE, CALCIUM in the last 72 hours. PT/INR No results for input(s): LABPROT, INR in the last 72 hours. ABG No results for input(s):  PHART, HCO3 in the last 72 hours.  Invalid input(s): PCO2, PO2  Studies/Results: No results found.   Assessment/Plan: Stress incontinence with moderate urethral hypermobility.  With her limited symptoms, I think physical therapy would be a good first option.  Pelvic pain.  The pain seems to be in the levator complex on the right.   Once again pelvic floor PT could be helpful.     Adrenal adenoma.  She had an adrenal lesion on CT in 2021 and didn't have f/u.  With the microhematuria of 3-10 RBC's today I think a repeat CT hematuria study would be appropriate.      Orders Placed This Encounter  Procedures   Urinalysis, Routine w reflex microscopic     Return if symptoms worsen or fail to improve.    CC: Dr. Oma Bias and Dr. Jennifer Ozan.      Kanesha Cadle 12/17/2023

## 2023-12-22 ENCOUNTER — Encounter (HOSPITAL_COMMUNITY): Admitting: Physical Therapy

## 2023-12-23 ENCOUNTER — Encounter (HOSPITAL_COMMUNITY): Admitting: Physical Therapy

## 2023-12-23 ENCOUNTER — Ambulatory Visit (HOSPITAL_COMMUNITY): Admitting: Physical Therapy

## 2023-12-23 DIAGNOSIS — N393 Stress incontinence (female) (male): Secondary | ICD-10-CM

## 2023-12-23 DIAGNOSIS — R102 Pelvic and perineal pain: Secondary | ICD-10-CM | POA: Diagnosis not present

## 2023-12-23 NOTE — Therapy (Signed)
 OUTPATIENT PHYSICAL THERAPY FEMALE PELVIC Treatment/Discharge   Patient Name: Mindy Bright MRN: [id number] DOB:1981-06-22, 43 y.o., female Today's Date: 12/23/2023   PHYSICAL THERAPY DISCHARGE SUMMARY  Visits from Start of Care: 3  Current functional level related to goals / functional outcomes: PT has no more pain and improved incontinence   Remaining deficits: Pt has not tried jumping yet   Education / Equipment: HEP   Patient agrees to discharge. Patient goals were partially met. Patient is being discharged due to being pleased with the current functional level.  END OF SESSION:  PT End of Session - 12/23/23 1512     Visit Number 3    Number of Visits 3    Date for PT Re-Evaluation 01/06/24    Authorization Type BCBS    PT Start Time 1346    PT Stop Time 1432    PT Time Calculation (min) 46 min    Activity Tolerance Patient tolerated treatment well    Behavior During Therapy WFL for tasks assessed/performed            Past Medical History:  Diagnosis Date   Abnormal Pap smear    Anemia    Anxiety    Gall bladder disease    GERD (gastroesophageal reflux disease)    H/O hiatal hernia    Irregular periods 04/30/2014   Patient desires pregnancy 12/11/2013   Pregnant 10/26/2014   Reflux 11/09/2014   Thyroid  disease    Unspecified symptom associated with female genital organs 12/11/2013   Vaginal discharge 12/11/2013   Vaginal Pap smear, abnormal    >10 years ago, no treatment   Past Surgical History:  Procedure Laterality Date   COLONOSCOPY  07/2019   UPPER GASTROINTESTINAL ENDOSCOPY     UPPER GI ENDOSCOPY  07/2019   WISDOM TOOTH EXTRACTION     Patient Active Problem List   Diagnosis Date Noted   Encounter for well woman exam with routine gynecological exam 01/04/2023   Allergic rhinitis 09/17/2022   Leukocytosis 07/03/2022   Neck pain on right side 07/03/2022   Globus sensation 06/10/2022   Belching 06/10/2022   Xerostomia 05/20/2022   Increased  thirst 05/20/2022   Constipation 03/31/2022   Rectocele 03/31/2022   First degree uterine prolapse 03/31/2022   Prediabetes 02/06/2022   Nail discoloration 02/06/2022   Eye irritation 02/06/2022   Arthralgia of right temporomandibular joint 02/06/2022   Situational anxiety 10/01/2021   Pelvic relaxation 10/01/2021   Encounter for screening fecal occult blood testing 10/01/2021   Routine cervical smear 10/01/2021   Urinary frequency 03/19/2021   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   Paresthesias 02/29/2020   RUQ pain 09/27/2019   Family history of diabetes mellitus (DM) 01/05/2019   History of abnormal cervical Pap smear 12/25/2016   Hypothyroidism 09/06/2016   GERD (gastroesophageal reflux disease) 11/09/2014    PCP: Glade Hope  REFERRING PROVIDER: Watt Rush, MD  REFERRING DIAG: N39.3 (ICD-10-CM) - Stress incontinence, female R10.2 (ICD-10-CM) - Pelvic pain  THERAPY DIAG:  Stress incontinence (female) (female)  Pelvic pain  Rationale for Evaluation and Treatment: N39.3 (ICD-10-CM) - Stress incontinence, female R10.2 (ICD-10-CM) - Pelvic pain  ONSET DATE: 1/25  SUBJECTIVE:  SUBJECTIVE STATEMENT:   Pt states that she has been completing the exercises and has no questions.  Pt states volleyball starts in two weeks. She has not had any leakage since she started the exercises.    PAIN:  Are you having pain? No NPRS scale: 0 at this time but pain comes and goes  /10 Pain location: Right  Pain type: dull Pain description: intermittent   Aggravating factors: following intercourse Relieving factors: time   PRECAUTIONS: None  RED FLAGS: None   WEIGHT BEARING RESTRICTIONS: No  FALLS:   Has patient fallen in last 6 months? No  OCCUPATION: PE teacher  ACTIVITY LEVEL : high,  couch volleyball     PATIENT GOALS: To become dry and less pain  PERTINENT HISTORY:  BOWEL MOVEMENT: Pain with bowel movement: No Type of bowel movement:Strain   Fully empty rectum: Yes:   Leakage: No Fiber supplement/laxative Yes   URINATION: Pain with urination: No Fully empty bladder: Yes:   Stream: Strong Urgency: No Frequency: voids every four hours.  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 PALPATION:  Pelvic Alignment: Pelvis is malaligned  Lt pelvis is posteriorly rotated.    Abdominal: 5/5      TODAY'S TREATMENT:                                                                                                                              DATE:    12/23/23 SI checked with good alignment. T-band exercises: with red theraband Shoulder flexion x 10 each Shoulder lower horizontal adduction x 10 each (think putt putt) Shoulder D2 PNF pattern(shoulder to opposite hip) x 10 B Shoulder extension B x 10  Shouder retraction x 10 Rows x 10 Extension x 10 Opposite arm/leg x 10  Kegal:  quick flick contract 2 seconds rest 4 x 10              Long hold: contract 20 seconds rest 40 x 10   PATIENT EDUCATION:  Education details: HEP Person educated: Patient Education method: Programmer, multimedia, Facilities manager, and Handouts Education comprehension: verbalized understanding and returned demonstration  HOME EXERCISE PROGRAM: Access Code: [email or web address] URL: [email or web address] Date: 12/07/2023 Prepared by: Montie Metro  Exercises - Single Leg Bridge  - 1 x daily - 7 x weekly - 1 sets - 5 reps - 3-5 seconds  hold - Supine Single Knee to Chest Stretch  - 1 x daily - 7 x weekly - 1 sets - 5 reps  - 5  hold  ASSESSMENT:  CLINICAL IMPRESSION: Patient is a 43 y.o. female who was seen today for physical therapy  for stress incontinence and pelvic pain. Pt SI is in correct alignment today.  Therapist instructed pt in theraband exercises.  Pt is confident in execution of her exercises and feels that she can continue at home.  Pt will be discharged from therapy at this time.   OBJECTIVE IMPAIRMENTS: decreased strength and postural dysfunction.   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   REHAB POTENTIAL: Good  CLINICAL DECISION MAKING: Stable/uncomplicated  EVALUATION COMPLEXITY: Low   GOALS: Goals reviewed with patient? No  SHORT TERM GOALS: Target date: 12/21/23  PT to be I in HEP in order to decrease her pelvic pain to no greater than a 2/10 Baseline: Goal status: met  2.  PT pelvic to be aligned Baseline:  Goal status: met    LONG TERM GOALS: Target date: 01/04/24  PT to be I in an advanced HEP to allow pt to be able to jump without noting any incontinence  Baseline: Goal status: in progress  2.  PT pelvic pain to be no greater than a 1/10 Baseline:  Goal status: met    PLAN:  PT FREQUENCY: 1x/week  PT DURATION: 4 weeks  PLANNED INTERVENTIONS: 97110-Therapeutic exercises, 97535- Self Care, and 02859- Manual therapy  PLAN FOR NEXT SESSION: Discharge Montie Metro, PT CLT [phone]  12/23/2023, 3:12 PM

## 2023-12-24 ENCOUNTER — Ambulatory Visit (HOSPITAL_COMMUNITY)
Admission: RE | Admit: 2023-12-24 | Discharge: 2023-12-24 | Disposition: A | Discharge status: Home / Self Care | Source: Ambulatory Visit | Attending: Adult Health | Admitting: Adult Health

## 2023-12-24 DIAGNOSIS — Z1231 Encounter for screening mammogram for malignant neoplasm of breast: Secondary | ICD-10-CM | POA: Diagnosis not present

## 2023-12-28 ENCOUNTER — Ambulatory Visit: Payer: Self-pay | Admitting: Adult Health

## 2023-12-29 ENCOUNTER — Encounter (HOSPITAL_COMMUNITY): Admitting: Physical Therapy

## 2023-12-30 ENCOUNTER — Encounter (HOSPITAL_COMMUNITY): Admitting: Physical Therapy

## 2024-01-18 ENCOUNTER — Encounter: Payer: Self-pay | Admitting: Internal Medicine

## 2024-01-18 NOTE — Progress Notes (Unsigned)
 Subjective:    Patient ID: Mindy Bright, female    DOB: [birth year], 43 y.o.   MRN: [id number]      HPI Mindy Bright is here for a Physical exam and her chronic medical problems.   Overall doing well.     Medications and allergies reviewed with patient and updated if appropriate.  Current Outpatient Medications on File Prior to Visit  Medication Sig Dispense Refill   ALPRAZolam  (XANAX ) 0.25 MG tablet Take 1 tablet (0.25 mg total) by mouth daily as needed for anxiety. 10 tablet 0   levocetirizine (XYZAL) 5 MG tablet Take 1 tablet by mouth daily.     levothyroxine  (SYNTHROID ) 50 MCG tablet TAKE 1 TABLET BY MOUTH EVERY DAY BEFORE BREAKFAST 90 tablet 1   Multiple Vitamin (MULTIVITAMIN) tablet Take 1 tablet by mouth daily.     ondansetron  (ZOFRAN -ODT) 4 MG disintegrating tablet Take 1 tablet (4 mg total) by mouth every 8 (eight) hours as needed for nausea or vomiting. 20 tablet 0   pantoprazole  (PROTONIX ) 20 MG tablet TAKE 1 TABLET(20 MG) BY MOUTH DAILY 30 tablet 5   No current facility-administered medications on file prior to visit.    Review of Systems  Constitutional:  Negative for fever.  Eyes:  Negative for visual disturbance.  Respiratory:  Negative for cough, shortness of breath and wheezing.   Cardiovascular:  Negative for chest pain, palpitations and leg swelling.  Gastrointestinal:  Positive for constipation (chronic). Negative for abdominal pain, blood in stool and diarrhea.       Controlled gerd - occ  Genitourinary:  Negative for dysuria.  Musculoskeletal:  Positive for arthralgias (mild) and back pain (little).  Skin:  Negative for rash.  Neurological:  Negative for light-headedness and headaches.  Psychiatric/Behavioral:  Negative for dysphoric mood and sleep disturbance. The patient is nervous/anxious.        Objective:   Vitals:   01/19/24 0811  BP: 104/72  Pulse: 86  Temp: 98.6 F (37 C)  SpO2: 97%   Filed Weights   01/19/24 0811  Weight: 113 lb  (51.3 kg)   Body mass index is 20.67 kg/m.  BP Readings from Last 3 Encounters:  01/19/24 104/72  12/16/23 108/63  08/12/23 121/78    Wt Readings from Last 3 Encounters:  01/19/24 113 lb (51.3 kg)  04/28/23 122 lb 3.2 oz (55.4 kg)  04/16/23 120 lb 12.8 oz (54.8 kg)       Physical Exam Constitutional: She appears well-developed and well-nourished. No distress.  HENT:  Head: Normocephalic and atraumatic.  Right Ear: External ear normal. Normal ear canal and TM Left Ear: External ear normal.  Normal ear canal and TM Mouth/Throat: Oropharynx is clear and moist.  Eyes: Conjunctivae normal.  Neck: Neck supple. No tracheal deviation present. No thyromegaly present.  No carotid bruit  Cardiovascular: Normal rate, regular rhythm and normal heart sounds.   No murmur heard.  No edema. Pulmonary/Chest: Effort normal and breath sounds normal. No respiratory distress. She has no wheezes. She has no rales.  Breast: deferred   Abdominal: Soft. She exhibits no distension. There is no tenderness.  Lymphadenopathy: She has no cervical adenopathy.  Skin: Skin is warm and dry. She is not diaphoretic.  Psychiatric: She has a normal mood and affect. Her behavior is normal.     Lab Results  Component Value Date   WBC 9.4 12/08/2022   HGB 13.1 12/08/2022   HCT 39.9 12/08/2022   PLT 343.0 12/08/2022   GLUCOSE  83 12/08/2022   CHOL 183 12/23/2022   TRIG 130.0 12/23/2022   HDL 43.30 12/23/2022   LDLDIRECT 101.0 12/08/2022   LDLCALC 113 (H) 12/23/2022   ALT 15 12/08/2022   AST 16 12/08/2022   NA 137 12/08/2022   K 3.7 12/08/2022   CL 101 12/08/2022   CREATININE 0.83 12/08/2022   BUN 17 12/08/2022   CO2 29 12/08/2022   TSH 2.26 12/08/2022   HGBA1C 5.9 12/08/2022         Assessment & Plan:   Physical exam: Screening blood work  ordered Exercise  PT for tmj/neck, pelvic floor PT, active Weight  normal Substance abuse  none   Reviewed recommended immunizations.   Health  Maintenance  Topic Date Due   Hepatitis B Vaccines (1 of 3 - 19+ 3-dose series) Never done   COVID-19 Vaccine (1 - 2024-25 season) 02/03/2024 (Originally 02/28/2023)   HPV VACCINES (1 - 3-dose SCDM series) 01/18/2025 (Originally 12/07/2007)   Hepatitis C Screening  04/26/2054 (Originally 12/07/1998)   INFLUENZA VACCINE  01/28/2024   MAMMOGRAM  12/23/2025   Cervical Cancer Screening (HPV/Pap Cotest)  10/02/2026   DTaP/Tdap/Td (3 - Td or Tdap) 03/25/2028   HIV Screening  Completed   Meningococcal B Vaccine  Aged Out          See Problem List for Assessment and Plan of chronic medical problems.

## 2024-01-18 NOTE — Patient Instructions (Addendum)

## 2024-01-19 ENCOUNTER — Ambulatory Visit (INDEPENDENT_AMBULATORY_CARE_PROVIDER_SITE_OTHER): Admitting: Internal Medicine

## 2024-01-19 VITALS — BP 104/72 | HR 86 | Temp 98.6°F | Ht 62.0 in | Wt 113.0 lb

## 2024-01-19 DIAGNOSIS — F418 Other specified anxiety disorders: Secondary | ICD-10-CM | POA: Diagnosis not present

## 2024-01-19 DIAGNOSIS — E039 Hypothyroidism, unspecified: Secondary | ICD-10-CM | POA: Diagnosis not present

## 2024-01-19 DIAGNOSIS — Z0000 Encounter for general adult medical examination without abnormal findings: Secondary | ICD-10-CM

## 2024-01-19 DIAGNOSIS — K219 Gastro-esophageal reflux disease without esophagitis: Secondary | ICD-10-CM

## 2024-01-19 DIAGNOSIS — R7303 Prediabetes: Secondary | ICD-10-CM | POA: Diagnosis not present

## 2024-01-19 LAB — COMPREHENSIVE METABOLIC PANEL WITH GFR
ALT: 12 U/L (ref 0–35)
AST: 15 U/L (ref 0–37)
Albumin: 4.8 g/dL (ref 3.5–5.2)
Alkaline Phosphatase: 62 U/L (ref 39–117)
BUN: 21 mg/dL (ref 6–23)
CO2: 27 meq/L (ref 19–32)
Calcium: 9.5 mg/dL (ref 8.4–10.5)
Chloride: 102 meq/L (ref 96–112)
Creatinine, Ser: 0.85 mg/dL (ref 0.40–1.20)
GFR: 84.09 mL/min (ref >60.00)
Glucose, Bld: 94 mg/dL (ref 70–99)
Potassium: 4.1 meq/L (ref 3.5–5.1)
Sodium: 136 meq/L (ref 135–145)
Total Bilirubin: 0.8 mg/dL (ref 0.2–1.2)
Total Protein: 8.1 g/dL (ref 6.0–8.3)

## 2024-01-19 LAB — CBC WITH DIFFERENTIAL/PLATELET
Basophils Absolute: 0.1 K/uL (ref 0.0–0.1)
Basophils Relative: 0.7 % (ref 0.0–3.0)
Eosinophils Absolute: 0.2 K/uL (ref 0.0–0.7)
Eosinophils Relative: 2.3 % (ref 0.0–5.0)
HCT: 37.6 % (ref 36.0–46.0)
Hemoglobin: 12.3 g/dL (ref 12.0–15.0)
Lymphocytes Relative: 28.6 % (ref 12.0–46.0)
Lymphs Abs: 2.2 K/uL (ref 0.7–4.0)
MCHC: 32.6 g/dL (ref 30.0–36.0)
MCV: 79.1 fl (ref 78.0–100.0)
Monocytes Absolute: 0.4 K/uL (ref 0.1–1.0)
Monocytes Relative: 5.5 % (ref 3.0–12.0)
Neutro Abs: 4.9 K/uL (ref 1.4–7.7)
Neutrophils Relative %: 62.9 % (ref 43.0–77.0)
Platelets: 361 K/uL (ref 150.0–400.0)
RBC: 4.76 Mil/uL (ref 3.87–5.11)
RDW: 14.8 % (ref 11.5–15.5)
WBC: 7.8 K/uL (ref 4.0–10.5)

## 2024-01-19 LAB — LIPID PANEL
Cholesterol: 199 mg/dL (ref 0–200)
HDL: 55.5 mg/dL (ref >39.00)
LDL Cholesterol: 118 mg/dL — ABNORMAL HIGH (ref 0–99)
NonHDL: 143.19
Total CHOL/HDL Ratio: 4
Triglycerides: 128 mg/dL (ref 0.0–149.0)
VLDL: 25.6 mg/dL (ref 0.0–40.0)

## 2024-01-19 LAB — TSH: TSH: 3.72 u[IU]/mL (ref 0.35–5.50)

## 2024-01-19 LAB — HEMOGLOBIN A1C: Hgb A1c MFr Bld: 6.3 % (ref 4.6–6.5)

## 2024-01-19 NOTE — Assessment & Plan Note (Signed)
 Chronic  Clinically euthyroid Check tsh and will titrate med dose if needed Continue levothyroxine  50 mcg daily

## 2024-01-19 NOTE — Assessment & Plan Note (Signed)
 Chronic Lab Results  Component Value Date   HGBA1C 5.9 12/08/2022   Check a1c Low sugar / carb diet, which she is not always compliant with Stressed regular exercise

## 2024-01-19 NOTE — Assessment & Plan Note (Signed)
 Chronic Has situational anxiety Controlled Continue alprazolam  0.25 mg daily as needed - has never taken

## 2024-01-19 NOTE — Assessment & Plan Note (Signed)
 Chronic Controlled Continue pantoprazole  20 mg daily Bland diet Zofran  4 mg every 8 hours as needed

## 2024-01-20 ENCOUNTER — Ambulatory Visit: Payer: Self-pay | Admitting: Internal Medicine

## 2024-02-21 ENCOUNTER — Ambulatory Visit: Admitting: Adult Health

## 2024-04-29 DIAGNOSIS — R0981 Nasal congestion: Secondary | ICD-10-CM | POA: Diagnosis not present

## 2024-04-29 DIAGNOSIS — J019 Acute sinusitis, unspecified: Secondary | ICD-10-CM | POA: Diagnosis not present

## 2024-05-02 ENCOUNTER — Encounter: Payer: Self-pay | Admitting: Adult Health

## 2024-05-02 ENCOUNTER — Ambulatory Visit (INDEPENDENT_AMBULATORY_CARE_PROVIDER_SITE_OTHER): Admitting: Adult Health

## 2024-05-02 VITALS — BP 109/72 | HR 76 | Ht 62.0 in | Wt 113.0 lb

## 2024-05-02 DIAGNOSIS — N393 Stress incontinence (female) (male): Secondary | ICD-10-CM

## 2024-05-02 DIAGNOSIS — Z01419 Encounter for gynecological examination (general) (routine) without abnormal findings: Secondary | ICD-10-CM

## 2024-05-02 DIAGNOSIS — N920 Excessive and frequent menstruation with regular cycle: Secondary | ICD-10-CM | POA: Diagnosis not present

## 2024-05-02 DIAGNOSIS — Z1331 Encounter for screening for depression: Secondary | ICD-10-CM

## 2024-05-02 NOTE — Progress Notes (Signed)
 Patient ID: Mindy Bright, female   DOB: 1981/01/12, 43 y.o.   MRN: [id number] History of Present Illness: Mindy Bright is a 43 year old white female, married, G2P2002, in for a well woman gyn exam.     Component Value Date/Time   DIAGPAP  10/01/2021 1336    - Negative for Intraepithelial Lesions or Malignancy (NILM)   DIAGPAP - Benign reactive/reparative changes 10/01/2021 1336   DIAGPAP  07/17/2020 1601    - Negative for intraepithelial lesion or malignancy (NILM)   HPVHIGH Negative 10/01/2021 1336   HPVHIGH Negative 07/17/2020 1601   HPVHIGH Negative 03/29/2019 1642   ADEQPAP  10/01/2021 1336    Satisfactory for evaluation; transformation zone component PRESENT.   ADEQPAP  07/17/2020 1601    Satisfactory for evaluation; transformation zone component PRESENT.   ADEQPAP  03/29/2019 1642    Satisfactory for evaluation; transformation zone component ABSENT.    PCP is Dr Geofm  Current Medications, Allergies, Past Medical History, Past Surgical History, Family History and Social History were reviewed in [employer] electronic medical record.     Review of Systems: Patient denies any headaches, hearing loss, fatigue, blurred vision, shortness of breath, chest pain, abdominal pain, problems with bowel movements, or intercourse. No joint pain or mood swings.  Has some SUI at times doing exercises, has seen urologist  Periods last about 5-6 days, can be heavy, and will change pads every 3 hours, with some clots    Physical Exam:BP 109/72 (BP Location: Left Arm, Patient Position: Sitting, Cuff Size: Normal)   Pulse 76   Ht 5' 2 (1.575 m)   Wt 113 lb (51.3 kg)   LMP 04/26/2024 (Exact Date)   BMI 20.67 kg/m   General:  Well developed, well nourished, no acute distress Skin:  Warm and dry Neck:  Midline trachea, normal thyroid , good ROM, no lymphadenopathy Lungs; Clear to auscultation bilaterally Breast:  No dominant palpable mass, retraction, or nipple discharge Cardiovascular:  Regular rate and rhythm Abdomen:  Soft, non tender, no hepatosplenomegaly Pelvic:  External genitalia is normal in appearance, no lesions.  The vagina is normal in appearance. Urethra has no lesions or masses. The cervix is bulbous.  Uterus is felt to be normal size, shape, and contour.  No adnexal masses or tenderness noted.Bladder is non tender, no masses felt. Rectal: deferred Extremities/musculoskeletal:  No swelling or varicosities noted, no clubbing or cyanosis Psych:  No mood changes, alert and cooperative,seems happy AA is 0 Fall risk is low    05/02/2024    4:03 PM 01/19/2024    8:15 AM 08/30/2023    3:15 PM  Depression screen PHQ 2/9  Decreased Interest 0 0 0  Down, Depressed, Hopeless 0 0 0  PHQ - 2 Score 0 0 0  Altered sleeping 0 0   Tired, decreased energy 0 0   Change in appetite 0 0   Feeling bad or failure about yourself  0 0   Trouble concentrating 0 0   Moving slowly or fidgety/restless 0 0   Suicidal thoughts 0 0   PHQ-9 Score 0 0   Difficult doing work/chores  Not difficult at all        05/02/2024    4:03 PM 01/04/2023    8:38 AM 10/01/2021    1:30 PM 07/17/2020    3:53 PM  GAD 7 : Generalized Anxiety Score  Nervous, Anxious, on Edge 0 0 0 0  Control/stop worrying 0 0 0 0  Worry too much - different  things 0 0 0 0  Trouble relaxing 0 0 0 0  Restless 0 0 0 0  Easily annoyed or irritable 0 0 0 0  Afraid - awful might happen 0 0 0 0  Total GAD 7 Score 0 0 0 0      Upstream - 05/02/24 1600       Pregnancy Intention Screening   Does the patient want to become pregnant in the next year? No    Does the patient's partner want to become pregnant in the next year? No    Would the patient like to discuss contraceptive options today? No      Contraception Wrap Up   Current Method Vasectomy    End Method Vasectomy    Contraception Counseling Provided No          Examination chaperoned by Mindy Salt LPN  Impression and plan:  1. Encounter for well woman  exam with routine gynecological exam (Primary) Pap and physical in 1 year Labs with PCP Mammogram was negative 12/24/23  2. Menorrhagia with regular cycle Periods can last 5-6 days and will change pads every 3 hours Declines BCP   3. SUI (stress urinary incontinence, female) Does exercises

## 2024-06-06 ENCOUNTER — Other Ambulatory Visit: Payer: Self-pay | Admitting: Internal Medicine

## 2024-06-27 ENCOUNTER — Ambulatory Visit: Admitting: Urology

## 2024-07-19 ENCOUNTER — Ambulatory Visit: Admitting: Internal Medicine

## 2024-07-19 ENCOUNTER — Encounter: Payer: Self-pay | Admitting: Internal Medicine

## 2024-07-19 VITALS — BP 102/60 | HR 72 | Temp 98.0°F | Ht 62.0 in | Wt 115.0 lb

## 2024-07-19 DIAGNOSIS — R7303 Prediabetes: Secondary | ICD-10-CM

## 2024-07-19 DIAGNOSIS — N924 Excessive bleeding in the premenopausal period: Secondary | ICD-10-CM | POA: Insufficient documentation

## 2024-07-19 DIAGNOSIS — E039 Hypothyroidism, unspecified: Secondary | ICD-10-CM | POA: Diagnosis not present

## 2024-07-19 LAB — TSH: TSH: 4.75 u[IU]/mL (ref 0.35–5.50)

## 2024-07-19 LAB — FERRITIN: Ferritin: 4.7 ng/mL — ABNORMAL LOW (ref 10.0–291.0)

## 2024-07-19 LAB — CBC WITH DIFFERENTIAL/PLATELET
Basophils Absolute: 0.1 K/uL (ref 0.0–0.1)
Basophils Relative: 0.8 % (ref 0.0–3.0)
Eosinophils Absolute: 0.2 K/uL (ref 0.0–0.7)
Eosinophils Relative: 2.4 % (ref 0.0–5.0)
HCT: 37.5 % (ref 36.0–46.0)
Hemoglobin: 12.4 g/dL (ref 12.0–15.0)
Lymphocytes Relative: 29 % (ref 12.0–46.0)
Lymphs Abs: 2.1 K/uL (ref 0.7–4.0)
MCHC: 32.9 g/dL (ref 30.0–36.0)
MCV: 79.5 fl (ref 78.0–100.0)
Monocytes Absolute: 0.4 K/uL (ref 0.1–1.0)
Monocytes Relative: 6.2 % (ref 3.0–12.0)
Neutro Abs: 4.4 K/uL (ref 1.4–7.7)
Neutrophils Relative %: 61.6 % (ref 43.0–77.0)
Platelets: 331 K/uL (ref 150.0–400.0)
RBC: 4.72 Mil/uL (ref 3.87–5.11)
RDW: 15.1 % (ref 11.5–15.5)
WBC: 7.2 K/uL (ref 4.0–10.5)

## 2024-07-19 LAB — HEMOGLOBIN A1C: Hgb A1c MFr Bld: 6 % (ref 4.6–6.5)

## 2024-07-19 LAB — IBC PANEL
Iron: 67 ug/dL (ref 42–145)
Saturation Ratios: 14.6 % — ABNORMAL LOW (ref 20.0–50.0)
TIBC: 459.2 ug/dL — ABNORMAL HIGH (ref 250.0–450.0)
Transferrin: 328 mg/dL (ref 212.0–360.0)

## 2024-07-19 NOTE — Assessment & Plan Note (Signed)
 Chronic  Clinically euthyroid-experiencing some cold intolerance which I think is not her thyroid  more likely just to be cold Check tsh  Continue levothyroxine  50 mcg daily

## 2024-07-19 NOTE — Assessment & Plan Note (Signed)
 New For the past few months she has been experiencing more heavy bleeding-her gynecologist thinks she may have been perimenopause This could be causing some anemia, iron  deficiency that could be contributing to some of her symptoms CBC, iron  panel

## 2024-07-19 NOTE — Progress Notes (Signed)
 "   Subjective:    Patient ID: Mindy Bright, female    DOB: 21-May-1981, 44 y.o.   MRN: [id number]      HPI Mindy Bright is here for  Chief Complaint  Patient presents with   Cold Feet    Patient has had cold feet since November (wondering if she has circulation issues or deficiency )    Discussed the use of AI scribe software for clinical note transcription with the patient, who gave verbal consent to proceed.  History of Present Illness Mindy Bright is a 44 year old female with prediabetes who presents with a cold sensation in her feet.  She has been experiencing a cold sensation in her feet for the past couple of months, described as feeling 'really cold,' although she is not always cold to the touch. No numbness, tingling, or pain in her feet, but she occasionally experiences heel pain, which she attributes to wearing unsupportive shoes. The cold sensation has been more noticeable over the past three days.  Her hands also tend to stay cold, a condition she has experienced for a long time. No color changes in her extremities and no swelling in her legs or ankles. The cold sensation in her feet began after a trip to [place], where she was on her feet for extended periods while shopping.  She has a history of borderline blood sugar levels and is concerned about her prediabetes, especially given her family history of diabetes-related complications, including three aunts and uncles who lost legs due to diabetes. Her last A1c was 6.3, indicating prediabetes. She is actively trying to manage her sugar intake by limiting her carbohydrate consumption to 50 grams per meal and increasing her protein intake. She reports cravings for sugar and has been attempting to reduce her sugar consumption, although she finds it challenging.  She has been monitoring her blood sugar at home using her mother's old glucometer, with recent fasting readings around 89 mg/dL. She checks her blood sugar two hours  after meals, with a recent reading of 120 mg/dL after eating pancakes.       Medications and allergies reviewed with patient and updated if appropriate.  Medications Ordered Prior to Encounter[1]  Review of Systems  Cardiovascular:  Negative for leg swelling.  Skin:  Negative for color change.  Neurological:  Negative for numbness.       Objective:   Vitals:   07/19/24 0806  BP: 102/60  Pulse: 72  Temp: 98 F (36.7 C)  SpO2: 97%   BP Readings from Last 3 Encounters:  07/19/24 102/60  05/02/24 109/72  01/19/24 104/72   Wt Readings from Last 3 Encounters:  07/19/24 115 lb (52.2 kg)  05/02/24 113 lb (51.3 kg)  01/19/24 113 lb (51.3 kg)   Body mass index is 21.03 kg/m.    Physical Exam Constitutional:      General: She is not in acute distress.    Appearance: Normal appearance. She is not ill-appearing.  HENT:     Head: Normocephalic and atraumatic.  Cardiovascular:     Comments: Normal pulses DP and PT b/l feet, normal radial pulses b/l.  Normal skin temperature of feet and fingers Musculoskeletal:     Right lower leg: No edema.     Left lower leg: No edema.  Skin:    General: Skin is warm and dry.     Findings: No bruising, erythema or rash.  Neurological:     Mental Status: She is alert.  Assessment & Plan:    See Problem List for Assessment and Plan of chronic medical problems.         [1]  Current Outpatient Medications on File Prior to Visit  Medication Sig Dispense Refill   ALPRAZolam  (XANAX ) 0.25 MG tablet Take 1 tablet (0.25 mg total) by mouth daily as needed for anxiety. 10 tablet 0   doxycycline  (VIBRAMYCIN ) 100 MG capsule Take 100 mg by mouth 2 (two) times daily.     levocetirizine (XYZAL) 5 MG tablet Take 1 tablet by mouth daily.     levothyroxine  (SYNTHROID ) 50 MCG tablet TAKE 1 TABLET BY MOUTH EVERY DAY BEFORE BREAKFAST 90 tablet 1   Multiple Vitamin (MULTIVITAMIN) tablet Take 1 tablet by mouth daily.      ondansetron  (ZOFRAN -ODT) 4 MG disintegrating tablet Take 1 tablet (4 mg total) by mouth every 8 (eight) hours as needed for nausea or vomiting. 20 tablet 0   pantoprazole  (PROTONIX ) 20 MG tablet TAKE 1 TABLET(20 MG) BY MOUTH DAILY 30 tablet 5   No current facility-administered medications on file prior to visit.   "

## 2024-07-19 NOTE — Patient Instructions (Addendum)
" ° ° °  Fasting normal sugar 70-99              Prediabetic sugar  100-125              Diabetic sugars 126 +  2 hours after a meal sugars should ideally be < 140    Blood work was ordered.       Medications changes include :   None     Return in about 6 months (around 01/16/2025) for Physical Exam.  "

## 2024-07-19 NOTE — Assessment & Plan Note (Signed)
 Chronic Lab Results  Component Value Date   HGBA1C 6.0 07/19/2024   Check a1c Has improved her diet-eating less sugar-continue to work on low sugar/carb diet Stressed regular exercise-active but not currently exercising

## 2024-07-20 ENCOUNTER — Ambulatory Visit: Payer: Self-pay | Admitting: Internal Medicine

## 2024-07-21 ENCOUNTER — Ambulatory Visit: Admitting: Internal Medicine

## 2024-07-22 ENCOUNTER — Other Ambulatory Visit: Payer: Self-pay | Admitting: Internal Medicine

## 2025-01-22 ENCOUNTER — Encounter: Admitting: Internal Medicine
